# Patient Record
Sex: Female | Born: 1965 | Race: Black or African American | Hispanic: No | Smoking: Current some day smoker
Health system: Southern US, Community
[De-identification: ages and names within clinical notes are randomized; demographics above are authoritative.]

## PROBLEM LIST (undated history)

## (undated) DIAGNOSIS — I10 Essential (primary) hypertension: Secondary | ICD-10-CM

## (undated) HISTORY — DX: Essential (primary) hypertension: I10

---

## 2003-09-11 ENCOUNTER — Emergency Department (HOSPITAL_COMMUNITY): Admission: EM | Admit: 2003-09-11 | Discharge: 2003-09-11 | Payer: Self-pay | Admitting: Internal Medicine

## 2004-06-29 ENCOUNTER — Emergency Department (HOSPITAL_COMMUNITY): Admission: EM | Admit: 2004-06-29 | Discharge: 2004-06-29 | Payer: Self-pay | Admitting: Emergency Medicine

## 2004-07-08 ENCOUNTER — Ambulatory Visit (HOSPITAL_COMMUNITY): Admission: RE | Admit: 2004-07-08 | Discharge: 2004-07-08 | Payer: Self-pay | Admitting: Family Medicine

## 2004-07-24 ENCOUNTER — Ambulatory Visit: Payer: Self-pay | Admitting: Family Medicine

## 2005-07-20 ENCOUNTER — Ambulatory Visit: Payer: Self-pay | Admitting: Family Medicine

## 2005-07-30 ENCOUNTER — Ambulatory Visit (HOSPITAL_COMMUNITY): Admission: RE | Admit: 2005-07-30 | Discharge: 2005-07-30 | Payer: Self-pay | Admitting: Family Medicine

## 2005-09-02 ENCOUNTER — Ambulatory Visit (HOSPITAL_COMMUNITY): Admission: RE | Admit: 2005-09-02 | Discharge: 2005-09-02 | Payer: Self-pay | Admitting: Family Medicine

## 2006-04-14 ENCOUNTER — Encounter: Payer: Self-pay | Admitting: Family Medicine

## 2006-04-14 LAB — CONVERTED CEMR LAB: Pap Smear: NORMAL

## 2006-05-06 ENCOUNTER — Encounter: Payer: Self-pay | Admitting: Family Medicine

## 2006-05-06 ENCOUNTER — Other Ambulatory Visit: Admission: RE | Admit: 2006-05-06 | Discharge: 2006-05-06 | Payer: Self-pay | Admitting: Family Medicine

## 2006-05-06 ENCOUNTER — Ambulatory Visit: Payer: Self-pay | Admitting: Family Medicine

## 2006-08-09 ENCOUNTER — Ambulatory Visit (HOSPITAL_COMMUNITY): Admission: RE | Admit: 2006-08-09 | Discharge: 2006-08-09 | Payer: Self-pay | Admitting: Family Medicine

## 2006-10-20 ENCOUNTER — Ambulatory Visit: Payer: Self-pay | Admitting: Family Medicine

## 2007-11-17 ENCOUNTER — Ambulatory Visit: Payer: Self-pay | Admitting: Family Medicine

## 2007-11-19 ENCOUNTER — Encounter: Payer: Self-pay | Admitting: Family Medicine

## 2007-11-25 ENCOUNTER — Ambulatory Visit (HOSPITAL_COMMUNITY): Admission: RE | Admit: 2007-11-25 | Discharge: 2007-11-25 | Payer: Self-pay | Admitting: Family Medicine

## 2007-11-26 ENCOUNTER — Encounter: Payer: Self-pay | Admitting: Family Medicine

## 2007-11-26 LAB — CONVERTED CEMR LAB
BUN: 13 mg/dL (ref 6–23)
Basophils Absolute: 0 10*3/uL (ref 0.0–0.1)
Basophils Relative: 1 % (ref 0–1)
CO2: 22 meq/L (ref 19–32)
Calcium: 8.8 mg/dL (ref 8.4–10.5)
Chloride: 104 meq/L (ref 96–112)
Cholesterol: 178 mg/dL (ref 0–200)
Creatinine, Ser: 1.25 mg/dL — ABNORMAL HIGH (ref 0.40–1.20)
Eosinophils Absolute: 0.3 10*3/uL (ref 0.0–0.7)
Eosinophils Relative: 5 % (ref 0–5)
Glucose, Bld: 90 mg/dL (ref 70–99)
HCT: 39.6 % (ref 36.0–46.0)
HDL: 73 mg/dL (ref >39)
Hemoglobin: 13.1 g/dL (ref 12.0–15.0)
LDL Cholesterol: 96 mg/dL (ref 0–99)
Lymphocytes Relative: 39 % (ref 12–46)
Lymphs Abs: 2.3 10*3/uL (ref 0.7–4.0)
MCHC: 33.1 g/dL (ref 30.0–36.0)
MCV: 92.7 fL (ref 78.0–100.0)
Monocytes Absolute: 0.4 10*3/uL (ref 0.1–1.0)
Monocytes Relative: 6 % (ref 3–12)
Neutro Abs: 2.8 10*3/uL (ref 1.7–7.7)
Neutrophils Relative %: 49 % (ref 43–77)
Platelets: 331 10*3/uL (ref 150–400)
Potassium: 4.1 meq/L (ref 3.5–5.3)
RBC: 4.27 M/uL (ref 3.87–5.11)
RDW: 13 % (ref 11.5–15.5)
Sodium: 137 meq/L (ref 135–145)
Total CHOL/HDL Ratio: 2.4
Triglycerides: 47 mg/dL (ref <150)
VLDL: 9 mg/dL (ref 0–40)
WBC: 5.8 10*3/uL (ref 4.0–10.5)

## 2007-12-12 ENCOUNTER — Ambulatory Visit: Payer: Self-pay | Admitting: Family Medicine

## 2007-12-12 ENCOUNTER — Encounter: Payer: Self-pay | Admitting: Family Medicine

## 2007-12-12 ENCOUNTER — Other Ambulatory Visit: Admission: RE | Admit: 2007-12-12 | Discharge: 2007-12-12 | Payer: Self-pay | Admitting: Family Medicine

## 2007-12-12 LAB — CONVERTED CEMR LAB: Pap Smear: NORMAL

## 2007-12-14 ENCOUNTER — Encounter: Payer: Self-pay | Admitting: Family Medicine

## 2007-12-14 LAB — CONVERTED CEMR LAB
Candida species: NEGATIVE
Chlamydia, DNA Probe: NEGATIVE
GC Probe Amp, Genital: NEGATIVE
Gardnerella vaginalis: POSITIVE — AB
Trichomonal Vaginitis: NEGATIVE

## 2007-12-16 ENCOUNTER — Encounter: Payer: Self-pay | Admitting: Family Medicine

## 2007-12-16 DIAGNOSIS — J329 Chronic sinusitis, unspecified: Secondary | ICD-10-CM | POA: Insufficient documentation

## 2007-12-16 DIAGNOSIS — I10 Essential (primary) hypertension: Secondary | ICD-10-CM | POA: Insufficient documentation

## 2008-01-24 ENCOUNTER — Ambulatory Visit (HOSPITAL_COMMUNITY): Admission: RE | Admit: 2008-01-24 | Discharge: 2008-01-24 | Payer: Self-pay | Admitting: General Surgery

## 2008-05-29 ENCOUNTER — Ambulatory Visit: Payer: Self-pay | Admitting: Family Medicine

## 2008-07-31 ENCOUNTER — Ambulatory Visit: Payer: Self-pay | Admitting: Family Medicine

## 2008-12-17 ENCOUNTER — Encounter: Payer: Self-pay | Admitting: Family Medicine

## 2008-12-18 ENCOUNTER — Encounter: Payer: Self-pay | Admitting: Family Medicine

## 2008-12-18 LAB — CONVERTED CEMR LAB
BUN: 11 mg/dL (ref 6–23)
Basophils Absolute: 0 10*3/uL (ref 0.0–0.1)
Basophils Relative: 0 % (ref 0–1)
CO2: 23 meq/L (ref 19–32)
Calcium: 8.5 mg/dL (ref 8.4–10.5)
Chloride: 109 meq/L (ref 96–112)
Cholesterol: 177 mg/dL (ref 0–200)
Creatinine, Ser: 0.93 mg/dL (ref 0.40–1.20)
Eosinophils Absolute: 0.3 10*3/uL (ref 0.0–0.7)
Eosinophils Relative: 4 % (ref 0–5)
Glucose, Bld: 94 mg/dL (ref 70–99)
HCT: 34.6 % — ABNORMAL LOW (ref 36.0–46.0)
HDL: 62 mg/dL (ref >39)
Hemoglobin: 11.1 g/dL — ABNORMAL LOW (ref 12.0–15.0)
LDL Cholesterol: 99 mg/dL (ref 0–99)
Lymphocytes Relative: 34 % (ref 12–46)
Lymphs Abs: 2.3 10*3/uL (ref 0.7–4.0)
MCHC: 32.1 g/dL (ref 30.0–36.0)
MCV: 96.4 fL (ref 78.0–100.0)
Monocytes Absolute: 0.5 10*3/uL (ref 0.1–1.0)
Monocytes Relative: 7 % (ref 3–12)
Neutro Abs: 3.7 10*3/uL (ref 1.7–7.7)
Neutrophils Relative %: 55 % (ref 43–77)
Platelets: 351 10*3/uL (ref 150–400)
Potassium: 3.9 meq/L (ref 3.5–5.3)
RBC: 3.59 M/uL — ABNORMAL LOW (ref 3.87–5.11)
RDW: 14.1 % (ref 11.5–15.5)
Retic Ct Pct: 2.4 % (ref 0.4–3.1)
Sodium: 142 meq/L (ref 135–145)
Total CHOL/HDL Ratio: 2.9
Triglycerides: 79 mg/dL (ref <150)
VLDL: 16 mg/dL (ref 0–40)
WBC: 6.7 10*3/uL (ref 4.0–10.5)

## 2009-01-01 ENCOUNTER — Ambulatory Visit (HOSPITAL_COMMUNITY): Admission: RE | Admit: 2009-01-01 | Discharge: 2009-01-01 | Payer: Self-pay | Admitting: Family Medicine

## 2009-01-01 ENCOUNTER — Ambulatory Visit: Payer: Self-pay | Admitting: Family Medicine

## 2009-01-01 ENCOUNTER — Other Ambulatory Visit: Admission: RE | Admit: 2009-01-01 | Discharge: 2009-01-01 | Payer: Self-pay | Admitting: Family Medicine

## 2009-01-01 ENCOUNTER — Encounter: Payer: Self-pay | Admitting: Family Medicine

## 2009-01-01 DIAGNOSIS — D259 Leiomyoma of uterus, unspecified: Secondary | ICD-10-CM | POA: Insufficient documentation

## 2009-01-01 DIAGNOSIS — D492 Neoplasm of unspecified behavior of bone, soft tissue, and skin: Secondary | ICD-10-CM | POA: Insufficient documentation

## 2009-01-01 DIAGNOSIS — N760 Acute vaginitis: Secondary | ICD-10-CM | POA: Insufficient documentation

## 2009-01-01 LAB — CONVERTED CEMR LAB: OCCULT 1: NEGATIVE

## 2009-01-02 ENCOUNTER — Encounter: Payer: Self-pay | Admitting: Family Medicine

## 2009-01-02 LAB — CONVERTED CEMR LAB
Chlamydia, DNA Probe: NEGATIVE
GC Probe Amp, Genital: NEGATIVE

## 2009-01-04 ENCOUNTER — Encounter: Payer: Self-pay | Admitting: Family Medicine

## 2009-01-04 LAB — CONVERTED CEMR LAB
Candida species: NEGATIVE
Gardnerella vaginalis: POSITIVE — AB
Trichomonal Vaginitis: NEGATIVE

## 2009-01-07 ENCOUNTER — Ambulatory Visit (HOSPITAL_COMMUNITY): Admission: RE | Admit: 2009-01-07 | Discharge: 2009-01-07 | Payer: Self-pay | Admitting: Family Medicine

## 2009-01-11 ENCOUNTER — Ambulatory Visit (HOSPITAL_COMMUNITY): Admission: RE | Admit: 2009-01-11 | Discharge: 2009-01-11 | Payer: Self-pay | Admitting: Family Medicine

## 2009-06-13 ENCOUNTER — Ambulatory Visit: Payer: Self-pay | Admitting: Family Medicine

## 2009-10-07 ENCOUNTER — Telehealth: Payer: Self-pay | Admitting: Family Medicine

## 2010-01-17 ENCOUNTER — Ambulatory Visit (HOSPITAL_COMMUNITY): Admission: RE | Admit: 2010-01-17 | Discharge: 2010-01-17 | Payer: Self-pay | Admitting: Family Medicine

## 2010-01-17 LAB — CONVERTED CEMR LAB
BUN: 9 mg/dL (ref 6–23)
Basophils Absolute: 0 10*3/uL (ref 0.0–0.1)
Basophils Relative: 1 % (ref 0–1)
CO2: 25 meq/L (ref 19–32)
Calcium: 8.7 mg/dL (ref 8.4–10.5)
Chloride: 106 meq/L (ref 96–112)
Cholesterol: 176 mg/dL (ref 0–200)
Creatinine, Ser: 0.96 mg/dL (ref 0.40–1.20)
Eosinophils Absolute: 0.2 10*3/uL (ref 0.0–0.7)
Eosinophils Relative: 3 % (ref 0–5)
Glucose, Bld: 91 mg/dL (ref 70–99)
HCT: 35.7 % — ABNORMAL LOW (ref 36.0–46.0)
HDL: 65 mg/dL (ref >39)
Hemoglobin: 11.6 g/dL — ABNORMAL LOW (ref 12.0–15.0)
LDL Cholesterol: 102 mg/dL — ABNORMAL HIGH (ref 0–99)
Lymphocytes Relative: 41 % (ref 12–46)
Lymphs Abs: 2.4 10*3/uL (ref 0.7–4.0)
MCHC: 32.5 g/dL (ref 30.0–36.0)
MCV: 93.9 fL (ref 78.0–100.0)
Monocytes Absolute: 0.4 10*3/uL (ref 0.1–1.0)
Monocytes Relative: 6 % (ref 3–12)
Neutro Abs: 2.9 10*3/uL (ref 1.7–7.7)
Neutrophils Relative %: 49 % (ref 43–77)
Platelets: 383 10*3/uL (ref 150–400)
Potassium: 3.6 meq/L (ref 3.5–5.3)
RBC: 3.8 M/uL — ABNORMAL LOW (ref 3.87–5.11)
RDW: 13.4 % (ref 11.5–15.5)
Sodium: 140 meq/L (ref 135–145)
Total CHOL/HDL Ratio: 2.7
Triglycerides: 47 mg/dL (ref <150)
VLDL: 9 mg/dL (ref 0–40)
WBC: 5.9 10*3/uL (ref 4.0–10.5)

## 2010-02-06 ENCOUNTER — Other Ambulatory Visit: Admission: RE | Admit: 2010-02-06 | Discharge: 2010-02-06 | Payer: Self-pay | Admitting: Family Medicine

## 2010-02-06 ENCOUNTER — Ambulatory Visit: Payer: Self-pay | Admitting: Family Medicine

## 2010-02-06 DIAGNOSIS — E669 Obesity, unspecified: Secondary | ICD-10-CM | POA: Insufficient documentation

## 2010-02-06 DIAGNOSIS — R5381 Other malaise: Secondary | ICD-10-CM | POA: Insufficient documentation

## 2010-02-06 DIAGNOSIS — R5383 Other fatigue: Secondary | ICD-10-CM

## 2010-02-06 LAB — CONVERTED CEMR LAB: Pap Smear: NEGATIVE

## 2010-03-18 IMAGING — US US PELVIS COMPLETE MODIFY
1 series · 14 of 25 positions shown · non-contrast
Comparison: None

CLINICAL DATA: Fibroid.  LMP 01/06/2009

TRANSABDOMINAL AND TRANSVAGINAL ULTRASOUND OF PELVIS
TECHNIQUE: Both transabdominal and transvaginal ultrasound
examinations of the pelvis were performed including evaluation of
the uterus, ovaries, adnexal regions, and pelvic cul-de-sac.

[Series 1: us pelvis complete modify · 0.20mm/px · 14 of 63 slices shown]
[im 1/63]
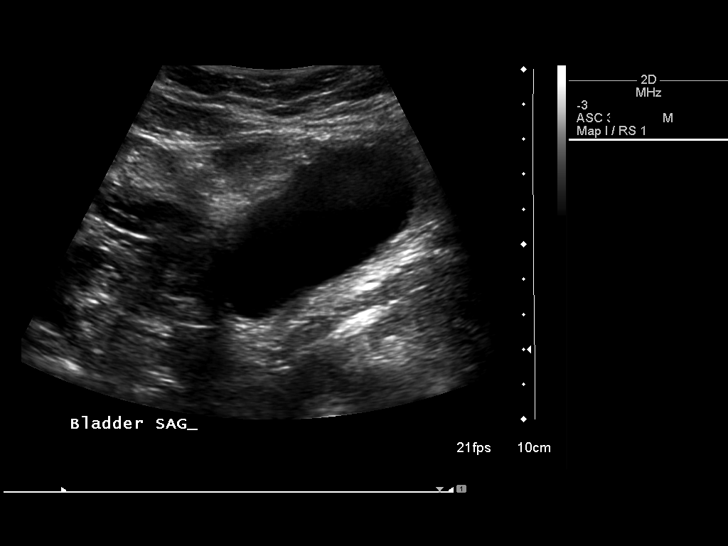
[im 6/63]
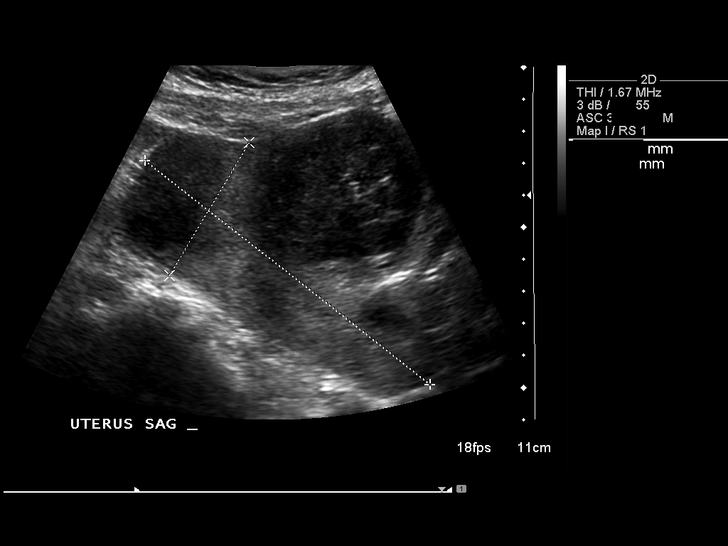
[im 11/63]
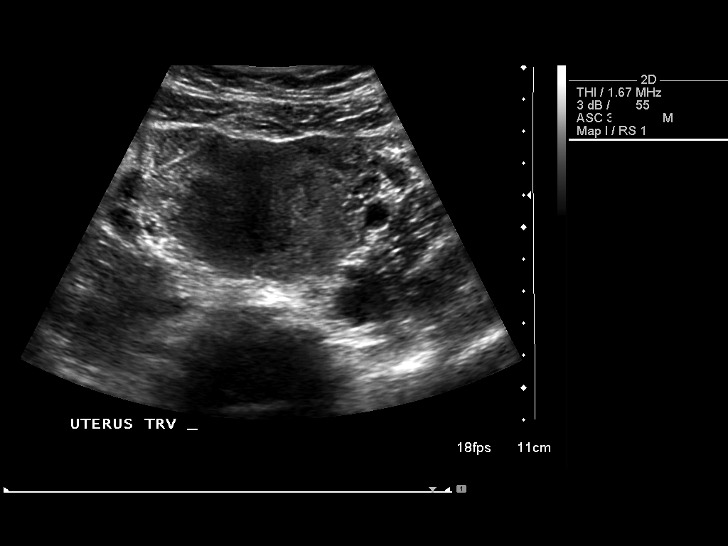
[im 16/63]
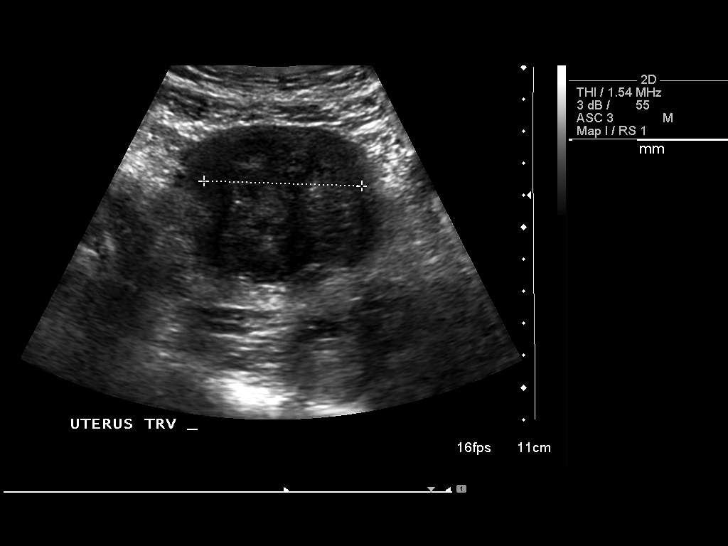
[im 21/63]
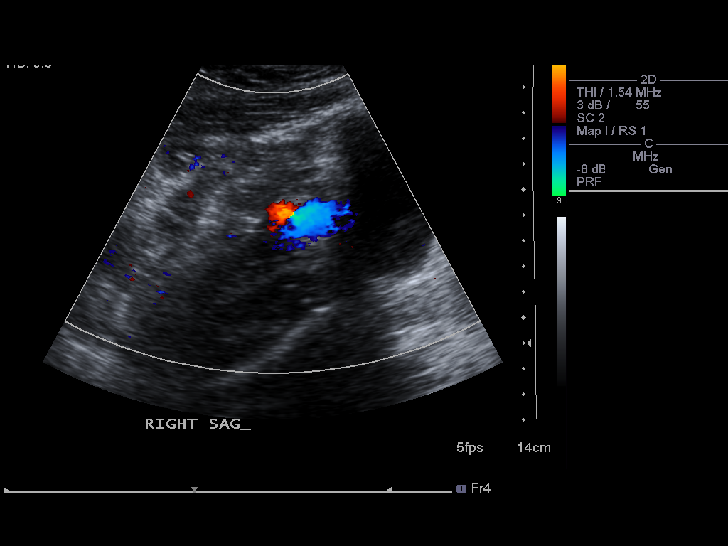
[im 24/63]
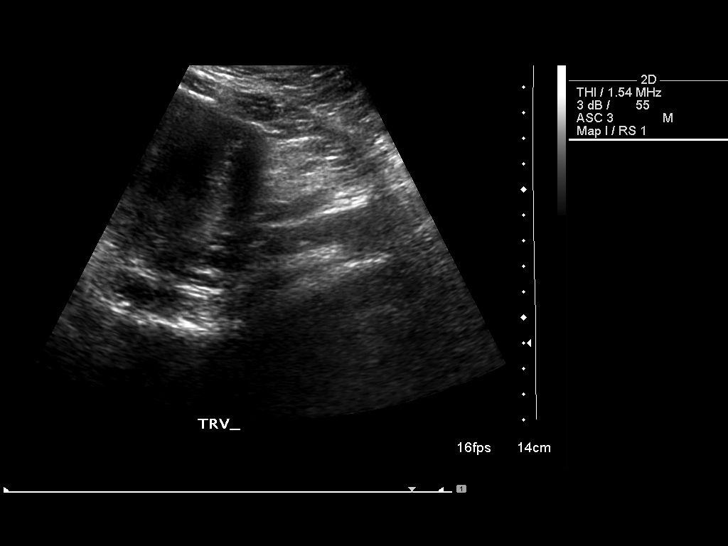
[im 29/63]
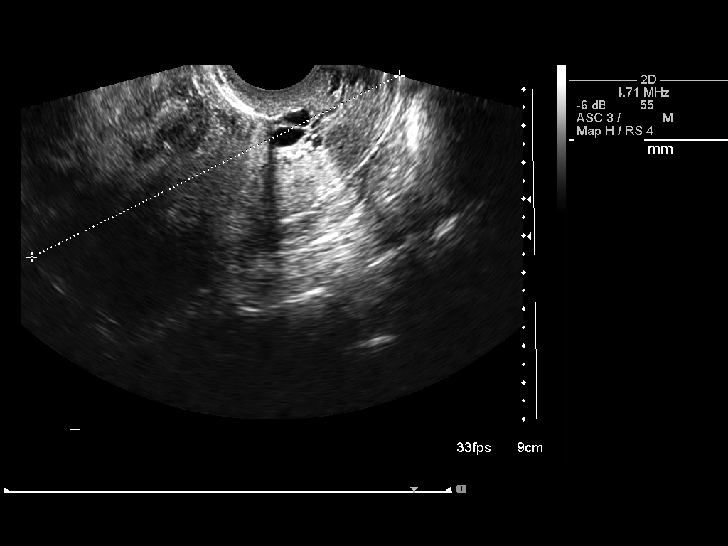
[im 34/63]
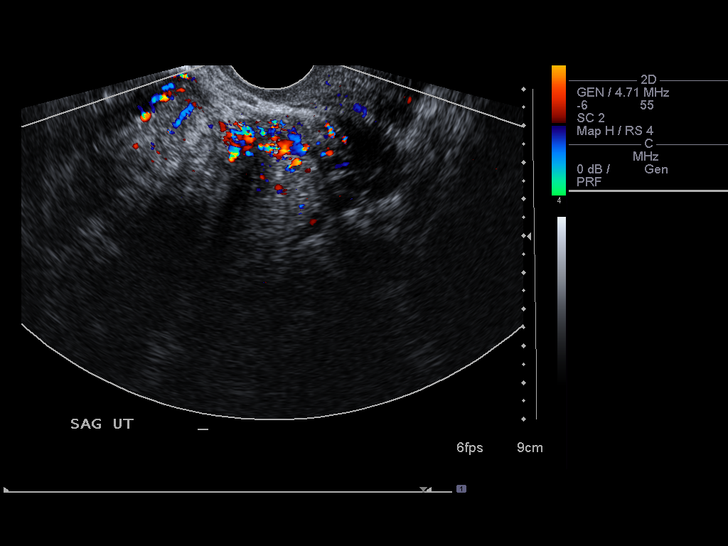
[im 39/63]
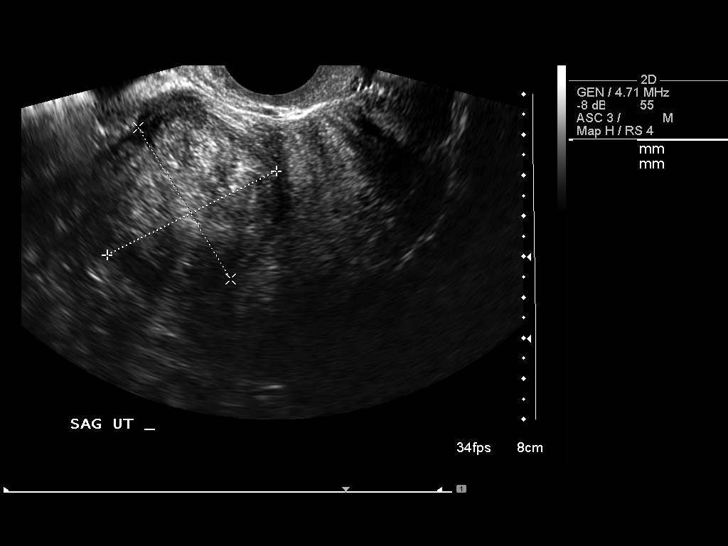
[im 42/63]
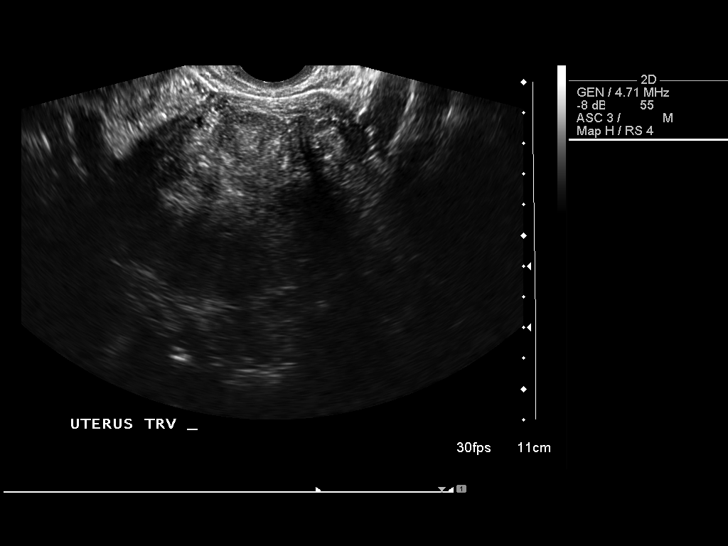
[im 47/63]
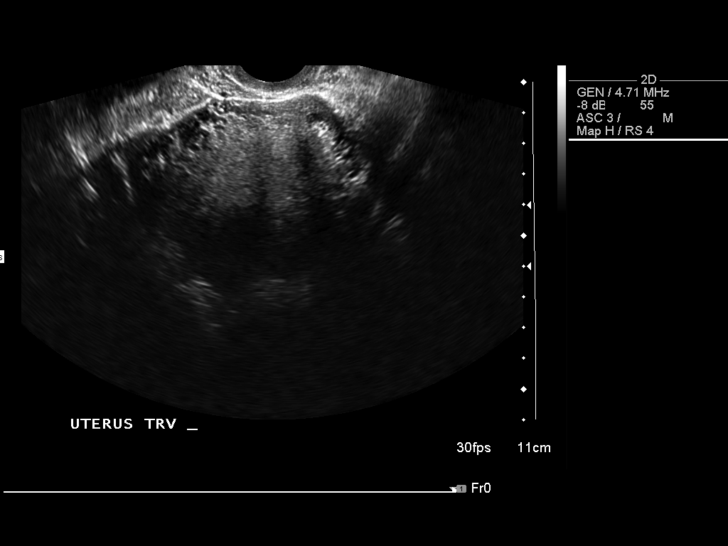
[im 52/63]
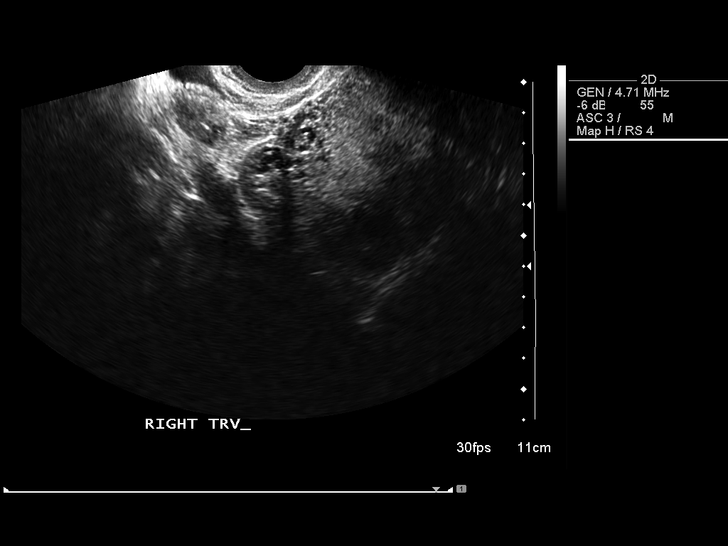
[im 57/63]
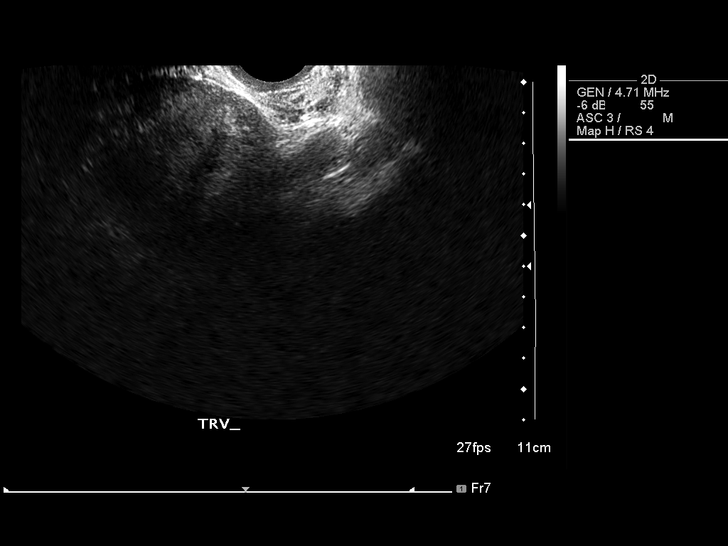
[im 63/63]
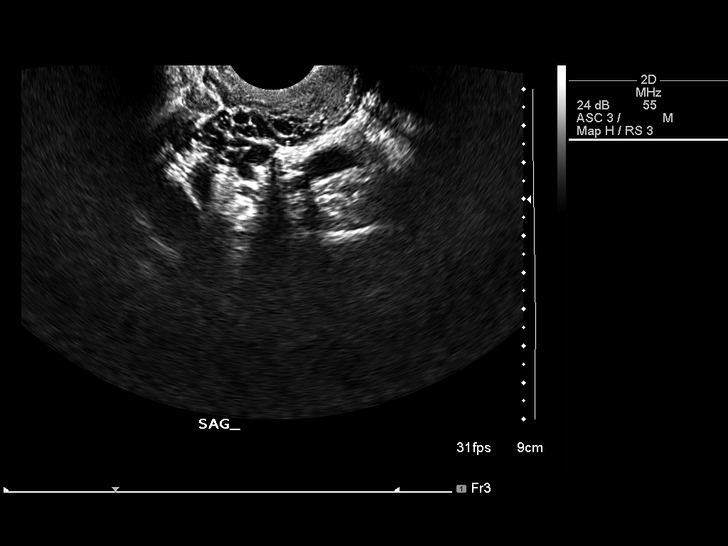

[14 of 25 positions shown; findings below may reference images not displayed]

FINDINGS: Uterus:  The uterus is 11.2 x 8.1 x 8.5 cm.  Anterior fibroid is
4.6 x 4.4 by 4.8 cm.

Endometrium:  The endometrium has a normal appearance and measures
1.3 mm.

Right Ovary:  The right ovary is not seen.

Left Ovary:  The left ovary is not seen.

Other Findings:  No free pelvic fluid is identified.
IMPRESSION: Anterior uterine fibroid.  Non-visualized ovaries.

## 2010-03-25 ENCOUNTER — Ambulatory Visit: Payer: Self-pay | Admitting: Family Medicine

## 2010-03-25 DIAGNOSIS — N3000 Acute cystitis without hematuria: Secondary | ICD-10-CM | POA: Insufficient documentation

## 2010-03-25 LAB — CONVERTED CEMR LAB
Bilirubin Urine: NEGATIVE
Glucose, Urine, Semiquant: NEGATIVE
Nitrite: NEGATIVE
Protein, U semiquant: >=300
Specific Gravity, Urine: 1.025
Urobilinogen, UA: 0.2
pH: 7

## 2010-03-27 ENCOUNTER — Encounter: Payer: Self-pay | Admitting: Family Medicine

## 2010-07-31 ENCOUNTER — Ambulatory Visit: Payer: Self-pay | Admitting: Family Medicine

## 2010-08-01 LAB — CONVERTED CEMR LAB
BUN: 13 mg/dL (ref 6–23)
CO2: 25 meq/L (ref 19–32)
Calcium: 9.3 mg/dL (ref 8.4–10.5)
Chloride: 106 meq/L (ref 96–112)
Creatinine, Ser: 0.91 mg/dL (ref 0.40–1.20)
Glucose, Bld: 88 mg/dL (ref 70–99)
Hgb A1c MFr Bld: 5.8 % — ABNORMAL HIGH (ref <5.7)
Potassium: 3.8 meq/L (ref 3.5–5.3)
Sodium: 139 meq/L (ref 135–145)

## 2010-10-02 ENCOUNTER — Ambulatory Visit
Admission: RE | Admit: 2010-10-02 | Discharge: 2010-10-02 | Payer: Self-pay | Source: Home / Self Care | Attending: Family Medicine | Admitting: Family Medicine

## 2010-10-05 ENCOUNTER — Encounter: Payer: Self-pay | Admitting: Family Medicine

## 2010-10-14 NOTE — Assessment & Plan Note (Signed)
Summary: office visit   Vital Signs:  Patient profile:   45 year old female Menstrual status:  regular Height:      64.5 inches Weight:      176 pounds BMI:     29.85 O2 Sat:      99 % Pulse rate:   66 / minute Pulse rhythm:   regular Resp:     16 per minute BP sitting:   134 / 80  (left arm) Cuff size:   regular  Vitals Entered By: Everitt Amber LPN (Feb 06, 2010 2:23 PM)  Nutrition Counseling: Patient's BMI is greater than 25 and therefore counseled on weight management options. CC: her allergies are making her itch on her back legs and arms off and on. Doesn't know if its pollen or something she's allergic too   CC:  her allergies are making her itch on her back legs and arms off and on. Doesn't know if its pollen or something she's allergic too.  History of Present Illness: Reports  that she ahs been doing well.She does feel overworked, and has not commited time to regular exercise, she still snacks on junkfood and has gained weight over the past several months. Denies recent fever or chills. Denies sinus pressure, nasal congestion , ear pain or sore throat. Denies chest congestion, or cough productive of sputum. Denies chest pain, palpitations, PND, orthopnea or leg swelling. Denies abdominal pain, nausea, vomitting, diarrhea or constipation. Denies change in bowel movements or bloody stool. Denies dysuria , frequency, incontinence or hesitancy. Denies  joint pain, swelling, or reduced mobility. Denies headaches, vertigo, seizures. Denies depression, anxiety or insomnia. Denies  rash or lesions, but reports generalised puritus when she goes outdoors, fweels this allergy based. Has used benadryl intermittently , with stmptom relief.   Allergies: No Known Drug Allergies  Review of Systems      See HPI General:  Complains of fatigue. Eyes:  Denies blurring and discharge. Derm:  Complains of itching; denies changes in color of skin, changes in nail beds, dryness,  lesion(s), and rash. Psych:  Denies anxiety and depression. Endo:  Denies cold intolerance, excessive thirst, excessive urination, and heat intolerance. Heme:  Denies abnormal bruising and bleeding. Allergy:  Denies hives or rash and itching eyes.  Physical Exam  General:  Well-developed,well-nourished,in no acute distress; alert,appropriate and cooperative throughout examination Head:  Normocephalic and atraumatic without obvious abnormalities. No apparent alopecia or balding. Eyes:  No corneal or conjunctival inflammation noted. EOMI. Perrla. Funduscopic exam benign, without hemorrhages, exudates or papilledema. Vision grossly normal. Ears:  External ear exam shows no significant lesions or deformities.  Otoscopic examination reveals clear canals, tympanic membranes are intact bilaterally without bulging, retraction, inflammation or discharge. Hearing is grossly normal bilaterally. Nose:  External nasal examination shows no deformity or inflammation. Nasal mucosa are pink and moist without lesions or exudates. Mouth:  Oral mucosa and oropharynx without lesions or exudates.  Teeth in good repair. Neck:  No deformities, masses, or tenderness noted. Chest Wall:  No deformities, masses, or tenderness noted. Breasts:  No mass, nodules, thickening, tenderness, bulging, retraction, inflamation, nipple discharge or skin changes noted.   Lungs:  Normal respiratory effort, chest expands symmetrically. Lungs are clear to auscultation, no crackles or wheezes. Heart:  Normal rate and regular rhythm. S1 and S2 normal without gallop, murmur, click, rub or other extra sounds. Abdomen:  Bowel sounds positive,abdomen soft and non-tender without masses, organomegaly or hernias noted. Rectal:  No external abnormalities noted. Normal sphincter tone.  No rectal masses or tenderness.Vault empty Genitalia:  Normal introitus for age, no external lesions, no vaginal discharge, mucosa pink and moist, no vaginal or  cervical lesions, no vaginal atrophy, no friaility or hemorrhage, normal uterus size and position, no adnexal masses or tenderness Msk:  No deformity or scoliosis noted of thoracic or lumbar spine.   Pulses:  R and L carotid,radial,femoral,dorsalis pedis and posterior tibial pulses are full and equal bilaterally Extremities:  No clubbing, cyanosis, edema, or deformity noted with normal full range of motion of all joints.   Neurologic:  No cranial nerve deficits noted. Station and gait are normal. Plantar reflexes are down-going bilaterally. DTRs are symmetrical throughout. Sensory, motor and coordinative functions appear intact. Skin:  Intact without suspicious lesions or rashes Cervical Nodes:  No lymphadenopathy noted Axillary Nodes:  No palpable lymphadenopathy Inguinal Nodes:  No significant adenopathy Psych:  Cognition and judgment appear intact. Alert and cooperative with normal attention span and concentration. No apparent delusions, illusions, hallucinations   Impression & Recommendations:  Problem # 1:  HYPERTENSION (ICD-401.9) Assessment Deteriorated  Her updated medication list for this problem includes:    Maxzide-25 37.5-25 Mg Tabs (Triamterene-hctz) .Marland Kitchen... Take 1 tablet by mouth once a day  Orders: T-Basic Metabolic Panel [id number])  BP today: 134/80 Prior BP: 110/70 (06/13/2009)  Labs Reviewed: K+: 3.6 (01/17/2010) Creat: : 0.96 (01/17/2010)   Chol: 176 (01/17/2010)   HDL: 65 (01/17/2010)   LDL: 102 (01/17/2010)   TG: 47 (01/17/2010)  Problem # 2:  OVERWEIGHT (ICD-278.02) Assessment: Deteriorated  Ht: 64.5 (02/06/2010)   Wt: 176 (02/06/2010)   BMI: 29.85 (02/06/2010)  Problem # 3:  SPECIAL SCREENING FOR MALIGNANT NEOPLASMS COLON (ICD-V76.51) Assessment: Comment Only pt toreturn stool cards, rectal vaul;t wasempty  Complete Medication List: 1)  Maxzide-25 37.5-25 Mg Tabs (Triamterene-hctz) .... Take 1 tablet by mouth once a day  Other Orders: T-Lipid Profile  ([id number]) T-CBC w/Diff [phone]) T-TSH [phone]) T-Vitamin D (25-Hydroxy) [phone]) Pap Smear (02725)  Patient Instructions: 1)  F/U in 5 months and 3 weeks 2)  BMP prior to visit, ICD-9: 3)  Lipid Panel prior to visit, ICD-9:  fasting in 5.5 months 4)  CBC w/ Diff prior to visit, ICD-9: and anemia panel. 5)  vitamin D 6)  TSH  7)  Pls start oTC vitamin B12 one tab daily, also ensure you take one multivitamin dailyl 8)  It is important that you exercise regularly at least 20 minutes 5 times a week. If you develop chest pain, have severe difficulty breathing, or feel very tired , stop exercising immediately and seek medical attention. 9)  You need to lose weight. Consider a lower calorie diet and regular exercise.  Prescriptions: MAXZIDE-25 37.5-25 MG TABS (TRIAMTERENE-HCTZ) Take 1 tablet by mouth once a day  #30 x 4   Entered by:   Everitt Amber LPN   Authorized by:   Syliva Overman MD   Signed by:   Everitt Amber LPN on 36/64/4034   Method used:   [facility] to        [facility] [phone]* (retail)       [address]       [place], [place]  [address]       Ph: [phone] or [phone]       Fax: [phone]   RxID:   [id number]

## 2010-10-14 NOTE — Assessment & Plan Note (Signed)
Summary: uti?- room 1   Vital Signs:  Patient profile:   45 year old female Menstrual status:  regular Height:      64.5 inches Weight:      167.75 pounds BMI:     28.45 O2 Sat:      100 % on Room air Pulse rate:   89 / minute Resp:     16 per minute BP sitting:   110 / 70  (left arm)  Vitals Entered By: Adella Hare LPN (March 25, 2010 3:41 PM) CC: burning with urination, increased frequency Is Patient Diabetic? No Pain Assessment Patient in pain? no      Comments did not bring meds to ov   CC:  burning with urination and increased frequency.  History of Present Illness: Pt states since she awoke this am she has developed urinary freq and burning.  Worsening as the day goes on.  No fever or chills.  She has been getting some intermittent discomfort in her lower abd too.  No back pain. No hx of recurrent UTI's.  Hx of htn. Taking medications as prescribed. No headache, chest pain or palpitations.   Allergies (verified): No Known Drug Allergies  Past History:  Past medical history reviewed for relevance to current acute and chronic problems.  Past Medical History: Reviewed history from 12/16/2007 and no changes required. Current Problems:  OBESITY (ICD-278.00) SINUSITIS (ICD-473.9) HYPERTENSION (ICD-401.9)  Review of Systems General:  Denies chills and fever. CV:  Denies chest pain or discomfort and palpitations. Resp:  Denies shortness of breath. GI:  Complains of abdominal pain; denies change in bowel habits, nausea, and vomiting. GU:  Complains of dysuria and urinary frequency. MS:  Denies low back pain and mid back pain.  Physical Exam  General:  Well-developed,well-nourished,in no acute distress; alert,appropriate and cooperative throughout examination Head:  Normocephalic and atraumatic without obvious abnormalities. No apparent alopecia or balding. Ears:  External ear exam shows no significant lesions or deformities.  Otoscopic examination reveals  clear canals, tympanic membranes are intact bilaterally without bulging, retraction, inflammation or discharge. Hearing is grossly normal bilaterally. Nose:  External nasal examination shows no deformity or inflammation. Nasal mucosa are pink and moist without lesions or exudates. Mouth:  Oral mucosa and oropharynx without lesions or exudates.   Neck:  No deformities, masses, or tenderness noted. Lungs:  Normal respiratory effort, chest expands symmetrically. Lungs are clear to auscultation, no crackles or wheezes. Heart:  Normal rate and regular rhythm. S1 and S2 normal without gallop, murmur, click, rub or other extra sounds. Abdomen:  soft, no masses, no guarding, and no rigidity.  TTP suprapubic. Msk:  Neg [place] Cervical Nodes:  No lymphadenopathy noted Psych:  Cognition and judgment appear intact. Alert and cooperative with normal attention span and concentration. No apparent delusions, illusions, hallucinations   Impression & Recommendations:  Problem # 1:  CYSTITIS, ACUTE (ICD-595.0) Assessment New  Her updated medication list for this problem includes:    Sulfamethoxazole-tmp Ds 800-160 Mg Tabs (Sulfamethoxazole-trimethoprim) .Marland Kitchen... Take 1 two times a day x 3 days    Phenazopyridine Hcl 100 Mg Tabs (Phenazopyridine hcl) .Marland Kitchen... Take 1 every 8 hrs as needed for burining with urination  Problem # 2:  HYPERTENSION (ICD-401.9) Assessment: Comment Only  Her updated medication list for this problem includes:    Maxzide-25 37.5-25 Mg Tabs (Triamterene-hctz) .Marland Kitchen... Take 1 tablet by mouth once a day  Complete Medication List: 1)  Maxzide-25 37.5-25 Mg Tabs (Triamterene-hctz) .... Take 1 tablet by mouth  once a day 2)  Sulfamethoxazole-tmp Ds 800-160 Mg Tabs (Sulfamethoxazole-trimethoprim) .... Take 1 two times a day x 3 days 3)  Phenazopyridine Hcl 100 Mg Tabs (Phenazopyridine hcl) .... Take 1 every 8 hrs as needed for burining with urination  Other Orders: Urinalysis  ([id number])  Patient Instructions: 1)  Keep your next appt.  We will see you sooner if your syptoms dont improve or worsen. 2)  I have prescribed an antibiotic for your urinary infection and a medication to help with the burning. 3)  Increase your fluids. Prescriptions: PHENAZOPYRIDINE HCL 100 MG TABS (PHENAZOPYRIDINE HCL) take 1 every 8 hrs as needed for burining with urination  #6 x 0   Entered and Authorized by:   Esperanza Sheets PA   Signed by:   Esperanza Sheets PA on 03/25/2010   Method used:   Electronically to        [facility]. [phone]* (retail)       [address]       [place], [place]  [address]       Ph: [phone] or [phone]       Fax: [phone]   RxID:   [id number] SULFAMETHOXAZOLE-TMP DS 800-160 MG TABS (SULFAMETHOXAZOLE-TRIMETHOPRIM) take 1 two times a day x 3 days  #6 x 0   Entered and Authorized by:   Esperanza Sheets PA   Signed by:   Esperanza Sheets PA on 03/25/2010   Method used:   Electronically to        [facility]. [phone]* (retail)       [address]       [place], [place]  [address]       Ph: [phone] or [phone]       Fax: [phone]   RxID:   [id number]   Laboratory Results   Urine Tests  Date/Time Received: March 25, 2010 3:48 PM  Date/Time Reported: March 25, 2010 3:48 PM   Routine Urinalysis   Color: red Appearance: Cloudy Glucose: negative   (Normal Range: Negative) Bilirubin: negative   (Normal Range: Negative) Ketone: moderate (40)   (Normal Range: Negative) Spec. Gravity: 1.025   (Normal Range: 1.003-1.035) Blood: large   (Normal Range: Negative) pH: 7.0   (Normal Range: 5.0-8.0) Protein: >=300   (Normal Range: Negative) Urobilinogen: 0.2   (Normal Range: 0-1) Nitrite: negative   (Normal Range: Negative) Leukocyte Esterace: moderate   (Normal Range: Negative)       Appended Document: uti?- room 1

## 2010-10-14 NOTE — Assessment & Plan Note (Signed)
Summary: F UP   Vital Signs:  Patient profile:   45 year old female Menstrual status:  regular Height:      64.5 inches Weight:      171.75 pounds BMI:     29.13 O2 Sat:      99 % on Room air Pulse rate:   68 / minute Pulse rhythm:   regular Resp:     16 per minute BP sitting:   150 / 70  (right arm)  Vitals Entered By: Mauricia Area CMA (July 31, 2010 4:01 PM)  Nutrition Counseling: Patient's BMI is greater than 25 and therefore counseled on weight management options.  O2 Flow:  Room air CC: Follow up   CC:  Follow up.  History of Present Illness: Reports  that she has been doing well. She iss aware that she isnot exercising as she should and is also eating more junk food, with deterioration in her health Denies recent fever or chills. Denies sinus pressure, nasal congestion , ear pain or sore throat. Denies chest congestion, or cough productive of sputum. Denies chest pain, palpitations, PND, orthopnea or leg swelling. Denies abdominal pain, nausea, vomitting, diarrhea or constipation. Denies change in bowel movements or bloody stool. Denies dysuria , frequency, incontinence or hesitancy. Denies  joint pain, swelling, or reduced mobility. Denies headaches, vertigo, seizures. Denies depression, anxiety or insomnia. Denies  rash, lesions, or itch.     Current Medications (verified): 1)  Maxzide-25 37.5-25 Mg Tabs (Triamterene-Hctz) .... Take 1 Tablet By Mouth Once A Day  Allergies (verified): No Known Drug Allergies  Review of Systems      See HPI General:  Complains of fatigue. Eyes:  Denies discharge, eye pain, and red eye. Endo:  Denies cold intolerance, excessive thirst, excessive urination, and heat intolerance. Heme:  Denies abnormal bruising and bleeding. Allergy:  Denies hives or rash and itching eyes.  Physical Exam  General:  Well-developed,well-nourished,in no acute distress; alert,appropriate and cooperative throughout examination HEENT: No  facial asymmetry,  EOMI, No sinus tenderness, TM's Clear, oropharynx  pink and moist.   Chest: Clear to auscultation bilaterally.  CVS: S1, S2, No murmurs, No S3.   Abd: Soft, Nontender.  MS: Adequate ROM spine, hips, shoulders and knees.  Ext: No edema.   CNS: CN 2-12 intact, power tone and sensation normal throughout.   Skin: Intact, no visible lesions or rashes.  Psych: Good eye contact, normal affect.  Memory intact, not anxious or depressed appearing.    Impression & Recommendations:  Problem # 1:  OVERWEIGHT (ICD-278.02) Assessment Deteriorated  Ht: 64.5 (07/31/2010)   Wt: 171.75 (07/31/2010)   BMI: 29.13 (07/31/2010) therapeutic lifestyle change discussed and encouraged  Problem # 2:  HYPERTENSION (ICD-401.9) Assessment: Deteriorated  Her updated medication list for this problem includes:    Maxzide-25 37.5-25 Mg Tabs (Triamterene-hctz) .Marland Kitchen... Take 1 tablet by mouth once a day  Orders: T-Basic Metabolic Panel [phone])  BP today: 150/70 Prior BP: 110/70 (03/25/2010) Patient advised to follow low sodium diet rich in fruit and vegetables, and to commit to at least 30 minutes 5 days per week of regular exercise , to improve blood presure control.   Labs Reviewed: K+: 3.6 (01/17/2010) Creat: : 0.96 (01/17/2010)   Chol: 176 (01/17/2010)   HDL: 65 (01/17/2010)   LDL: 102 (01/17/2010)   TG: 47 (01/17/2010)  Complete Medication List: 1)  Maxzide-25 37.5-25 Mg Tabs (Triamterene-hctz) .... Take 1 tablet by mouth once a day  Other Orders: T- Hemoglobin A1C ([id number])  Patient Instructions: 1)  Please schedule a follow-up appointment in 2 months months. 2)  It is important that you exercise regularly at least 20 minutes 5 times a week. If you develop chest pain, have severe difficulty breathing, or feel very tired , stop exercising immediately and seek medical attention. 3)  You need to lose weight. Consider a lower calorie diet and regular exercise.  4)  pLS increase  the amt of fruit and vegetables you eat and follow the DASH diet 5)  BMP prior to visit, ICD-9:   today 6)  HbgA1C prior to visit, ICD-9: 7)  bP ishigh today, 150/70, it it impt that you cut out junk foodand start eating more fruit and veg Prescriptions: MAXZIDE-25 37.5-25 MG TABS (TRIAMTERENE-HCTZ) Take 1 tablet by mouth once a day  #30 x 4   Entered by:   Adella Hare LPN   Authorized by:   Syliva Overman MD   Signed by:   Adella Hare LPN on 16/06/9603   Method used:   Electronically to        [facility]. [phone]* (retail)       [address]       [place], [place]  [address]       Ph: [phone] or [phone]       Fax: [phone]   RxID:   [id number]    Orders Added: 1)  Est. Patient Level IV [74259] 2)  T-Basic Metabolic Panel [[id number]] 3)  T- Hemoglobin A1C [83036-23375]

## 2010-10-14 NOTE — Progress Notes (Signed)
Summary: refill  Phone Note Call from Patient   Summary of Call: needs to get refill on her med. she was in last month [phone]  [phone] Initial call taken by: Rudene Anda,  October 07, 2009 10:59 AM  Follow-up for Phone Call        returned call, tried both numbers, no answer Follow-up by: Worthy Keeler LPN,  October 07, 2009 11:12 AM  Additional Follow-up for Phone Call Additional follow up Details #1::        Rx Called In Additional Follow-up by: Worthy Keeler LPN,  October 07, 2009 11:13 AM

## 2010-10-22 NOTE — Assessment & Plan Note (Signed)
Summary: f up   Vital Signs:  Patient profile:   45 year old female Menstrual status:  regular Height:      64.5 inches Weight:      174.25 pounds BMI:     29.55 O2 Sat:      98 % Pulse rate:   83 / minute Pulse rhythm:   regular Resp:     16 per minute BP sitting:   108 / 72  (left arm) Cuff size:   regular  Vitals Entered By: Everitt Amber LPN (October 02, 2010 4:04 PM)  Nutrition Counseling: Patient's BMI is greater than 25 and therefore counseled on weight management options. CC: Follow up chronic problems   CC:  Follow up chronic problems.  History of Present Illness: Reports  that she has been oing well. Denies recent fever or chills. Denies sinus pressure, nasal congestion , ear pain or sore throat. Denies chest congestion, or cough productive of sputum. Denies chest pain, palpitations, PND, orthopnea or leg swelling. Denies abdominal pain, nausea, vomitting, diarrhea or constipation. Denies change in bowel movements or bloody stool. Denies dysuria , frequency, incontinence or hesitancy. Denies  joint pain, swelling, or reduced mobility. Denies headaches, vertigo, seizures. Denies depression, anxiety or insomnia. Denies  rash, lesions, or itch.     Current Medications (verified): 1)  Maxzide-25 37.5-25 Mg Tabs (Triamterene-Hctz) .... Take 1 Tablet By Mouth Once A Day  Allergies (verified): No Known Drug Allergies  Review of Systems      See HPI Eyes:  Denies discharge, eye pain, and red eye. Endo:  Denies cold intolerance, excessive hunger, excessive thirst, excessive urination, and heat intolerance. Heme:  Denies abnormal bruising and bleeding. Allergy:  Denies hives or rash and itching eyes.  Physical Exam  General:  Well-developed,well-nourished,in no acute distress; alert,appropriate and cooperative throughout examination HEENT: No facial asymmetry,  EOMI, No sinus tenderness, TM's Clear, oropharynx  pink and moist.   Chest: Clear to auscultation  bilaterally.  CVS: S1, S2, No murmurs, No S3.   Abd: Soft, Nontender.  MS: Adequate ROM spine, hips, shoulders and knees.  Ext: No edema.   CNS: CN 2-12 intact, power tone and sensation normal throughout.   Skin: Intact, no visible lesions or rashes.  Psych: Good eye contact, normal affect.  Memory intact, not anxious or depressed appearing.    Impression & Recommendations:  Problem # 1:  OVERWEIGHT (ICD-278.02) Assessment Unchanged  Ht: 64.5 (10/02/2010)   Wt: 174.25 (10/02/2010)   BMI: 29.55 (10/02/2010) therapeutic lifestyle change discussed and encouraged  Problem # 2:  HYPERTENSION (ICD-401.9) Assessment: Improved  The following medications were removed from the medication list:    Maxzide-25 37.5-25 Mg Tabs (Triamterene-hctz) .Marland Kitchen... Take 1 tablet by mouth once a day Her updated medication list for this problem includes:    Triamterene-hctz 37.5-25 Mg Tabs (Triamterene-hctz) ..... One tab by mouth once daily  Orders: T-Basic Metabolic Panel [id number])  BP today: 108/72 Prior BP: 150/70 (07/31/2010)  Labs Reviewed: K+: 3.8 (07/31/2010) Creat: : 0.91 (07/31/2010)   Chol: 176 (01/17/2010)   HDL: 65 (01/17/2010)   LDL: 102 (01/17/2010)   TG: 47 (01/17/2010)  Complete Medication List: 1)  Triamterene-hctz 37.5-25 Mg Tabs (Triamterene-hctz) .... One tab by mouth once daily  Other Orders: T-CBC w/Diff [id number]) T- Hemoglobin A1C ([id number]) T-TSH [id number]) T-Lipid Profile ([id number])  Patient Instructions: 1)  CPE in  2)  may 27th or after 3)  It is important that you exercise regularly at least 40 minutes 5 times  a week. If you develop chest pain, have severe difficulty breathing, or feel very tired , stop exercising immediately and seek medical attention. 4)  You need to lose weight. Consider a lower calorie diet and regular exercise.  5)  CBC w/ Diff prior to visit, ICD-9: 6)  BMP prior to visit, ICD-9: 7)  Lipid Panel prior to visit, ICD-9:   fasting in May 8)  TSH prior to visit, ICD-9: 9)  HbgA1C prior to visit, ICD-9: 10)  Nop med changes at thistime Prescriptions: TRIAMTERENE-HCTZ 37.5-25 MG TABS (TRIAMTERENE-HCTZ) one tab by mouth once daily  #90 x 1   Entered by:   Adella Hare LPN   Authorized by:   Syliva Overman MD   Signed by:   Adella Hare LPN on 64/40/3474   Method used:   [facility]ronically to        [facility]. [phone]* (retail)       [address]       [place], [place]  [address]       Ph: [phone]       Fax: [phone]   RxID:   [id number]    Orders Added: 1)  Est. Patient Level IV [42706] 2)  T-CBC w/Diff [[id number]] 3)  T-Basic Metabolic Panel [[id number]] 4)  T- Hemoglobin A1C [83036-23375] 5)  T-TSH [[id number]] 6)  T-Lipid Profile [[id number]]

## 2011-01-27 ENCOUNTER — Other Ambulatory Visit: Payer: Self-pay | Admitting: Family Medicine

## 2011-01-27 DIAGNOSIS — Z139 Encounter for screening, unspecified: Secondary | ICD-10-CM

## 2011-01-27 NOTE — H&P (Signed)
NAME:  Grace Valdez, Grace Valdez                ACCOUNT NO.:  [id number]   MEDICAL RECORD NO.:  [id number]          PATIENT TYPE:  AMB   LOCATION:  DAY                           FACILITY:  [facility]   PHYSICIAN:  Dalia Heading, M.D.  DATE OF BIRTH:  12/06/1965   DATE OF ADMISSION:  DATE OF DISCHARGE:  LH                              HISTORY & PHYSICAL   CHIEF COMPLAINT:  Hematochezia.   HISTORY OF PRESENT ILLNESS:  The patient is a 45 year old black female  who was referred for endoscopic evaluation.  She needs a colonoscopy for  hematochezia.  She was known to have blood in her stools.  No abdominal  pain, weight loss, nausea, vomiting, diarrhea, constipation, or melena.  She has never had a colonoscopy.  There is no family history of colon  carcinoma.   PAST MEDICAL HISTORY:  Includes hypertension.   PAST SURGICAL HISTORY:  Unremarkable.   CURRENT MEDICATIONS:  Benicar.   ALLERGIES:  No known drug allergies.   REVIEW OF SYSTEMS:  Noncontributory.   PHYSICAL EXAMINATION:  GENERAL:  The patient is a well-developed and  well-nourished black female, in no acute distress.  LUNGS:  Clear to auscultation with equal breath sounds bilaterally.  HEART:  Reveals regular rate and rhythm without S3, S4, or murmurs.  ABDOMEN:  Soft, nontender, and nondistended.  No hepatosplenomegaly or  masses are noted.  RECTAL:  Deferred to the procedure.   IMPRESSION:  Hematochezia.   PLAN:  The patient is scheduled for a colonoscopy on Jan 24, 2008.  The  risks and benefits of the procedure including bleeding and perforation  were fully explained to the patient, gave informed consent.      Dalia Heading, M.D.  Electronically Signed     MAJ/MEDQ  D:  01/05/2008  T:  01/05/2008  Job:  [id number]   cc:   Milus Mallick. Lodema Hong, M.D.  Fax: [phone]

## 2011-01-30 ENCOUNTER — Ambulatory Visit (HOSPITAL_COMMUNITY)
Admission: RE | Admit: 2011-01-30 | Discharge: 2011-01-30 | Disposition: A | Discharge status: Home / Self Care | Payer: BC Managed Care – PPO | Source: Ambulatory Visit | Attending: Family Medicine | Admitting: Family Medicine

## 2011-01-30 DIAGNOSIS — Z139 Encounter for screening, unspecified: Secondary | ICD-10-CM

## 2011-01-30 DIAGNOSIS — Z1231 Encounter for screening mammogram for malignant neoplasm of breast: Secondary | ICD-10-CM | POA: Insufficient documentation

## 2011-03-28 IMAGING — MG MM DIGITAL SCREENING
4 series · 4 of 4 positions shown · non-contrast
Comparison: none

DG SCREEN MAMMOGRAM BILATERAL
Bilateral CC and MLO view(s) were taken.

DIGITAL SCREENING MAMMOGRAM WITH CAD:
The breast tissue is heterogeneously dense.  No masses or malignant type calcifications are 
identified.  Compared with prior studies.
Images were processed with CAD.

[L CC]
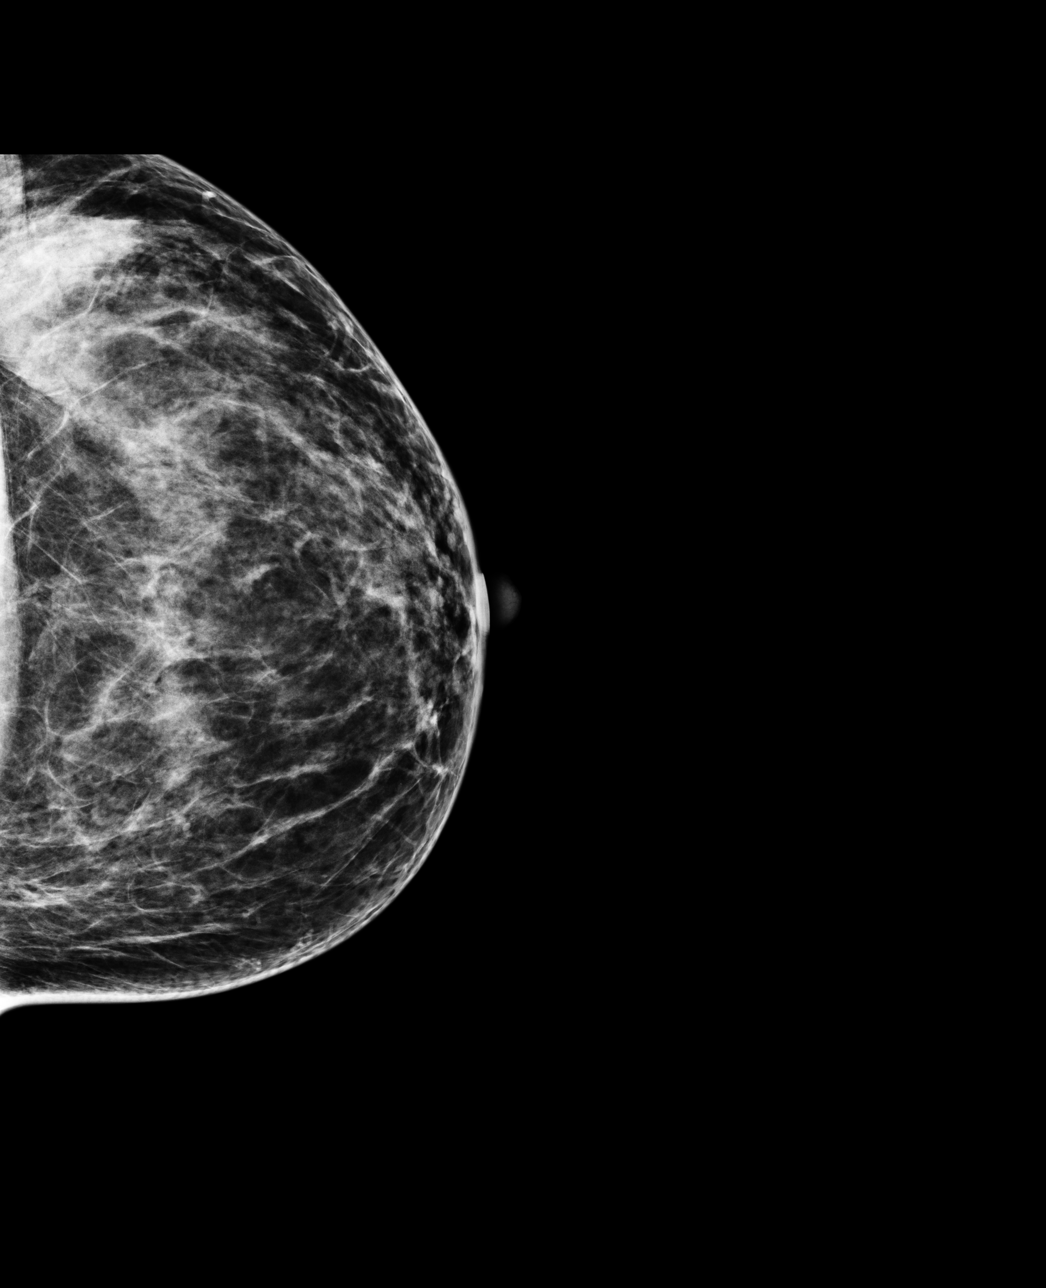

[L MLO]
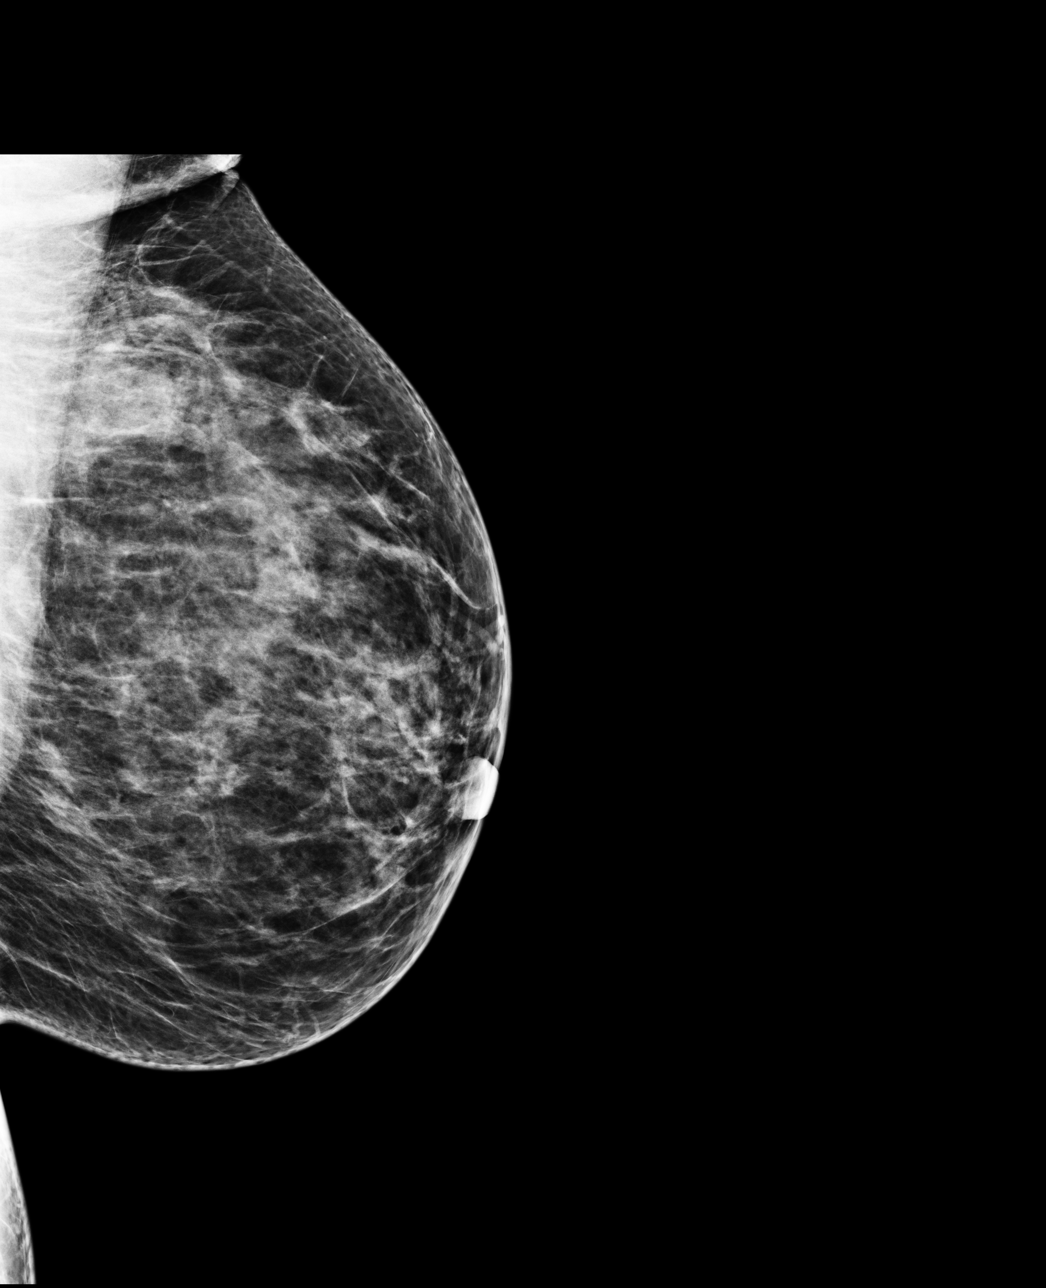

[R CC]
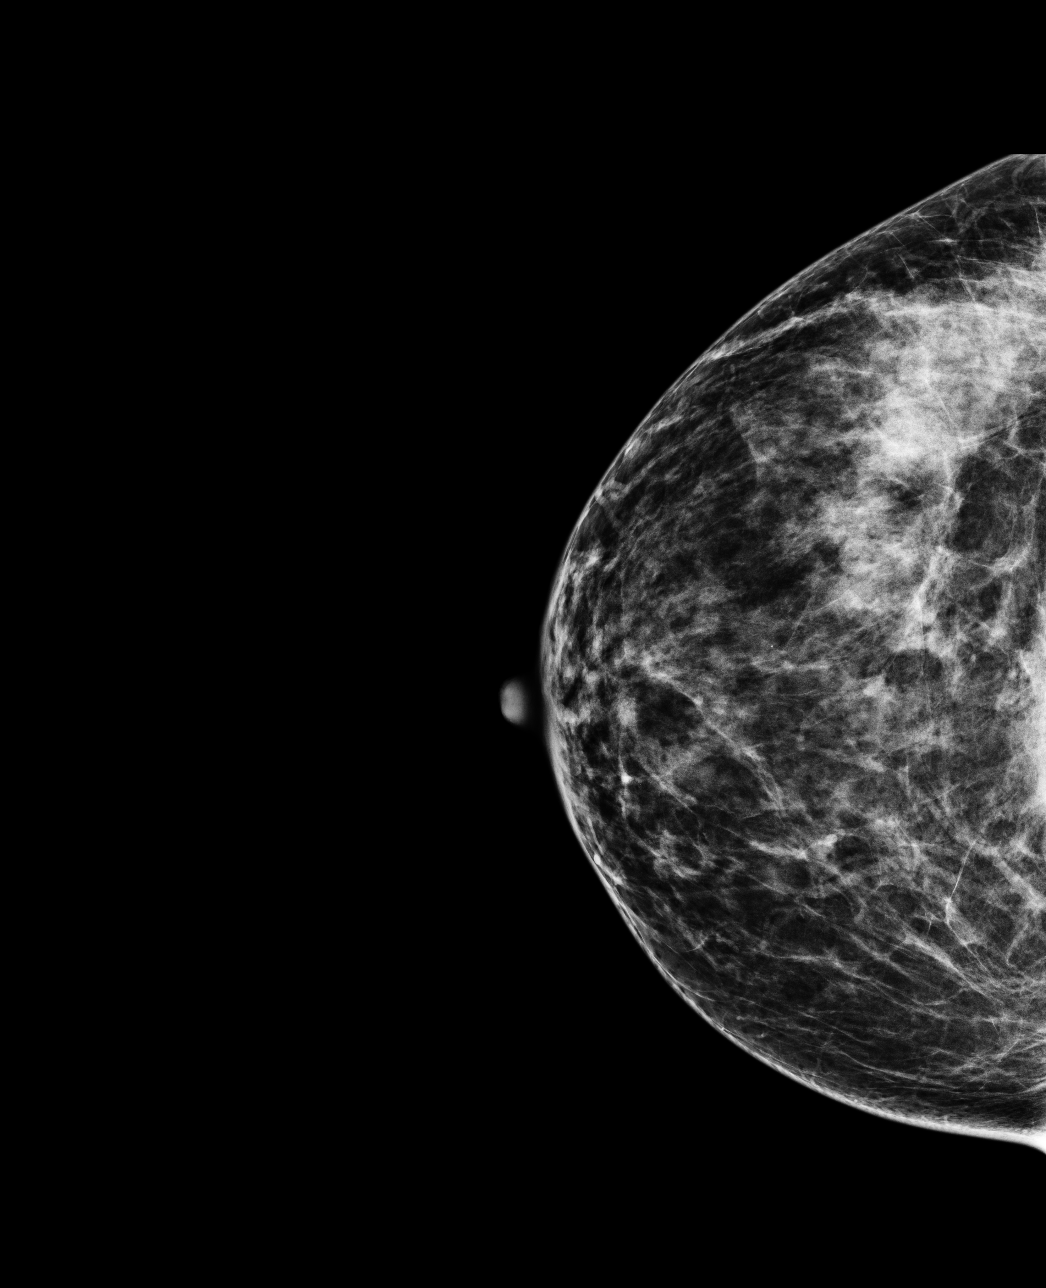

[R MLO]
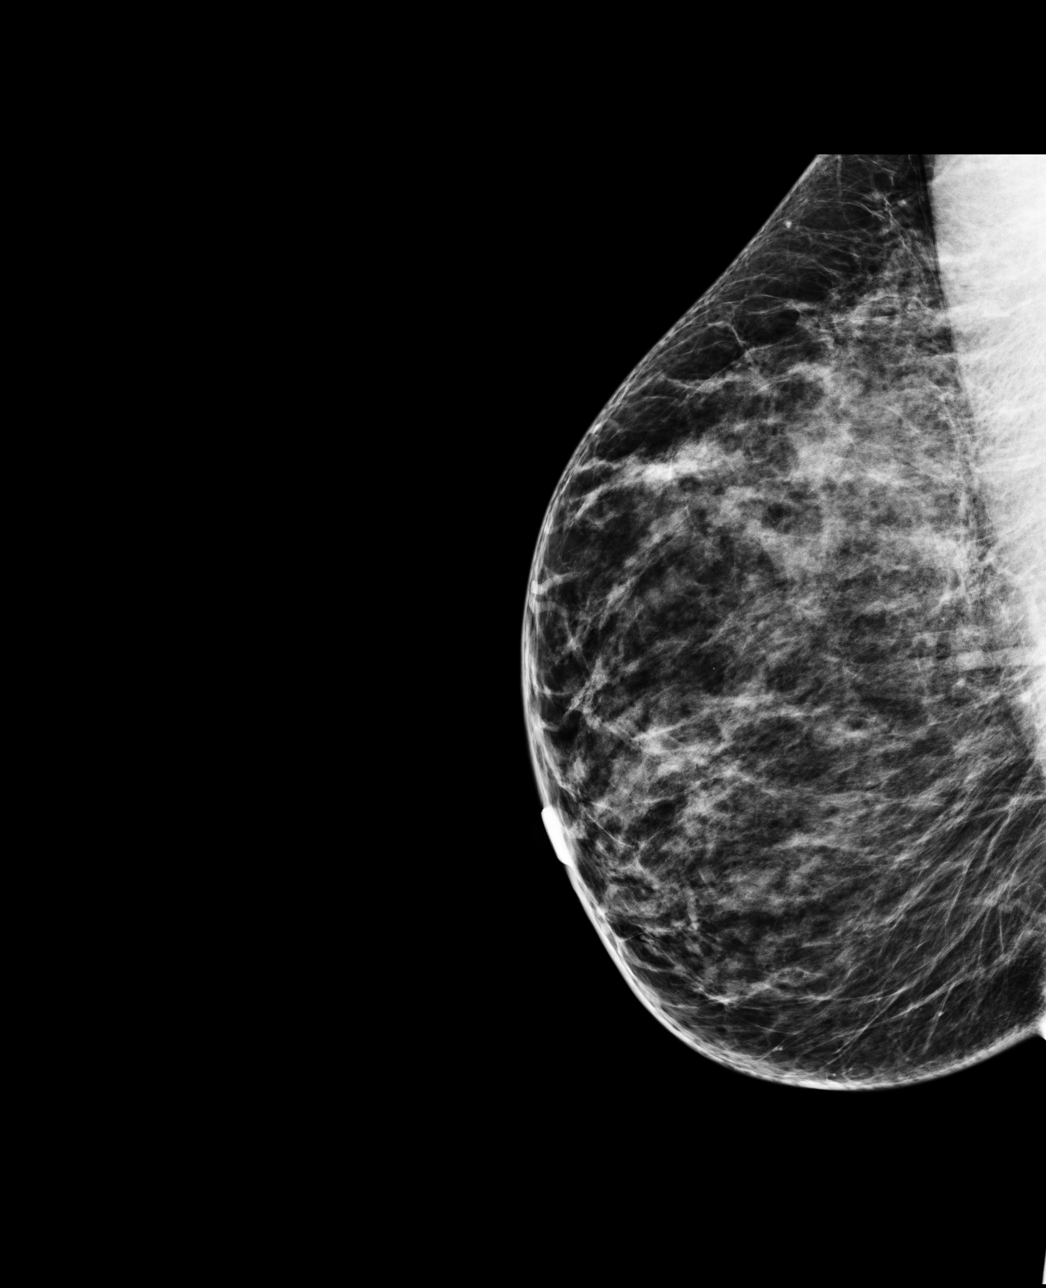

[4 of 4 positions shown; findings below may reference images not displayed]

IMPRESSION: No specific mammographic evidence of malignancy.  Next screening mammogram is recommended in one 
year.

A result letter of this screening mammogram will be mailed directly to the patient.

ASSESSMENT: Negative - BI-RADS 1

Screening mammogram in 1 year.
,

## 2011-09-16 ENCOUNTER — Ambulatory Visit: Payer: BC Managed Care – PPO | Admitting: Family Medicine

## 2011-10-07 ENCOUNTER — Encounter: Payer: Self-pay | Admitting: Family Medicine

## 2011-10-08 ENCOUNTER — Ambulatory Visit (INDEPENDENT_AMBULATORY_CARE_PROVIDER_SITE_OTHER): Payer: BC Managed Care – PPO | Admitting: Family Medicine

## 2011-10-08 ENCOUNTER — Encounter: Payer: Self-pay | Admitting: Family Medicine

## 2011-10-08 VITALS — BP 150/90 | HR 71 | Resp 18 | Ht 64.5 in | Wt 179.0 lb

## 2011-10-08 DIAGNOSIS — I10 Essential (primary) hypertension: Secondary | ICD-10-CM

## 2011-10-08 DIAGNOSIS — J302 Other seasonal allergic rhinitis: Secondary | ICD-10-CM

## 2011-10-08 DIAGNOSIS — E663 Overweight: Secondary | ICD-10-CM

## 2011-10-08 DIAGNOSIS — R7309 Other abnormal glucose: Secondary | ICD-10-CM

## 2011-10-08 DIAGNOSIS — J309 Allergic rhinitis, unspecified: Secondary | ICD-10-CM

## 2011-10-08 DIAGNOSIS — R7302 Impaired glucose tolerance (oral): Secondary | ICD-10-CM | POA: Insufficient documentation

## 2011-10-08 MED ORDER — CETIRIZINE HCL 10 MG PO CHEW: 10.0000 mg | CHEWABLE_TABLET | Freq: Every day | ORAL | Status: DC

## 2011-10-08 MED ORDER — TRIAMTERENE-HCTZ 37.5-25 MG PO TABS: 1.0000 | ORAL_TABLET | Freq: Every day | ORAL | Status: DC

## 2011-10-08 NOTE — Assessment & Plan Note (Signed)
Deteriorated. Patient re-educated about  the importance of commitment to a  minimum of 150 minutes of exercise per week. The importance of healthy food choices with portion control discussed. Encouraged to start a food diary, count calories and to consider  joining a support group. Sample diet sheets offered. Goals set by the patient for the next several months.    

## 2011-10-08 NOTE — Assessment & Plan Note (Signed)
Uncontrolled, out of meds 

## 2011-10-08 NOTE — Patient Instructions (Signed)
CPE in 5 month  Fasting labs this week please, they are overdue.  It is important that you exercise regularly at least 30 minutes 5 times a week. If you develop chest pain, have severe difficulty breathing, or feel very tired, stop exercising immediately and seek medical attention    A healthy diet is rich in fruit, vegetables and whole grains. Poultry fish, nuts and beans are a healthy choice for protein rather then red meat. A low sodium diet and drinking 64 ounces of water daily is generally recommended. Oils and sweet should be limited. Carbohydrates especially for those who are diabetic or overweight, should be limited to 34-45 gram per meal. It is important to eat on a regular schedule, at least 3 times daily. Snacks should be primarily fruits, vegetables or nuts.  Log into "My fitness pal" this helps with calorie control and weight loss.  Weight loss goal of 3 to 4 pounds per month is reasonable  TdaP today

## 2011-10-10 MED ORDER — CETIRIZINE HCL 10 MG PO TBDP: 1.0000 | ORAL_TABLET | Freq: Every day | ORAL | Status: DC

## 2011-10-10 NOTE — Assessment & Plan Note (Signed)
Importance of low carb diet , regular exercise and weight loss to reduce dev of diabetes is discussed

## 2011-10-10 NOTE — Progress Notes (Signed)
  Subjective:    Patient ID: Grace Valdez, female    DOB: 26-Nov-1965, 46 y.o.   MRN: [id number]  HPI The PT is here for follow up and re-evaluation of chronic medical conditions, medication management and review of any available recent lab and radiology data.  Preventive health is updated, specifically  Cancer screening and Immunization.   Questions or concerns regarding consultations or procedures which the PT has had in the interim are  addressed. The PT denies any adverse reactions to current medications since the last visit.  There are no new concerns.  There are no specific complaints       Review of Systems Denies recent fever or chills. Denies sinus pressure, nasal congestion, ear pain or sore throat. Denies chest congestion, productive cough or wheezing. Denies chest pains, palpitations and leg swelling Denies abdominal pain, nausea, vomiting,diarrhea or constipation.   Denies dysuria, frequency, hesitancy or incontinence. Denies joint pain, swelling and limitation in mobility. Denies headaches, seizures, numbness, or tingling. Denies depression, anxiety or insomnia. Denies skin break down or rash.        Objective:   Physical Exam Patient alert and oriented and in no cardiopulmonary distress.  HEENT: No facial asymmetry, EOMI, no sinus tenderness,  oropharynx pink and moist.  Neck supple no adenopathy.  Chest: Clear to auscultation bilaterally.  CVS: S1, S2 no murmurs, no S3.  ABD: Soft non tender. Bowel sounds normal.  Ext: No edema  MS: Adequate ROM spine, shoulders, hips and knees.  Skin: Intact, no ulcerations or rash noted.  Psych: Good eye contact, normal affect. Memory intact not anxious or depressed appearing.  CNS: CN 2-12 intact, power, tone and sensation normal throughout.        Assessment & Plan:

## 2011-10-17 LAB — CBC
HCT: 41.6 % (ref 36.0–46.0)
Hemoglobin: 14 g/dL (ref 12.0–15.0)
MCH: 32 pg (ref 26.0–34.0)
MCHC: 33.7 g/dL (ref 30.0–36.0)
MCV: 95 fL (ref 78.0–100.0)
Platelets: 341 10*3/uL (ref 150–400)
RBC: 4.38 MIL/uL (ref 3.87–5.11)
RDW: 13.2 % (ref 11.5–15.5)
WBC: 6.9 10*3/uL (ref 4.0–10.5)

## 2011-10-17 LAB — TSH: TSH: 1.656 u[IU]/mL (ref 0.350–4.500)

## 2011-10-18 LAB — COMPREHENSIVE METABOLIC PANEL
ALT: 9 U/L (ref 0–35)
AST: 16 U/L (ref 0–37)
Albumin: 4.8 g/dL (ref 3.5–5.2)
Alkaline Phosphatase: 39 U/L (ref 39–117)
BUN: 18 mg/dL (ref 6–23)
CO2: 23 mEq/L (ref 19–32)
Calcium: 9.4 mg/dL (ref 8.4–10.5)
Chloride: 105 mEq/L (ref 96–112)
Creat: 0.92 mg/dL (ref 0.50–1.10)
Glucose, Bld: 89 mg/dL (ref 70–99)
Potassium: 4.4 mEq/L (ref 3.5–5.3)
Sodium: 139 mEq/L (ref 135–145)
Total Bilirubin: 0.6 mg/dL (ref 0.3–1.2)
Total Protein: 7.4 g/dL (ref 6.0–8.3)

## 2011-10-18 LAB — LIPID PANEL
Cholesterol: 201 mg/dL — ABNORMAL HIGH (ref 0–200)
HDL: 67 mg/dL (ref >39)
LDL Cholesterol: 124 mg/dL — ABNORMAL HIGH (ref 0–99)
Total CHOL/HDL Ratio: 3 Ratio
Triglycerides: 52 mg/dL (ref <150)
VLDL: 10 mg/dL (ref 0–40)

## 2011-10-19 LAB — VITAMIN D 25 HYDROXY (VIT D DEFICIENCY, FRACTURES): Vit D, 25-Hydroxy: 10 ng/mL — ABNORMAL LOW (ref 30–89)

## 2011-10-20 ENCOUNTER — Other Ambulatory Visit: Payer: Self-pay | Admitting: Family Medicine

## 2011-10-20 MED ORDER — VITAMIN D3 1.25 MG (50000 UT) PO CAPS: 50000.0000 [IU] | ORAL_CAPSULE | ORAL | Status: DC

## 2011-10-20 NOTE — Progress Notes (Signed)
Called pt left message .

## 2011-10-21 NOTE — Progress Notes (Signed)
Patient aware.

## 2011-10-22 ENCOUNTER — Encounter: Payer: BC Managed Care – PPO | Admitting: Family Medicine

## 2011-10-27 ENCOUNTER — Ambulatory Visit (INDEPENDENT_AMBULATORY_CARE_PROVIDER_SITE_OTHER): Payer: BC Managed Care – PPO | Admitting: Family Medicine

## 2011-10-27 ENCOUNTER — Encounter: Payer: Self-pay | Admitting: Family Medicine

## 2011-10-27 VITALS — BP 122/76 | HR 74 | Temp 98.7°F | Resp 18 | Ht 64.5 in | Wt 174.0 lb

## 2011-10-27 DIAGNOSIS — J209 Acute bronchitis, unspecified: Secondary | ICD-10-CM

## 2011-10-27 DIAGNOSIS — J019 Acute sinusitis, unspecified: Secondary | ICD-10-CM

## 2011-10-27 DIAGNOSIS — I10 Essential (primary) hypertension: Secondary | ICD-10-CM

## 2011-10-27 MED ORDER — FLUCONAZOLE 150 MG PO TABS: ORAL_TABLET | ORAL | Status: AC

## 2011-10-27 MED ORDER — PENICILLIN V POTASSIUM 500 MG PO TABS: 500.0000 mg | ORAL_TABLET | Freq: Three times a day (TID) | ORAL | Status: AC

## 2011-10-27 MED ORDER — PROMETHAZINE-DM 6.25-15 MG/5ML PO SYRP: ORAL_SOLUTION | ORAL | Status: DC

## 2011-10-27 NOTE — Assessment & Plan Note (Signed)
Penicillin prescribed 

## 2011-10-27 NOTE — Patient Instructions (Signed)
CPE as before.  Call if you need me before  You are being treated for acute sinusitis and bronchitis. Medication has been sent to your pharmacy, important to take the eNTIRE course of penicillin.  Drink a lot of water, and get a lot of rest.  Work excuse from 2/12 to return 02 18/2013

## 2011-10-27 NOTE — Assessment & Plan Note (Signed)
Pt to use the decongestants which she already has and will also take a 10 day course of penicillin

## 2011-10-27 NOTE — Assessment & Plan Note (Signed)
Controlled, no change in medication  

## 2011-10-27 NOTE — Progress Notes (Signed)
  Subjective:    Patient ID: Grace Valdez, female    DOB: Aug 09, 1966, 46 y.o.   MRN: [id number]  HPI 4 day h/o progressive head and chest congestion, frontal pressure, yellow green nasal drainage, cough productive of green sputum, pt experiencing body aches, fever and chills. Feels weak and appetite is poor, had to leave work today   Review of Systems See HPI Denies chest pains, palpitations and leg swelling Denies abdominal pain, nausea, vomiting,diarrhea or constipation.   Denies dysuria, frequency, hesitancy or incontinence. Generalized joint pains Frontal headache,denies  seizures, numbness, or tingling. Denies depression, anxiety or insomnia. Denies skin break down or rash.        Objective:   Physical Exam Patient alert and oriented and in no cardiopulmonary distress.Ill appearing  HEENT: No facial asymmetry, EOMI, frontal  sinus tenderness,  oropharynx pink and moist.  Neck supple no adenopathy.  Chest: decreased air entry, no wheezes, few crackles  CVS: S1, S2 no murmurs, no S3.  ABD: Soft non tender. Bowel sounds normal.  Ext: No edema  MS: Adequate ROM spine, shoulders, hips and knees.  .        Assessment & Plan:

## 2012-01-04 ENCOUNTER — Other Ambulatory Visit: Payer: Self-pay | Admitting: Family Medicine

## 2012-01-04 DIAGNOSIS — Z139 Encounter for screening, unspecified: Secondary | ICD-10-CM

## 2012-01-08 ENCOUNTER — Ambulatory Visit (HOSPITAL_COMMUNITY): Payer: BC Managed Care – PPO

## 2012-01-15 ENCOUNTER — Ambulatory Visit (HOSPITAL_COMMUNITY): Payer: BC Managed Care – PPO

## 2012-02-02 ENCOUNTER — Ambulatory Visit (HOSPITAL_COMMUNITY)
Admission: RE | Admit: 2012-02-02 | Discharge: 2012-02-02 | Disposition: A | Discharge status: Home / Self Care | Payer: BC Managed Care – PPO | Source: Ambulatory Visit | Attending: Family Medicine | Admitting: Family Medicine

## 2012-02-02 DIAGNOSIS — Z139 Encounter for screening, unspecified: Secondary | ICD-10-CM

## 2012-02-02 DIAGNOSIS — Z1231 Encounter for screening mammogram for malignant neoplasm of breast: Secondary | ICD-10-CM | POA: Insufficient documentation

## 2012-03-10 ENCOUNTER — Encounter: Payer: Self-pay | Admitting: Family Medicine

## 2012-03-10 ENCOUNTER — Other Ambulatory Visit (HOSPITAL_COMMUNITY)
Admission: RE | Admit: 2012-03-10 | Discharge: 2012-03-10 | Disposition: A | Discharge status: Home / Self Care | Payer: BC Managed Care – PPO | Source: Ambulatory Visit | Attending: Family Medicine | Admitting: Family Medicine

## 2012-03-10 ENCOUNTER — Ambulatory Visit (INDEPENDENT_AMBULATORY_CARE_PROVIDER_SITE_OTHER): Payer: BC Managed Care – PPO | Admitting: Family Medicine

## 2012-03-10 VITALS — BP 126/74 | HR 72 | Resp 18 | Ht 64.5 in | Wt 174.0 lb

## 2012-03-10 DIAGNOSIS — Z01419 Encounter for gynecological examination (general) (routine) without abnormal findings: Secondary | ICD-10-CM | POA: Insufficient documentation

## 2012-03-10 DIAGNOSIS — J309 Allergic rhinitis, unspecified: Secondary | ICD-10-CM

## 2012-03-10 DIAGNOSIS — J302 Other seasonal allergic rhinitis: Secondary | ICD-10-CM

## 2012-03-10 DIAGNOSIS — R7309 Other abnormal glucose: Secondary | ICD-10-CM

## 2012-03-10 DIAGNOSIS — Z23 Encounter for immunization: Secondary | ICD-10-CM

## 2012-03-10 DIAGNOSIS — Z1211 Encounter for screening for malignant neoplasm of colon: Secondary | ICD-10-CM

## 2012-03-10 DIAGNOSIS — E663 Overweight: Secondary | ICD-10-CM

## 2012-03-10 DIAGNOSIS — E559 Vitamin D deficiency, unspecified: Secondary | ICD-10-CM | POA: Insufficient documentation

## 2012-03-10 DIAGNOSIS — N3000 Acute cystitis without hematuria: Secondary | ICD-10-CM

## 2012-03-10 DIAGNOSIS — Z0000 Encounter for general adult medical examination without abnormal findings: Secondary | ICD-10-CM

## 2012-03-10 DIAGNOSIS — I10 Essential (primary) hypertension: Secondary | ICD-10-CM

## 2012-03-10 DIAGNOSIS — R7302 Impaired glucose tolerance (oral): Secondary | ICD-10-CM

## 2012-03-10 LAB — POCT URINALYSIS DIPSTICK
Bilirubin, UA: NEGATIVE
Ketones, UA: NEGATIVE
pH, UA: 7

## 2012-03-10 MED ORDER — LORATADINE 10 MG PO TABS: 10.0000 mg | ORAL_TABLET | Freq: Every day | ORAL | Status: DC

## 2012-03-10 MED ORDER — ERGOCALCIFEROL 1.25 MG (50000 UT) PO CAPS: 50000.0000 [IU] | ORAL_CAPSULE | ORAL | Status: AC

## 2012-03-10 NOTE — Assessment & Plan Note (Signed)
Abnormal urine c/s sent

## 2012-03-10 NOTE — Assessment & Plan Note (Signed)
Improved, rept HBA1C in [place]

## 2012-03-10 NOTE — Patient Instructions (Addendum)
F/u Jan 31 or after.  Fasting CBc, chem 7, lipid, TSH, hBA1C and Vit D January 24 or after.  It is important that you exercise regularly at least 30 minutes 5 times a week. If you develop chest pain, have severe difficulty breathing, or feel very tired, stop exercising immediately and seek medical attention    A healthy diet is rich in fruit, vegetables and whole grains. Poultry fish, nuts and beans are a healthy choice for protein rather then red meat. A low sodium diet and drinking 64 ounces of water daily is generally recommended. Oils and sweet should be limited. Carbohydrates especially for those who are diabetic or overweight, should be limited to 30-45 gram per meal. It is important to eat on a regular schedule, at least 3 times daily. Snacks should be primarily fruits, vegetables or nuts.  Weight loss goal of 2 to 3  pounds per month  Pls take vit D for 6 months, this is sent in for 12 tabs with one refill  Utrine being tested today.  TdAP today and claritin prescription will be given to you, loratidine, this costs $4 without  Insurance at [facility]

## 2012-03-10 NOTE — Assessment & Plan Note (Signed)
Unchanged. Patient re-educated about  the importance of commitment to a  minimum of 150 minutes of exercise per week. The importance of healthy food choices with portion control discussed. Encouraged to start a food diary, count calories and to consider  joining a support group. Sample diet sheets offered. Goals set by the patient for the next several months.    

## 2012-03-10 NOTE — Assessment & Plan Note (Signed)
Controlled, no change in medication  

## 2012-03-10 NOTE — Assessment & Plan Note (Signed)
Vitamin d to be continued for 6 month

## 2012-03-10 NOTE — Progress Notes (Signed)
  Subjective:    Patient ID: Grace Valdez, female    DOB: 05-07-1966, 46 y.o.   MRN: [id number]  HPI The PT is here for annual exam and re-evaluation of chronic medical conditions, medication management and review of any available recent lab and radiology data.  Preventive health is updated, specifically  Cancer screening and Immunization.   Questions or concerns regarding consultations or procedures which the PT has had in the interim are  addressed. The PT denies any adverse reactions to current medications since the last visit.      Review of Systems See HPI Denies recent fever or chills. Denies sinus pressure, nasal congestion, ear pain or sore throat.Increased allergy symptoms requesting medication for this Denies chest congestion, productive cough or wheezing. Denies chest pains, palpitations and leg swelling Denies abdominal pain, nausea, vomiting,diarrhea or constipation.   Denies dysuria, frequency, hesitancy or incontinence. Denies joint pain, swelling and limitation in mobility. Denies headaches, seizures, numbness, or tingling. Denies depression, anxiety or insomnia. Denies skin break down or rash.        Objective:   Physical Exam Pleasant well nourished female, alert and oriented x 3, in no cardio-pulmonary distress. Afebrile. HEENT No facial trauma or asymetry. Sinuses non tender.  EOMI, PERTL.  External ears normal, tympanic membranes clear. Oropharynx moist, no exudate, good dentition. Neck: supple, no adenopathy,JVD or thyromegaly.No bruits.  Chest: Clear to ascultation bilaterally.No crackles or wheezes. Non tender to palpation  Breast: No asymetry,no masses. No nipple discharge or inversion. No axillary or supraclavicular adenopathy  Cardiovascular system; Heart sounds normal,  S1 and  S2 ,no S3.  No murmur, or thrill. Apical beat not displaced Peripheral pulses normal.  Abdomen: Soft, non tender, no organomegaly or masses. No bruits. Bowel  sounds normal. No guarding, tenderness or rebound.  Rectal:  No mass. Guaiac negative stool.  GU: External genitalia normal. No lesions. Vaginal canal normal.No discharge. Uterus normal size, no adnexal masses, no cervical motion or adnexal tenderness.  Musculoskeletal exam: Full ROM of spine, hips , shoulders and knees. No deformity ,swelling or crepitus noted. No muscle wasting or atrophy.   Neurologic: Cranial nerves 2 to 12 intact. Power, tone ,sensation and reflexes normal throughout. No disturbance in gait. No tremor.  Skin: Intact, no ulceration, erythema , scaling or rash noted. Pigmentation normal throughout  Psych; Normal mood and affect. Judgement and concentration normal        Assessment & Plan:

## 2012-03-10 NOTE — Assessment & Plan Note (Signed)
Discussed the importance of regular physical activity and low carb diet to promote weight loss.

## 2012-03-12 LAB — WET PREP BY MOLECULAR PROBE
Candida species: NEGATIVE
Trichomonas vaginosis: NEGATIVE

## 2012-03-13 LAB — URINE CULTURE: Colony Count: 50000

## 2012-03-15 ENCOUNTER — Other Ambulatory Visit: Payer: Self-pay | Admitting: Family Medicine

## 2012-03-15 ENCOUNTER — Other Ambulatory Visit: Payer: Self-pay

## 2012-03-15 MED ORDER — METRONIDAZOLE 500 MG PO TABS: 500.0000 mg | ORAL_TABLET | Freq: Two times a day (BID) | ORAL | Status: DC

## 2012-06-03 ENCOUNTER — Other Ambulatory Visit: Payer: Self-pay | Admitting: Family Medicine

## 2012-10-05 ENCOUNTER — Telehealth: Payer: Self-pay | Admitting: Family Medicine

## 2012-10-05 NOTE — Telephone Encounter (Signed)
Sinus congestion clear mucus, headache, advised sudafed and saline nasal spray and tylenol for the headache. If not better in a couple days or feeling worse, needs appt

## 2012-10-20 ENCOUNTER — Ambulatory Visit: Payer: BC Managed Care – PPO | Admitting: Family Medicine

## 2013-01-18 ENCOUNTER — Ambulatory Visit (INDEPENDENT_AMBULATORY_CARE_PROVIDER_SITE_OTHER): Payer: BC Managed Care – PPO | Admitting: Family Medicine

## 2013-01-18 ENCOUNTER — Encounter: Payer: Self-pay | Admitting: Family Medicine

## 2013-01-18 VITALS — BP 122/80 | HR 77 | Resp 16 | Ht 64.5 in | Wt 176.0 lb

## 2013-01-18 DIAGNOSIS — R7302 Impaired glucose tolerance (oral): Secondary | ICD-10-CM

## 2013-01-18 DIAGNOSIS — I10 Essential (primary) hypertension: Secondary | ICD-10-CM

## 2013-01-18 DIAGNOSIS — Z139 Encounter for screening, unspecified: Secondary | ICD-10-CM

## 2013-01-18 DIAGNOSIS — R5381 Other malaise: Secondary | ICD-10-CM

## 2013-01-18 DIAGNOSIS — J309 Allergic rhinitis, unspecified: Secondary | ICD-10-CM

## 2013-01-18 DIAGNOSIS — E559 Vitamin D deficiency, unspecified: Secondary | ICD-10-CM

## 2013-01-18 DIAGNOSIS — E663 Overweight: Secondary | ICD-10-CM

## 2013-01-18 DIAGNOSIS — J302 Other seasonal allergic rhinitis: Secondary | ICD-10-CM

## 2013-01-18 DIAGNOSIS — R7309 Other abnormal glucose: Secondary | ICD-10-CM

## 2013-01-18 MED ORDER — TRIAMTERENE-HCTZ 37.5-25 MG PO TABS: ORAL_TABLET | ORAL | Status: DC

## 2013-01-18 MED ORDER — LORATADINE 10 MG PO TABS: 10.0000 mg | ORAL_TABLET | Freq: Every day | ORAL | Status: DC | PRN

## 2013-01-18 NOTE — Patient Instructions (Addendum)
F/u in 6 month, please call if you need me before.  THESE LABS are past due, please do in the next 7 days. CBC, lipid, HBA1C, TSH , vit D, chem 7  Blood pressure is excellent   It is important that you exercise regularly at least 30 minutes 5 times a week. If you develop chest pain, have severe difficulty breathing, or feel very tired, stop exercising immediately and seek medical attention    A healthy diet is rich in fruit, vegetables and whole grains. Poultry fish, nuts and beans are a healthy choice for protein rather then red meat. A low sodium diet and drinking 64 ounces of water daily is generally recommended. Oils and sweet should be limited. Carbohydrates especially for those who are diabetic or overweight, should be limited to 34-45 gram per meal. It is important to eat on a regular schedule, at least 3 times daily. Snacks should be primarily fruits, vegetables or nuts.   Weight loss goal of 6 to 10 pounds  New additional med is loratidine for allergies.You will get script  I will discontinue the vit D if lab is normal

## 2013-01-18 NOTE — Progress Notes (Signed)
  Subjective:    Patient ID: Grace Valdez, female    DOB: 1966-01-26, 48 y.o.   MRN: [id number]  HPI The PT is here for follow up and re-evaluation of chronic medical conditions, medication management and review of any available recent lab and radiology data.  Preventive health is updated, specifically  Cancer screening and Immunization.   Questions or concerns regarding consultations or procedures which the PT has had in the interim are  addressed. The PT denies any adverse reactions to current medications since the last visit.  There are no new concerns.  C/o increased and uncontrolled allergies for past 1 mnth, nasal congestion, clear drainage and sneezing     Review of Systems See HPI Denies recent fever or chills. Denies sinus pressure, nasal congestion, ear pain or sore throat. Denies chest congestion, productive cough or wheezing. Denies chest pains, palpitations and leg swelling Denies abdominal pain, nausea, vomiting,diarrhea or constipation.   Denies dysuria, frequency, hesitancy or incontinence. Denies joint pain, swelling and limitation in mobility. Denies headaches, seizures, numbness, or tingling. Denies depression, anxiety or insomnia. Denies skin break down or rash.        Objective:   Physical Exam Patient alert and oriented and in no cardiopulmonary distress.  HEENT: No facial asymmetry, EOMI, no sinus tenderness,  oropharynx pink and moist.  Neck supple no adenopathy.  Chest: Clear to auscultation bilaterally.  CVS: S1, S2 no murmurs, no S3.  ABD: Soft non tender. Bowel sounds normal.  Ext: No edema  MS: Adequate ROM spine, shoulders, hips and knees.  Skin: Intact, no ulcerations or rash noted.  Psych: Good eye contact, normal affect. Memory intact not anxious or depressed appearing.  CNS: CN 2-12 intact, power, tone and sensation normal throughout.        Assessment & Plan:

## 2013-01-26 ENCOUNTER — Other Ambulatory Visit: Payer: Self-pay | Admitting: Family Medicine

## 2013-01-28 LAB — LIPID PANEL
Cholesterol: 192 mg/dL (ref 0–200)
Triglycerides: 72 mg/dL (ref <150)

## 2013-01-28 LAB — BASIC METABOLIC PANEL
CO2: 27 mEq/L (ref 19–32)
Chloride: 106 mEq/L (ref 96–112)
Sodium: 139 mEq/L (ref 135–145)

## 2013-01-28 LAB — TSH: TSH: 1.691 u[IU]/mL (ref 0.350–4.500)

## 2013-01-28 LAB — CBC WITH DIFFERENTIAL/PLATELET
Basophils Absolute: 0 10*3/uL (ref 0.0–0.1)
Basophils Relative: 0 % (ref 0–1)
Hemoglobin: 12.4 g/dL (ref 12.0–15.0)
MCHC: 35.1 g/dL (ref 30.0–36.0)
Neutro Abs: 3.7 10*3/uL (ref 1.7–7.7)
Neutrophils Relative %: 57 % (ref 43–77)
RDW: 13.4 % (ref 11.5–15.5)

## 2013-01-29 NOTE — Assessment & Plan Note (Signed)
Update lab needed to determine if pt needs to continue supplement

## 2013-01-29 NOTE — Assessment & Plan Note (Signed)
Patient educated about the importance of limiting  Carbohydrate intake , the need to commit to daily physical activity for a minimum of 30 minutes , and to commit weight loss. The fact that changes in all these areas will reduce or eliminate all together the development of diabetes is stressed.   Updated lab to be reviewed

## 2013-01-29 NOTE — Assessment & Plan Note (Signed)
Unchanged. Patient re-educated about  the importance of commitment to a  minimum of 150 minutes of exercise per week. The importance of healthy food choices with portion control discussed. Encouraged to start a food diary, count calories and to consider  joining a support group. Sample diet sheets offered. Goals set by the patient for the next several months.    

## 2013-01-29 NOTE — Assessment & Plan Note (Signed)
Controlled, no change in medication DASH diet and commitment to daily physical activity for a minimum of 30 minutes discussed and encouraged, as a part of hypertension management. The importance of attaining a healthy weight is also discussed.  

## 2013-01-29 NOTE — Assessment & Plan Note (Signed)
increased and uncontrolled symptoms pt to start meds

## 2013-01-30 ENCOUNTER — Other Ambulatory Visit: Payer: Self-pay | Admitting: Family Medicine

## 2013-01-30 DIAGNOSIS — Z139 Encounter for screening, unspecified: Secondary | ICD-10-CM

## 2013-02-13 ENCOUNTER — Ambulatory Visit (HOSPITAL_COMMUNITY)
Admission: RE | Admit: 2013-02-13 | Discharge: 2013-02-13 | Disposition: A | Discharge status: Home / Self Care | Payer: BC Managed Care – PPO | Source: Ambulatory Visit | Attending: Family Medicine | Admitting: Family Medicine

## 2013-02-13 DIAGNOSIS — Z1231 Encounter for screening mammogram for malignant neoplasm of breast: Secondary | ICD-10-CM | POA: Insufficient documentation

## 2013-02-13 DIAGNOSIS — Z139 Encounter for screening, unspecified: Secondary | ICD-10-CM

## 2013-07-24 ENCOUNTER — Other Ambulatory Visit: Payer: Self-pay | Admitting: Family Medicine

## 2013-07-27 ENCOUNTER — Encounter (INDEPENDENT_AMBULATORY_CARE_PROVIDER_SITE_OTHER): Payer: Self-pay

## 2013-07-27 ENCOUNTER — Ambulatory Visit (INDEPENDENT_AMBULATORY_CARE_PROVIDER_SITE_OTHER): Payer: BC Managed Care – PPO | Admitting: Family Medicine

## 2013-07-27 ENCOUNTER — Encounter: Payer: Self-pay | Admitting: Family Medicine

## 2013-07-27 VITALS — BP 122/80 | HR 70 | Resp 18 | Ht 64.5 in | Wt 176.1 lb

## 2013-07-27 DIAGNOSIS — E663 Overweight: Secondary | ICD-10-CM

## 2013-07-27 DIAGNOSIS — R7309 Other abnormal glucose: Secondary | ICD-10-CM

## 2013-07-27 DIAGNOSIS — E559 Vitamin D deficiency, unspecified: Secondary | ICD-10-CM

## 2013-07-27 DIAGNOSIS — J302 Other seasonal allergic rhinitis: Secondary | ICD-10-CM

## 2013-07-27 DIAGNOSIS — R5381 Other malaise: Secondary | ICD-10-CM

## 2013-07-27 DIAGNOSIS — J309 Allergic rhinitis, unspecified: Secondary | ICD-10-CM

## 2013-07-27 DIAGNOSIS — I10 Essential (primary) hypertension: Secondary | ICD-10-CM

## 2013-07-27 DIAGNOSIS — R7302 Impaired glucose tolerance (oral): Secondary | ICD-10-CM

## 2013-07-27 NOTE — Patient Instructions (Signed)
CPE early June   No med change at this time  Reconsider  Flu vaccine   Fasting lipid, cbc, lipid, HBA1C, tsh , vit D in 5 month, and 3 weeks  Pls cut back on fatty and fried foods , and  snacks

## 2013-07-27 NOTE — Progress Notes (Signed)
  Subjective:    Patient ID: Grace Valdez, female    DOB: 1966/08/15, 47 y.o.   MRN: [id number]  HPI The PT is here for follow up and re-evaluation of chronic medical conditions, medication management and review of any available recent lab and radiology data.  Preventive health is updated, specifically  Cancer screening and Immunization.  Refuses flu vaccine  The PT denies any adverse reactions to current medications since the last visit.  There are no new concerns.  There are no specific complaints       Review of Systems See HPI Denies recent fever or chills. Denies sinus pressure, nasal congestion, ear pain or sore throat. Denies chest congestion, productive cough or wheezing. Denies chest pains, palpitations and leg swelling Denies abdominal pain, nausea, vomiting,diarrhea or constipation.   Denies dysuria, frequency, hesitancy or incontinence. Denies joint pain, swelling and limitation in mobility. Denies headaches, seizures, numbness, or tingling. Denies depression, anxiety or insomnia. Denies skin break down or rash.        Objective:   Physical Exam  Patient alert and oriented and in no cardiopulmonary distress.  HEENT: No facial asymmetry, EOMI, no sinus tenderness,  oropharynx pink and moist.  Neck supple no adenopathy.  Chest: Clear to auscultation bilaterally.  CVS: S1, S2 no murmurs, no S3.  ABD: Soft non tender. Bowel sounds normal.  Ext: No edema  MS: Adequate ROM spine, shoulders, hips and knees.  Skin: Intact, no ulcerations or rash noted.  Psych: Good eye contact, normal affect. Memory intact not anxious or depressed appearing.  CNS: CN 2-12 intact, power, tone and sensation normal throughout.       Assessment & Plan:

## 2013-07-29 NOTE — Assessment & Plan Note (Signed)
Controlled, no change in medication DASH diet and commitment to daily physical activity for a minimum of 30 minutes discussed and encouraged, as a part of hypertension management. The importance of attaining a healthy weight is also discussed.  

## 2013-07-29 NOTE — Assessment & Plan Note (Signed)
Deteriorated. Patient re-educated about  the importance of commitment to a  minimum of 150 minutes of exercise per week. The importance of healthy food choices with portion control discussed. Encouraged to start a food diary, count calories and to consider  joining a support group. Sample diet sheets offered. Goals set by the patient for the next several months.    

## 2013-07-29 NOTE — Assessment & Plan Note (Signed)
Patient educated about the importance of limiting  Carbohydrate intake , the need to commit to daily physical activity for a minimum of 30 minutes , and to commit weight loss. The fact that changes in all these areas will reduce or eliminate all together the development of diabetes is stressed.    

## 2013-07-29 NOTE — Assessment & Plan Note (Signed)
Controlled, no change in medication  

## 2013-07-29 NOTE — Assessment & Plan Note (Signed)
Updated lab next visit

## 2013-12-02 LAB — LIPID PANEL
Cholesterol: 172 mg/dL (ref 0–200)
HDL: 64 mg/dL (ref >39)
LDL Cholesterol: 99 mg/dL (ref 0–99)
Total CHOL/HDL Ratio: 2.7 Ratio
Triglycerides: 45 mg/dL (ref <150)
VLDL: 9 mg/dL (ref 0–40)

## 2013-12-02 LAB — HEMOGLOBIN A1C
Hgb A1c MFr Bld: 5.9 % — ABNORMAL HIGH
Mean Plasma Glucose: 123 mg/dL — ABNORMAL HIGH

## 2013-12-02 LAB — CBC
HCT: 36.8 % (ref 36.0–46.0)
Hemoglobin: 12.6 g/dL (ref 12.0–15.0)
MCH: 30.9 pg (ref 26.0–34.0)
MCHC: 34.2 g/dL (ref 30.0–36.0)
MCV: 90.2 fL (ref 78.0–100.0)
Platelets: 367 10*3/uL (ref 150–400)
RBC: 4.08 MIL/uL (ref 3.87–5.11)
RDW: 14 % (ref 11.5–15.5)
WBC: 6.9 10*3/uL (ref 4.0–10.5)

## 2013-12-02 LAB — VITAMIN D 25 HYDROXY (VIT D DEFICIENCY, FRACTURES): Vit D, 25-Hydroxy: 32 ng/mL (ref 30–89)

## 2013-12-02 LAB — TSH: TSH: 1.712 u[IU]/mL (ref 0.350–4.500)

## 2014-01-09 ENCOUNTER — Other Ambulatory Visit: Payer: Self-pay | Admitting: Family Medicine

## 2014-01-09 DIAGNOSIS — Z1231 Encounter for screening mammogram for malignant neoplasm of breast: Secondary | ICD-10-CM

## 2014-02-14 ENCOUNTER — Encounter: Payer: Self-pay | Admitting: Family Medicine

## 2014-02-14 ENCOUNTER — Encounter (INDEPENDENT_AMBULATORY_CARE_PROVIDER_SITE_OTHER): Payer: Self-pay

## 2014-02-14 ENCOUNTER — Ambulatory Visit (INDEPENDENT_AMBULATORY_CARE_PROVIDER_SITE_OTHER): Payer: BC Managed Care – PPO | Admitting: Family Medicine

## 2014-02-14 VITALS — BP 160/80 | HR 82 | Resp 18 | Wt 179.0 lb

## 2014-02-14 DIAGNOSIS — Z124 Encounter for screening for malignant neoplasm of cervix: Secondary | ICD-10-CM

## 2014-02-14 DIAGNOSIS — I10 Essential (primary) hypertension: Secondary | ICD-10-CM

## 2014-02-14 DIAGNOSIS — R7309 Other abnormal glucose: Secondary | ICD-10-CM

## 2014-02-14 DIAGNOSIS — Z0000 Encounter for general adult medical examination without abnormal findings: Secondary | ICD-10-CM

## 2014-02-14 DIAGNOSIS — Z1211 Encounter for screening for malignant neoplasm of colon: Secondary | ICD-10-CM

## 2014-02-14 DIAGNOSIS — R7302 Impaired glucose tolerance (oral): Secondary | ICD-10-CM

## 2014-02-14 NOTE — Patient Instructions (Addendum)
F/u end October/early November, call if you need me before  Please remember to take your BP meds every day  Please cut back on pasta and white foods, increase vegetables, reduce sweets, and sweet drinks , your blood sugar has increased and your weight is up  You need to make these changes so that you do not become diabetic  Fasting chem 7 and hBa1C 3 to 5 days before next visit  PLEASE remember to take BP meds as schedulesd evert day for BP control

## 2014-02-15 ENCOUNTER — Other Ambulatory Visit (HOSPITAL_COMMUNITY)
Admission: RE | Admit: 2014-02-15 | Discharge: 2014-02-15 | Disposition: A | Discharge status: Home / Self Care | Payer: BC Managed Care – PPO | Source: Ambulatory Visit | Attending: Family Medicine | Admitting: Family Medicine

## 2014-02-15 DIAGNOSIS — Z01419 Encounter for gynecological examination (general) (routine) without abnormal findings: Secondary | ICD-10-CM | POA: Insufficient documentation

## 2014-02-15 DIAGNOSIS — Z1151 Encounter for screening for human papillomavirus (HPV): Secondary | ICD-10-CM | POA: Insufficient documentation

## 2014-02-15 LAB — POC HEMOCCULT BLD/STL (OFFICE/1-CARD/DIAGNOSTIC): Fecal Occult Blood, POC: NEGATIVE

## 2014-02-16 ENCOUNTER — Ambulatory Visit (HOSPITAL_COMMUNITY)
Admission: RE | Admit: 2014-02-16 | Discharge: 2014-02-16 | Disposition: A | Discharge status: Home / Self Care | Payer: BC Managed Care – PPO | Source: Ambulatory Visit | Attending: Family Medicine | Admitting: Family Medicine

## 2014-02-16 DIAGNOSIS — Z1231 Encounter for screening mammogram for malignant neoplasm of breast: Secondary | ICD-10-CM

## 2014-02-18 ENCOUNTER — Encounter: Payer: Self-pay | Admitting: Family Medicine

## 2014-02-18 NOTE — Assessment & Plan Note (Addendum)
Annual exam as documented. Counseling done  re healthy lifestyle involving commitment to 150 minutes exercise per week, heart healthy diet, and attaining healthy weight.The importance of adequate sleep also discussed. Regular seat belt use , is also discussed. Changes in health habits are decided on by the patient with goals and time frames  set for achieving them. Immunization and cancer screening needs are specifically addressed at this visit.

## 2014-02-18 NOTE — Progress Notes (Signed)
   Subjective:    Patient ID: Grace Valdez, female    DOB: 20-Mar-1966, 48 y.o.   MRN: [id number]  HPI Pt in for annual physical exam. No concerns today except that she is aware that she forgot to take her BP med today, which does not often happen, no associated symptoms with the elevated BP   Review of Systems See HPI     Objective:   Physical Exam  Pleasant well nourished female, alert and oriented x 3, in no cardio-pulmonary distress. Afebrile. HEENT No facial trauma or asymetry. Sinuses non tender.  EOMI, PERTL, fundoscopic exam is normal, no hemorhage or exudate.  External ears normal, tympanic membranes clear. Oropharynx moist, no exudate, good dentition. Neck: supple, no adenopathy,JVD or thyromegaly.No bruits.  Chest: Clear to ascultation bilaterally.No crackles or wheezes. Non tender to palpation  Breast: No asymetry,no masses. No nipple discharge or inversion. No axillary or supraclavicular adenopathy  Cardiovascular system; Heart sounds normal,  S1 and  S2 ,no S3.  No murmur, or thrill. Apical beat not displaced Peripheral pulses normal.  Abdomen: Soft, non tender, no organomegaly or masses. No bruits. Bowel sounds normal. No guarding, tenderness or rebound.  Rectal:  No mass. Guaiac negative stool.  GU: External genitalia normal. No lesions. Vaginal canal normal.No discharge. Uterus slightly enlarged, no adnexal masses, no cervical motion or adnexal tenderness.  Musculoskeletal exam: Full ROM of spine, hips , shoulders and knees. No deformity ,swelling or crepitus noted. No muscle wasting or atrophy.   Neurologic: Cranial nerves 2 to 12 intact. Power, tone ,sensation and reflexes normal throughout. No disturbance in gait. No tremor.  Skin: Intact, no ulceration, erythema , scaling or rash noted. Pigmentation normal throughout  Psych; Normal mood and affect. Judgement and concentration normal       Assessment & Plan:  Routine general  medical examination at a health care facility Annual exam as documented. Counseling done  re healthy lifestyle involving commitment to 150 minutes exercise per week, heart healthy diet, and attaining healthy weight.The importance of adequate sleep also discussed. Regular seat belt use , is also discussed. Changes in health habits are decided on by the patient with goals and time frames  set for achieving them. Immunization and cancer screening needs are specifically addressed at this visit.

## 2014-02-19 LAB — CYTOLOGY - PAP

## 2014-02-26 ENCOUNTER — Other Ambulatory Visit: Payer: Self-pay | Admitting: Family Medicine

## 2014-02-27 NOTE — Telephone Encounter (Signed)
Pt called wanting her BP refilled triamzerene refilled. Please advise

## 2014-07-14 LAB — BASIC METABOLIC PANEL
BUN: 16 mg/dL (ref 6–23)
CALCIUM: 9 mg/dL (ref 8.4–10.5)
CHLORIDE: 100 meq/L (ref 96–112)
CO2: 26 mEq/L (ref 19–32)
CREATININE: 0.96 mg/dL (ref 0.50–1.10)
Glucose, Bld: 96 mg/dL (ref 70–99)
Potassium: 4.3 mEq/L (ref 3.5–5.3)
SODIUM: 139 meq/L (ref 135–145)

## 2014-07-14 LAB — HEMOGLOBIN A1C
Hgb A1c MFr Bld: 5.5 % (ref <5.7)
MEAN PLASMA GLUCOSE: 111 mg/dL (ref <117)

## 2014-07-19 ENCOUNTER — Encounter (INDEPENDENT_AMBULATORY_CARE_PROVIDER_SITE_OTHER): Payer: Self-pay

## 2014-07-19 ENCOUNTER — Encounter: Payer: Self-pay | Admitting: Family Medicine

## 2014-07-19 ENCOUNTER — Ambulatory Visit (INDEPENDENT_AMBULATORY_CARE_PROVIDER_SITE_OTHER): Payer: BC Managed Care – PPO | Admitting: Family Medicine

## 2014-07-19 VITALS — BP 122/80 | HR 72 | Resp 18 | Ht 65.0 in | Wt 178.0 lb

## 2014-07-19 DIAGNOSIS — R7302 Impaired glucose tolerance (oral): Secondary | ICD-10-CM

## 2014-07-19 DIAGNOSIS — R058 Other specified cough: Secondary | ICD-10-CM

## 2014-07-19 DIAGNOSIS — R05 Cough: Secondary | ICD-10-CM

## 2014-07-19 DIAGNOSIS — I10 Essential (primary) hypertension: Secondary | ICD-10-CM

## 2014-07-19 DIAGNOSIS — J302 Other seasonal allergic rhinitis: Secondary | ICD-10-CM

## 2014-07-19 DIAGNOSIS — E663 Overweight: Secondary | ICD-10-CM

## 2014-07-19 MED ORDER — LORATADINE 10 MG PO TABS: 10.0000 mg | ORAL_TABLET | Freq: Every day | ORAL | Status: DC | PRN

## 2014-07-19 MED ORDER — PREDNISONE 5 MG PO TABS: 5.0000 mg | ORAL_TABLET | Freq: Two times a day (BID) | ORAL | Status: DC

## 2014-07-19 MED ORDER — PROMETHAZINE-DM 6.25-15 MG/5ML PO SYRP: ORAL_SOLUTION | ORAL | Status: AC

## 2014-07-19 MED ORDER — TRIAMTERENE-HCTZ 37.5-25 MG PO TABS: ORAL_TABLET | ORAL | Status: DC

## 2014-07-19 MED ORDER — PROMETHAZINE-DM 6.25-15 MG/5ML PO SYRP: ORAL_SOLUTION | ORAL | Status: DC

## 2014-07-19 NOTE — Assessment & Plan Note (Signed)
Uncontrolled with flare, short course of steroids, daily loratidine and nasal flushes, short course of dailu sudafed as needed

## 2014-07-19 NOTE — Assessment & Plan Note (Signed)
Improved. Pt applauded on succesful weight loss through lifestyle change, and encouraged to continue same. Weight loss goal set for the next several months.  

## 2014-07-19 NOTE — Assessment & Plan Note (Signed)
Controlled, no change in medication DASH diet and commitment to daily physical activity for a minimum of 30 minutes discussed and encouraged, as a part of hypertension management. The importance of attaining a healthy weight is also discussed.  

## 2014-07-19 NOTE — Assessment & Plan Note (Addendum)
uncontrolled x 3 weeks, pt to start daily allergy medication and cough suppressant also prescribed

## 2014-07-19 NOTE — Patient Instructions (Addendum)
F/u in early April, call if you need me before  Blood pressure is excellent, conmtinue medicaiton as before  Blood sugar has normalized, which is excellent  It is important that you exercise regularly at least 30 minutes 5 times a week. If you develop chest pain, have severe difficulty breathing, or feel very tired, stop exercising immediately and seek medical attention   You have uncontrolled allergy symptoms,please commit to daily loratidine, saline nasal flushes tiill  Less congested , and OK to use OTC sudafed one daily , as needed, for excess drainage , take for next 3 days Cough suppressant is prescribed  Prednisone is prescribed for 5 days  Please come in the next 2 weeks for flu vaccine  Meds are sent to [facility] per your request  CBC, fasting lipid, chem 7 and TSh in  Early April

## 2014-07-19 NOTE — Assessment & Plan Note (Signed)
Improved and currently corrected Pt applauded on this Patient educated about the importance of limiting  Carbohydrate intake , the need to commit to daily physical activity for a minimum of 30 minutes , and to commit weight loss. The fact that changes in all these areas will reduce or eliminate all together the development of diabetes is stressed.

## 2014-07-19 NOTE — Progress Notes (Signed)
   Subjective:    Patient ID: Grace Valdez, female    DOB: 1965/12/14, 48 y.o.   MRN: [id number]  HPI The PT is here for follow up and re-evaluation of chronic medical conditions, medication management and review of any available recent lab and radiology data.  Preventive health is updated, specifically  Cancer screening and Immunization. Holding off on flu vaccine today , but may call  Back educated re the benefit of  Taking the vaccine  The PT denies any adverse reactions to current medications since the last visit. She has increased exercise and reduced sugar intake , with normalization of her blood sugar average 3 week h/o increased nasal and sinus congestion, excessive clear drainage , excessive cough, mainly clear sputum and cough is present when lying dow, has post nasal drainage, no recent or current fever or chills. Has not been using allergy medication prescribed       Review of Systems See HPI Denies recent fever or chills. . Denies chest pains, palpitations and leg swelling Denies abdominal pain, nausea, vomiting,diarrhea or constipation.   Denies dysuria, frequency, hesitancy or incontinence. Denies joint pain, swelling and limitation in mobility. Denies headaches, seizures, numbness, or tingling. Denies depression or  Anxiety, she does have some  Insomnia due to excess cough Denies skin break down or rash.        Objective:   Physical Exam BP 122/80 mmHg  Pulse 72  Resp 18  Ht 5\' 5"  (1.651 m)  Wt 178 lb (80.74 kg)  BMI 29.62 kg/m2  SpO2 97% Patient alert and oriented and in no cardiopulmonary distress.  HEENT: No facial asymmetry, EOMI,   oropharynx pink and moist.  Neck supple no JVD, no mass.mild frontal sinus tenderness, erythema and edema of nasal mucosa, no cervical adenitis, TM clear  bilaterally  Chest: Clear to auscultation bilaterally.  CVS: S1, S2 no murmurs, no S3.Regular rate.  ABD: Soft non tender.   Ext: No edema  MS: Adequate ROM  spine, shoulders, hips and knees.  Skin: Intact, no ulcerations or rash noted.  Psych: Good eye contact, normal affect. Memory intact not anxious or depressed appearing.  CNS: CN 2-12 intact, power,  normal throughout.no focal deficits noted.        Assessment & Plan:  Essential hypertension Controlled, no change in medication DASH diet and commitment to daily physical activity for a minimum of 30 minutes discussed and encouraged, as a part of hypertension management. The importance of attaining a healthy weight is also discussed.   Seasonal allergies Uncontrolled with flare, short course of steroids, daily loratidine and nasal flushes, short course of dailu sudafed as needed  IGT (impaired glucose tolerance) Improved and currently corrected Pt applauded on this Patient educated about the importance of limiting  Carbohydrate intake , the need to commit to daily physical activity for a minimum of 30 minutes , and to commit weight loss. The fact that changes in all these areas will reduce or eliminate all together the development of diabetes is stressed.     Allergic cough uncontrolled x 3 weeks, pt to start daily allergy medication and cough suppressant also prescribed  Overweight (BMI 25.0-29.9) Improved. Pt applauded on succesful weight loss through lifestyle change, and encouraged to continue same. Weight loss goal set for the next several months.

## 2014-12-22 LAB — CBC
HEMATOCRIT: 37.6 % (ref 36.0–46.0)
Hemoglobin: 12.5 g/dL (ref 12.0–15.0)
MCH: 31.2 pg (ref 26.0–34.0)
MCHC: 33.2 g/dL (ref 30.0–36.0)
MCV: 93.8 fL (ref 78.0–100.0)
Platelets: 370 10*3/uL (ref 150–400)
RBC: 4.01 MIL/uL (ref 3.87–5.11)
RDW: 13.8 % (ref 11.5–15.5)
WBC: 6.4 10*3/uL (ref 4.0–10.5)

## 2014-12-22 LAB — BASIC METABOLIC PANEL
BUN: 18 mg/dL (ref 6–23)
CHLORIDE: 104 meq/L (ref 96–112)
CO2: 26 mEq/L (ref 19–32)
CREATININE: 0.92 mg/dL (ref 0.50–1.10)
Calcium: 9.1 mg/dL (ref 8.4–10.5)
Glucose, Bld: 92 mg/dL (ref 70–99)
POTASSIUM: 4 meq/L (ref 3.5–5.3)
Sodium: 140 mEq/L (ref 135–145)

## 2014-12-22 LAB — LIPID PANEL
CHOLESTEROL: 197 mg/dL (ref 0–200)
HDL: 68 mg/dL (ref >=46)
LDL CALC: 119 mg/dL — AB (ref 0–99)
Total CHOL/HDL Ratio: 2.9 Ratio
Triglycerides: 48 mg/dL (ref <150)
VLDL: 10 mg/dL (ref 0–40)

## 2014-12-22 LAB — TSH: TSH: 1.593 u[IU]/mL (ref 0.350–4.500)

## 2014-12-27 ENCOUNTER — Encounter: Payer: Self-pay | Admitting: Family Medicine

## 2014-12-27 ENCOUNTER — Ambulatory Visit (INDEPENDENT_AMBULATORY_CARE_PROVIDER_SITE_OTHER): Payer: BLUE CROSS/BLUE SHIELD | Admitting: Family Medicine

## 2014-12-27 VITALS — BP 124/76 | HR 66 | Resp 18 | Ht 63.0 in | Wt 178.0 lb

## 2014-12-27 DIAGNOSIS — Z1159 Encounter for screening for other viral diseases: Secondary | ICD-10-CM

## 2014-12-27 DIAGNOSIS — I10 Essential (primary) hypertension: Secondary | ICD-10-CM | POA: Diagnosis not present

## 2014-12-27 DIAGNOSIS — E785 Hyperlipidemia, unspecified: Secondary | ICD-10-CM | POA: Diagnosis not present

## 2014-12-27 DIAGNOSIS — E669 Obesity, unspecified: Secondary | ICD-10-CM

## 2014-12-27 DIAGNOSIS — J302 Other seasonal allergic rhinitis: Secondary | ICD-10-CM

## 2014-12-27 NOTE — Assessment & Plan Note (Signed)
unchnaged Patient re-educated about  the importance of commitment to a  minimum of 150 minutes of exercise per week.  The importance of healthy food choices with portion control discussed. Encouraged to start a food diary, count calories and to consider  joining a support group. Sample diet sheets offered. Goals set by the patient for the next several months.   Weight /BMI 12/27/2014 07/19/2014 02/14/2014  WEIGHT 178 lb 178 lb 179 lb  HEIGHT 5\' 3"  5\' 5"  -  BMI 31.54 kg/m2 29.62 kg/m2 30.26 kg/m2    Current exercise per week 150 minutes.

## 2014-12-27 NOTE — Progress Notes (Signed)
Subjective:    Patient ID: Grace Valdez, female    DOB: 02-May-1966, 49 y.o.   MRN: [id number]  HPI The PT is here for follow up and re-evaluation of chronic medical conditions, medication management and review of any available recent lab and radiology data.  Preventive health is updated, specifically  Cancer screening and Immunization.   . The PT denies any adverse reactions to current medications since the last visit.  There are no new concerns. Committed to 5 days per week exercise , aims for 7, needs to change snack choice to fruit and vegetales There are no specific complaints       Review of Systems See HPI Denies recent fever or chills. Denies sinus pressure, nasal congestion, ear pain or sore throat. Denies chest congestion, productive cough or wheezing. Denies chest pains, palpitations and leg swelling Denies abdominal pain, nausea, vomiting,diarrhea or constipation.   Denies dysuria, frequency, hesitancy or incontinence. Denies joint pain, swelling and limitation in mobility. Denies headaches, seizures, numbness, or tingling. Denies depression, anxiety or insomnia. Denies skin break down or rash.        Objective:   Physical Exam  BP 124/76 mmHg  Pulse 66  Resp 18  Ht 5\' 3"  (1.6 m)  Wt 178 lb (80.74 kg)  BMI 31.54 kg/m2  SpO2 100% Patient alert and oriented and in no cardiopulmonary distress.  HEENT: No facial asymmetry, EOMI,   oropharynx pink and moist.  Neck supple no JVD, no mass.  Chest: Clear to auscultation bilaterally.  CVS: S1, S2 no murmurs, no S3.Regular rate.  ABD: Soft non tender.   Ext: No edema  MS: Adequate ROM spine, shoulders, hips and knees.  Skin: Intact, no ulcerations or rash noted.  Psych: Good eye contact, normal affect. Memory intact not anxious or depressed appearing.  CNS: CN 2-12 intact, power,  normal throughout.no focal deficits noted.       Assessment & Plan:  Essential hypertension Controlled, no change  in medication DASH diet and commitment to daily physical activity for a minimum of 30 minutes discussed and encouraged, as a part of hypertension management. The importance of attaining a healthy weight is also discussed.  BP/Weight 12/27/2014 07/19/2014 02/14/2014 07/27/2013 01/18/2013 03/10/2012 0/04/6760  Systolic BP 950 932 671 245 809 983 382  Diastolic BP 76 80 80 80 80 74 76  Wt. (Lbs) 178 178 179 176.12 176 174 174.04  BMI 31.54 29.62 30.26 29.78 29.75 29.42 29.42         Obesity (BMI 30.0-34.9) unchnaged Patient re-educated about  the importance of commitment to a  minimum of 150 minutes of exercise per week.  The importance of healthy food choices with portion control discussed. Encouraged to start a food diary, count calories and to consider  joining a support group. Sample diet sheets offered. Goals set by the patient for the next several months.   Weight /BMI 12/27/2014 07/19/2014 02/14/2014  WEIGHT 178 lb 178 lb 179 lb  HEIGHT 5\' 3"  5\' 5"  -  BMI 31.54 kg/m2 29.62 kg/m2 30.26 kg/m2    Current exercise per week 150 minutes.    Dyslipidemia, goal LDL below 100 Hyperlipidemia:Low fat diet discussed and encouraged.   Lipid Panel  Lab Results  Component Value Date   CHOL 197 12/21/2014   HDL 68 12/21/2014   LDLCALC 119* 12/21/2014   TRIG 48 12/21/2014   CHOLHDL 2.9 12/21/2014      Updated lab needed at/ before next visit.    Seasonal  allergies No flares in past 6 months, and on no regular medication currently

## 2014-12-27 NOTE — Assessment & Plan Note (Signed)
Hyperlipidemia:Low fat diet discussed and encouraged.   Lipid Panel  Lab Results  Component Value Date   CHOL 197 12/21/2014   HDL 68 12/21/2014   LDLCALC 119* 12/21/2014   TRIG 48 12/21/2014   CHOLHDL 2.9 12/21/2014      Updated lab needed at/ before next visit.

## 2014-12-27 NOTE — Assessment & Plan Note (Signed)
Controlled, no change in medication DASH diet and commitment to daily physical activity for a minimum of 30 minutes discussed and encouraged, as a part of hypertension management. The importance of attaining a healthy weight is also discussed.  BP/Weight 12/27/2014 07/19/2014 02/14/2014 07/27/2013 01/18/2013 03/10/2012 5/32/0233  Systolic BP 435 686 168 372 902 111 552  Diastolic BP 76 80 80 80 80 74 76  Wt. (Lbs) 178 178 179 176.12 176 174 174.04  BMI 31.54 29.62 30.26 29.78 29.75 29.42 29.42

## 2014-12-27 NOTE — Patient Instructions (Addendum)
F/u with rectal in .6 month, call if you ned me before  Commit to changing food choice esp the snacks and changing portions, mainly vegetables and fruit , fresh or frozen   Bad  Cholesterol higher than it should be , so lets stop the snacks in packs!  Fasting, chem 7 , LDL and hIV test in 6 months  Weight loss goal of 6 pounds in 6 month

## 2014-12-27 NOTE — Assessment & Plan Note (Signed)
No flares in past 6 months, and on no regular medication currently

## 2015-01-16 ENCOUNTER — Other Ambulatory Visit: Payer: Self-pay | Admitting: Family Medicine

## 2015-01-16 DIAGNOSIS — Z1231 Encounter for screening mammogram for malignant neoplasm of breast: Secondary | ICD-10-CM

## 2015-03-11 ENCOUNTER — Ambulatory Visit (HOSPITAL_COMMUNITY): Payer: BLUE CROSS/BLUE SHIELD

## 2015-05-29 ENCOUNTER — Encounter: Payer: Self-pay | Admitting: *Deleted

## 2015-05-29 ENCOUNTER — Ambulatory Visit: Payer: BLUE CROSS/BLUE SHIELD | Admitting: Family Medicine

## 2015-06-20 ENCOUNTER — Other Ambulatory Visit: Payer: Self-pay

## 2015-06-20 MED ORDER — TRIAMTERENE-HCTZ 37.5-25 MG PO TABS: ORAL_TABLET | ORAL | Status: DC

## 2015-07-22 ENCOUNTER — Ambulatory Visit (INDEPENDENT_AMBULATORY_CARE_PROVIDER_SITE_OTHER): Payer: Self-pay | Admitting: Family Medicine

## 2015-07-22 ENCOUNTER — Encounter: Payer: Self-pay | Admitting: Family Medicine

## 2015-07-22 VITALS — BP 122/80 | HR 80 | Resp 16 | Ht 63.0 in | Wt 180.0 lb

## 2015-07-22 DIAGNOSIS — R7302 Impaired glucose tolerance (oral): Secondary | ICD-10-CM

## 2015-07-22 DIAGNOSIS — Z114 Encounter for screening for human immunodeficiency virus [HIV]: Secondary | ICD-10-CM

## 2015-07-22 DIAGNOSIS — E785 Hyperlipidemia, unspecified: Secondary | ICD-10-CM

## 2015-07-22 DIAGNOSIS — E66811 Obesity, class 1: Secondary | ICD-10-CM

## 2015-07-22 DIAGNOSIS — Z1159 Encounter for screening for other viral diseases: Secondary | ICD-10-CM

## 2015-07-22 DIAGNOSIS — I10 Essential (primary) hypertension: Secondary | ICD-10-CM

## 2015-07-22 DIAGNOSIS — J302 Other seasonal allergic rhinitis: Secondary | ICD-10-CM

## 2015-07-22 DIAGNOSIS — E669 Obesity, unspecified: Secondary | ICD-10-CM

## 2015-07-22 MED ORDER — TRIAMTERENE-HCTZ 37.5-25 MG PO TABS: ORAL_TABLET | ORAL | Status: DC

## 2015-07-22 NOTE — Progress Notes (Signed)
   Subjective:    Patient ID: Grace Valdez, female    DOB: Jun 21, 1966, 49 y.o.   MRN: [id number]  HPI   Grace Valdez     MRN: [id number]      DOB: Aug 28, 1966   HPI Grace Valdez is here for follow up and re-evaluation of chronic medical conditions, medication management and review of any available recent lab and radiology data.  Preventive health is updated, specifically  Cancer screening and Immunization.  Needs updating but  Currently uninsured . The PT denies any adverse reactions to current medications since the last visit.  There are no new concerns.  There are no specific complaints   ROS Denies recent fever or chills. Denies uncontrolled sinus pressure, nasal congestion, ear pain or sore throat. Denies chest congestion, productive cough or wheezing. Denies chest pains, palpitations and leg swelling Denies abdominal pain, nausea, vomiting,diarrhea or constipation.   Denies dysuria, frequency, hesitancy or incontinence. Denies joint pain, swelling and limitation in mobility. Denies headaches, seizures, numbness, or tingling. Denies depression, anxiety or insomnia. Denies skin break down or rash.   PE  BP 122/80 mmHg  Pulse 80  Resp 16  Ht 5\' 3"  (1.6 m)  Wt 180 lb (81.647 kg)  BMI 31.89 kg/m2  SpO2 98%  Patient alert and oriented and in no cardiopulmonary distress.  HEENT: No facial asymmetry, EOMI,   oropharynx pink and moist.  Neck supple no JVD, no mass.  Chest: Clear to auscultation bilaterally.  CVS: S1, S2 no murmurs, no S3.Regular rate.  ABD: Soft non tender.   Ext: No edema  MS: Adequate ROM spine, shoulders, hips and knees.  Skin: Intact, no ulcerations or rash noted.  Psych: Good eye contact, normal affect. Memory intact not anxious or depressed appearing.  CNS: CN 2-12 intact, power,  normal throughout.no focal deficits noted.   Assessment & Plan  Essential hypertension Controlled, no change in medication DASH diet and commitment to daily  physical activity for a minimum of 30 minutes discussed and encouraged, as a part of hypertension management. The importance of attaining a healthy weight is also discussed.  BP/Weight 07/22/2015 12/27/2014 07/19/2014 02/14/2014 07/27/2013 01/18/2013 5/95/6387  Systolic BP 564 332 951 884 166 063 016  Diastolic BP 80 76 80 80 80 80 74  Wt. (Lbs) 180 178 178 179 176.12 176 174  BMI 31.89 31.54 29.62 30.26 29.78 29.75 29.42        Obesity (BMI 30.0-34.9) Deteriorated. Patient re-educated about  the importance of commitment to a  minimum of 150 minutes of exercise per week.  The importance of healthy food choices with portion control discussed. Encouraged to start a food diary, count calories and to consider  joining a support group. Sample diet sheets offered. Goals set by the patient for the next several months.   Weight /BMI 07/22/2015 12/27/2014 07/19/2014  WEIGHT 180 lb 178 lb 178 lb  HEIGHT 5\' 3"  5\' 3"  5\' 5"   BMI 31.89 kg/m2 31.54 kg/m2 29.62 kg/m2      Seasonal allergies Increased symptoms with recent season change , hasa few prednisone tabs left and will use oTC medication       Review of Systems     Objective:   Physical Exam        Assessment & Plan:

## 2015-07-22 NOTE — Assessment & Plan Note (Signed)
Increased symptoms with recent season change , hasa few prednisone tabs left and will use oTC medication

## 2015-07-22 NOTE — Assessment & Plan Note (Signed)
Controlled, no change in medication DASH diet and commitment to daily physical activity for a minimum of 30 minutes discussed and encouraged, as a part of hypertension management. The importance of attaining a healthy weight is also discussed.  BP/Weight 07/22/2015 12/27/2014 07/19/2014 02/14/2014 07/27/2013 01/18/2013 5/91/6384  Systolic BP 665 993 570 177 939 030 092  Diastolic BP 80 76 80 80 80 80 74  Wt. (Lbs) 180 178 178 179 176.12 176 174  BMI 31.89 31.54 29.62 30.26 29.78 29.75 29.42

## 2015-07-22 NOTE — Assessment & Plan Note (Signed)
Deteriorated. Patient re-educated about  the importance of commitment to a  minimum of 150 minutes of exercise per week.  The importance of healthy food choices with portion control discussed. Encouraged to start a food diary, count calories and to consider  joining a support group. Sample diet sheets offered. Goals set by the patient for the next several months.   Weight /BMI 07/22/2015 12/27/2014 07/19/2014  WEIGHT 180 lb 178 lb 178 lb  HEIGHT 5\' 3"  5\' 3"  5\' 5"   BMI 31.89 kg/m2 31.54 kg/m2 29.62 kg/m2

## 2015-07-22 NOTE — Patient Instructions (Signed)
F/u in 6 month, call if you need me before  Please work on good  health habits so that your health will improve. 1. Commitment to daily physical activity for 30 to 60  minutes, if you are able to do this.  2. Commitment to wise food choices. Aim for half of your  food intake to be vegetable and fruit, one quarter starchy foods, and one quarter protein. Try to eat on a regular schedule  3 meals per day, snacking between meals should be limited to vegetables or fruits or small portions of nuts. 64 ounces of water per day is generally recommended, unless you have specific health conditions, like heart failure or kidney failure where you will need to limit fluid intake.  3. Commitment to sufficient and a  good quality of physical and mental rest daily, generally between 6 to 8 hours per day.  WITH PERSISTANCE AND PERSEVERANCE, THE IMPOSSIBLE , BECOMES THE NORM!   Thanks for choosing [facility], we consider it a privelige to serve you.  Fasting lipid, chem 7, HIV, hep C screen. TSH , CBC in January or after  Please schedule mammogram  Pls get flu vaccine

## 2016-01-10 ENCOUNTER — Other Ambulatory Visit: Payer: Self-pay | Admitting: Family Medicine

## 2016-01-10 LAB — CBC
HCT: 38.4 % (ref 35.0–45.0)
Hemoglobin: 12.8 g/dL (ref 11.7–15.5)
MCH: 30.8 pg (ref 27.0–33.0)
MCHC: 33.3 g/dL (ref 32.0–36.0)
MCV: 92.3 fL (ref 80.0–100.0)
MPV: 8.7 fL (ref 7.5–12.5)
PLATELETS: 356 10*3/uL (ref 140–400)
RBC: 4.16 MIL/uL (ref 3.80–5.10)
RDW: 14.3 % (ref 11.0–15.0)
WBC: 6.8 10*3/uL (ref 3.8–10.8)

## 2016-01-11 LAB — BASIC METABOLIC PANEL
BUN: 16 mg/dL (ref 7–25)
CHLORIDE: 102 mmol/L (ref 98–110)
CO2: 28 mmol/L (ref 20–31)
CREATININE: 0.98 mg/dL (ref 0.50–1.05)
Calcium: 9 mg/dL (ref 8.6–10.4)
GLUCOSE: 111 mg/dL — AB (ref 65–99)
Potassium: 4.1 mmol/L (ref 3.5–5.3)
Sodium: 140 mmol/L (ref 135–146)

## 2016-01-11 LAB — LIPID PANEL
CHOLESTEROL: 231 mg/dL — AB (ref 125–200)
HDL: 95 mg/dL (ref >=46)
LDL Cholesterol: 122 mg/dL (ref <130)
TRIGLYCERIDES: 70 mg/dL (ref <150)
Total CHOL/HDL Ratio: 2.4 Ratio (ref <=5.0)
VLDL: 14 mg/dL (ref <30)

## 2016-01-11 LAB — HIV ANTIBODY (ROUTINE TESTING W REFLEX): HIV: NONREACTIVE

## 2016-01-11 LAB — TSH: TSH: 2.93 m[IU]/L

## 2016-01-11 LAB — HEPATITIS C ANTIBODY: HCV AB: NEGATIVE

## 2016-01-13 LAB — HEMOGLOBIN A1C
Hgb A1c MFr Bld: 5.8 % — ABNORMAL HIGH (ref <5.7)
MEAN PLASMA GLUCOSE: 120 mg/dL

## 2016-01-20 ENCOUNTER — Encounter: Payer: Self-pay | Admitting: Family Medicine

## 2016-01-20 ENCOUNTER — Ambulatory Visit (INDEPENDENT_AMBULATORY_CARE_PROVIDER_SITE_OTHER): Payer: Self-pay | Admitting: Family Medicine

## 2016-01-20 VITALS — BP 120/84 | HR 82 | Resp 16 | Ht 64.5 in | Wt 184.0 lb

## 2016-01-20 DIAGNOSIS — R7302 Impaired glucose tolerance (oral): Secondary | ICD-10-CM

## 2016-01-20 DIAGNOSIS — J302 Other seasonal allergic rhinitis: Secondary | ICD-10-CM

## 2016-01-20 DIAGNOSIS — E669 Obesity, unspecified: Secondary | ICD-10-CM

## 2016-01-20 DIAGNOSIS — I10 Essential (primary) hypertension: Secondary | ICD-10-CM

## 2016-01-20 DIAGNOSIS — E785 Hyperlipidemia, unspecified: Secondary | ICD-10-CM

## 2016-01-20 NOTE — Patient Instructions (Addendum)
F/u with rectal in 6 month, call if you need me sooner  PLEASE change eating so cholesterol and sugar become normal  Please arrange for mammogram past DUE    Thank you  for choosing  [facility]. We consider it a privelige to serve you.  Delivering excellent health care in a caring and  compassionate way is our goal.  Partnering with you,  so that together we can achieve this goal is our strategy.  Please work on good  health habits so that your health will improve. 1. Commitment to daily physical activity for 30 to 60  minutes, if you are able to do this.  2. Commitment to wise food choices. Aim for half of your  food intake to be vegetable and fruit, one quarter starchy foods, and one quarter protein. Try to eat on a regular schedule  3 meals per day, snacking between meals should be limited to vegetables or fruits or small portions of nuts. 64 ounces of water per day is generally recommended, unless you have specific health conditions, like heart failure or kidney failure where you will need to limit fluid intake.  3. Commitment to sufficient and a  good quality of physical and mental rest daily, generally between 6 to 8 hours per day.  WITH PERSISTANCE AND PERSEVERANCE, THE IMPOSSIBLE , BECOMES THE NORM!

## 2016-01-20 NOTE — Progress Notes (Signed)
Subjective:    Patient ID: Grace Valdez, female    DOB: [birth year], 50 y.o.   MRN: [id number]  HPI   Grace Valdez     MRN: [id number]      DOB: 1966-02-26   HPI Grace Valdez is here for follow up and re-evaluation of chronic medical conditions, medication management and review of any available recent lab and radiology data.  Preventive health is updated, specifically  Cancer screening and Immunization.   Questions or concerns regarding consultations or procedures which the PT has had in the interim are  addressed. The PT denies any adverse reactions to current medications since the last visit.  There are no new concerns.  There are no specific complaints   ROS Denies recent fever or chills. Denies sinus pressure, nasal congestion, ear pain or sore throat. Denies chest congestion, productive cough or wheezing. Denies chest pains, palpitations and leg swelling Denies abdominal pain, nausea, vomiting,diarrhea or constipation.   Denies dysuria, frequency, hesitancy or incontinence. Denies joint pain, swelling and limitation in mobility. Denies headaches, seizures, numbness, or tingling. Denies depression, anxiety or insomnia. Denies skin break down or rash.   PE  BP 120/84 mmHg  Pulse 82  Resp 16  Ht 5' 4.5" (1.638 m)  Wt 184 lb (83.462 kg)  BMI 31.11 kg/m2  SpO2 98%  Patient alert and oriented and in no cardiopulmonary distress.  HEENT: No facial asymmetry, EOMI,   oropharynx pink and moist.  Neck supple no JVD, no mass.  Chest: Clear to auscultation bilaterally.  CVS: S1, S2 no murmurs, no S3.Regular rate.  ABD: Soft non tender.   Ext: No edema  MS: Adequate ROM spine, shoulders, hips and knees.  Skin: Intact, no ulcerations or rash noted.  Psych: Good eye contact, normal affect. Memory intact not anxious or depressed appearing.  CNS: CN 2-12 intact, power,  normal throughout.no focal deficits noted.   Assessment & Plan   Essential  hypertension Controlled, no change in medication DASH diet and commitment to daily physical activity for a minimum of 30 minutes discussed and encouraged, as a part of hypertension management. The importance of attaining a healthy weight is also discussed.  BP/Weight 01/20/2016 07/22/2015 12/27/2014 07/19/2014 02/14/2014 123456 Q000111Q  Systolic BP 123456 [id number]  Diastolic BP 84 80 76 80 80 80 80  Wt. (Lbs) 184 180 178 178 179 176.12 176  BMI 31.11 31.89 31.54 29.62 30.26 29.78 29.75        IGT (impaired glucose tolerance) Deteriorated Patient educated about the importance of limiting  Carbohydrate intake , the need to commit to daily physical activity for a minimum of 30 minutes , and to commit weight loss. The fact that changes in all these areas will reduce or eliminate all together the development of diabetes is stressed.   Diabetic Labs Latest Ref Rng 01/10/2016 12/21/2014 07/13/2014 12/01/2013 01/18/2013  HbA1c <5.7 % 5.8(H) - 5.5 5.9(H) 5.3  Chol 125 - 200 mg/dL 231(H) 197 - 172 192  HDL >=46 mg/dL 95 68 - 64 52  Calc LDL <130 mg/dL 122 119(H) - 99 126(H)  Triglycerides <150 mg/dL 70 48 - 45 72  Creatinine 0.50 - 1.05 mg/dL 0.98 0.92 0.96 - 1.13(H)   BP/Weight 01/20/2016 07/22/2015 12/27/2014 07/19/2014 02/14/2014 123456 [id number]  Systolic BP 123456 [id number]  Diastolic BP 84 80 76 80 80 80 80  Wt. (Lbs) 184 180 178 178 179 176.12 176  BMI 31.11  31.89 31.54 29.62 30.26 29.78 29.75   No flowsheet data found.     Dyslipidemia, goal LDL below 100 Deteriorated  Hyperlipidemia:Low fat diet discussed and encouraged.   Lipid Panel  Lab Results  Component Value Date   CHOL 231* 01/10/2016   HDL 95 01/10/2016   LDLCALC 122 01/10/2016   TRIG 70 01/10/2016   CHOLHDL 2.4 01/10/2016     '   Obesity (BMI 30.0-34.9) Deteriorated. Patient re-educated about  the importance of commitment to a  minimum of 150 minutes of exercise per week.  The  importance of healthy food choices with portion control discussed. Encouraged to start a food diary, count calories and to consider  joining a support group. Sample diet sheets offered. Goals set by the patient for the next several months.   Weight /BMI 01/20/2016 07/22/2015 12/27/2014  WEIGHT 184 lb 180 lb 178 lb  HEIGHT 5' 4.5" 5\' 3"  5\' 3"   BMI 31.11 kg/m2 31.89 kg/m2 31.54 kg/m2    Current exercise per week 90 minutes.   Seasonal allergies Controlled, no change in medication        Review of Systems     Objective:   Physical Exam        Assessment & Plan:

## 2016-01-21 ENCOUNTER — Other Ambulatory Visit: Payer: Self-pay

## 2016-01-21 MED ORDER — LORATADINE 10 MG PO TABS: 10.0000 mg | ORAL_TABLET | Freq: Every day | ORAL | Status: DC

## 2016-01-21 MED ORDER — TRIAMTERENE-HCTZ 37.5-25 MG PO TABS: ORAL_TABLET | ORAL | Status: DC

## 2016-01-25 NOTE — Assessment & Plan Note (Signed)
Deteriorated  Hyperlipidemia:Low fat diet discussed and encouraged.   Lipid Panel  Lab Results  Component Value Date   CHOL 231* 01/10/2016   HDL 95 01/10/2016   LDLCALC 122 01/10/2016   TRIG 70 01/10/2016   CHOLHDL 2.4 01/10/2016     '

## 2016-01-25 NOTE — Assessment & Plan Note (Signed)
Deteriorated Patient educated about the importance of limiting  Carbohydrate intake , the need to commit to daily physical activity for a minimum of 30 minutes , and to commit weight loss. The fact that changes in all these areas will reduce or eliminate all together the development of diabetes is stressed.   Diabetic Labs Latest Ref Rng 01/10/2016 12/21/2014 07/13/2014 12/01/2013 01/18/2013  HbA1c <5.7 % 5.8(H) - 5.5 5.9(H) 5.3  Chol 125 - 200 mg/dL 231(H) 197 - 172 192  HDL >=46 mg/dL 95 68 - 64 52  Calc LDL <130 mg/dL 122 119(H) - 99 126(H)  Triglycerides <150 mg/dL 70 48 - 45 72  Creatinine 0.50 - 1.05 mg/dL 0.98 0.92 0.96 - 1.13(H)   BP/Weight 01/20/2016 07/22/2015 12/27/2014 07/19/2014 02/14/2014 123456 Q000111Q  Systolic BP 123456 [id number] 0000000 [id number]  Diastolic BP 84 80 76 80 80 80 80  Wt. (Lbs) 184 180 178 178 179 176.12 176  BMI 31.11 31.89 31.54 29.62 30.26 29.78 29.75   No flowsheet data found.

## 2016-01-25 NOTE — Assessment & Plan Note (Signed)
Deteriorated. Patient re-educated about  the importance of commitment to a  minimum of 150 minutes of exercise per week.  The importance of healthy food choices with portion control discussed. Encouraged to start a food diary, count calories and to consider  joining a support group. Sample diet sheets offered. Goals set by the patient for the next several months.   Weight /BMI 01/20/2016 07/22/2015 12/27/2014  WEIGHT 184 lb 180 lb 178 lb  HEIGHT 5' 4.5" 5\' 3"  5\' 3"   BMI 31.11 kg/m2 31.89 kg/m2 31.54 kg/m2    Current exercise per week 90 minutes.

## 2016-01-25 NOTE — Assessment & Plan Note (Signed)
Controlled, no change in medication  

## 2016-01-25 NOTE — Assessment & Plan Note (Signed)
Controlled, no change in medication DASH diet and commitment to daily physical activity for a minimum of 30 minutes discussed and encouraged, as a part of hypertension management. The importance of attaining a healthy weight is also discussed.  BP/Weight 01/20/2016 07/22/2015 12/27/2014 07/19/2014 02/14/2014 123456 Q000111Q  Systolic BP 123456 [id number]  Diastolic BP 84 80 76 80 80 80 80  Wt. (Lbs) 184 180 178 178 179 176.12 176  BMI 31.11 31.89 31.54 29.62 30.26 29.78 29.75

## 2016-06-23 ENCOUNTER — Other Ambulatory Visit: Payer: Self-pay | Admitting: Family Medicine

## 2016-06-23 DIAGNOSIS — Z1231 Encounter for screening mammogram for malignant neoplasm of breast: Secondary | ICD-10-CM

## 2016-06-26 ENCOUNTER — Ambulatory Visit (HOSPITAL_COMMUNITY)
Admission: RE | Admit: 2016-06-26 | Discharge: 2016-06-26 | Disposition: A | Discharge status: Home / Self Care | Payer: Self-pay | Source: Ambulatory Visit | Attending: Family Medicine | Admitting: Family Medicine

## 2016-06-26 DIAGNOSIS — Z1231 Encounter for screening mammogram for malignant neoplasm of breast: Secondary | ICD-10-CM

## 2016-08-04 ENCOUNTER — Ambulatory Visit: Payer: Self-pay | Admitting: Family Medicine

## 2016-09-02 ENCOUNTER — Ambulatory Visit (INDEPENDENT_AMBULATORY_CARE_PROVIDER_SITE_OTHER): Payer: 59 | Admitting: Family Medicine

## 2016-09-02 ENCOUNTER — Encounter: Payer: Self-pay | Admitting: Family Medicine

## 2016-09-02 VITALS — BP 116/68 | HR 64 | Temp 98.8°F | Resp 18 | Ht 64.5 in | Wt 191.1 lb

## 2016-09-02 DIAGNOSIS — I10 Essential (primary) hypertension: Secondary | ICD-10-CM

## 2016-09-02 DIAGNOSIS — Z1211 Encounter for screening for malignant neoplasm of colon: Secondary | ICD-10-CM | POA: Diagnosis not present

## 2016-09-02 DIAGNOSIS — R7302 Impaired glucose tolerance (oral): Secondary | ICD-10-CM | POA: Diagnosis not present

## 2016-09-02 DIAGNOSIS — Z0000 Encounter for general adult medical examination without abnormal findings: Secondary | ICD-10-CM

## 2016-09-02 DIAGNOSIS — Z2821 Immunization not carried out because of patient refusal: Secondary | ICD-10-CM

## 2016-09-02 DIAGNOSIS — E669 Obesity, unspecified: Secondary | ICD-10-CM

## 2016-09-02 LAB — POC HEMOCCULT BLD/STL (OFFICE/1-CARD/DIAGNOSTIC): FECAL OCCULT BLD: NEGATIVE

## 2016-09-02 NOTE — Assessment & Plan Note (Signed)
Re educated on benefit, hopefully she will obtain vaccine

## 2016-09-02 NOTE — Patient Instructions (Addendum)
F/u in 5.5 month, call if you need me sooner  Labs today lipid, chem 7 nd hBA1C  Please log into my chart Reconsider flu vaccine please  Change eating habits as discussed and commit to daily exercise for improved health  Thank you  for choosing [facility]. We consider it a privelige to serve you.  Delivering excellent health care in a caring and  compassionate way is our goal.  Partnering with you,  so that together we can achieve this goal is our strategy.

## 2016-09-02 NOTE — Progress Notes (Signed)
    Grace Valdez     MRN: [id number]      DOB: 20-Nov-1965  HPI: Patient is in for annual physical exam. No other health concerns are expressed or addressed at the visit. . Immunization is reviewed , re educated re need for flu vaccine, still "on hold"   PE:  BP 116/68 (BP Location: Left Arm, Patient Position: Sitting, Cuff Size: Normal)   Pulse 64   Temp 98.8 F (37.1 C) (Oral)   Resp 18   Ht 5' 4.5" (1.638 m)   Wt 191 lb 1.3 oz (86.7 kg)   LMP 01/24/2013   SpO2 96%   BMI 32.29 kg/m   Pleasant  female, alert and oriented x 3, in no cardio-pulmonary distress. Afebrile. HEENT No facial trauma or asymetry. Sinuses non tender.  Extra occullar muscles intact, pupils equally reactive to light. External ears normal, tympanic membranes clear. Oropharynx moist, no exudate. Neck: supple, no adenopathy,JVD or thyromegaly.No bruits.  Chest: Clear to ascultation bilaterally.No crackles or wheezes. Non tender to palpation  Breast: No asymetry,no masses or lumps. No tenderness. No nipple discharge or inversion. No axillary or supraclavicular adenopathy  Cardiovascular system; Heart sounds normal,  S1 and  S2 ,no S3.  No murmur, or thrill. Apical beat not displaced Peripheral pulses normal.  Abdomen: Soft, non tender, no organomegaly or masses. No bruits. Bowel sounds normal. No guarding, tenderness or rebound.  Rectal:  Normal sphincter tone. No rectal mass. Guaiac negative stool.  GU: External genitalia normal female genitalia , normal female distribution of hair. No lesions. Urethral meatus normal in size, no  Prolapse, no lesions visibly  Present. Bladder non tender. Vagina pink and moist , with no visible lesions , discharge present . Adequate pelvic support no  cystocele or rectocele noted Cervix pink and appears healthy, no lesions or ulcerations noted, no discharge noted from os Uterus normal size, no adnexal masses, no cervical motion or adnexal  tenderness.   Musculoskeletal exam: Full ROM of spine, hips , shoulders and knees. No deformity ,swelling or crepitus noted. No muscle wasting or atrophy.   Neurologic: Cranial nerves 2 to 12 intact. Power, tone ,sensation and reflexes normal throughout. No disturbance in gait. No tremor.  Skin: Intact, no ulceration, erythema , scaling or rash noted. Pigmentation normal throughout  Psych; Normal mood and affect. Judgement and concentration normal   Assessment & Plan:  Annual physical exam Annual exam as documented. Counseling done  re healthy lifestyle involving commitment to 150 minutes exercise per week, heart healthy diet, and attaining healthy weight.The importance of adequate sleep also discussed. Regular seat belt use and home safety, is also discussed. Changes in health habits are decided on by the patient with goals and time frames  set for achieving them. Immunization and cancer screening needs are specifically addressed at this visit.

## 2016-09-02 NOTE — Assessment & Plan Note (Signed)
Deteriorated. Patient re-educated about  the importance of commitment to a  minimum of 150 minutes of exercise per week.  The importance of healthy food choices with portion control discussed. Encouraged to start a food diary, count calories and to consider  joining a support group. Sample diet sheets offered. Goals set by the patient for the next several months.   Weight /BMI 09/02/2016 01/20/2016 07/22/2015  WEIGHT 191 lb 1.3 oz 184 lb 180 lb  HEIGHT 5' 4.5" 5' 4.5" 5\' 3"   BMI 32.29 kg/m2 31.11 kg/m2 31.89 kg/m2

## 2016-09-02 NOTE — Assessment & Plan Note (Signed)

## 2016-09-02 NOTE — Addendum Note (Signed)
Addended by: Eual Fines on: 09/02/2016 02:09 PM   Modules accepted: Orders

## 2016-09-03 LAB — HEMOGLOBIN A1C
HEMOGLOBIN A1C: 5.6 % (ref <5.7)
MEAN PLASMA GLUCOSE: 114 mg/dL

## 2016-09-03 LAB — BASIC METABOLIC PANEL WITH GFR
BUN: 13 mg/dL (ref 7–25)
CHLORIDE: 104 mmol/L (ref 98–110)
CO2: 30 mmol/L (ref 20–31)
Calcium: 9.3 mg/dL (ref 8.6–10.4)
Creat: 0.87 mg/dL (ref 0.50–1.05)
GFR, EST AFRICAN AMERICAN: 89 mL/min (ref >=60)
GFR, EST NON AFRICAN AMERICAN: 77 mL/min (ref >=60)
Glucose, Bld: 91 mg/dL (ref 65–99)
POTASSIUM: 4.1 mmol/L (ref 3.5–5.3)
SODIUM: 141 mmol/L (ref 135–146)

## 2016-09-03 LAB — LIPID PANEL
Cholesterol: 192 mg/dL (ref <200)
HDL: 70 mg/dL (ref >50)
LDL CALC: 107 mg/dL — AB (ref <100)
TRIGLYCERIDES: 75 mg/dL (ref <150)
Total CHOL/HDL Ratio: 2.7 Ratio (ref <5.0)
VLDL: 15 mg/dL (ref <30)

## 2017-02-17 ENCOUNTER — Other Ambulatory Visit (HOSPITAL_COMMUNITY)
Admission: RE | Admit: 2017-02-17 | Discharge: 2017-02-17 | Disposition: A | Discharge status: Home / Self Care | Payer: 59 | Source: Ambulatory Visit | Attending: Family Medicine | Admitting: Family Medicine

## 2017-02-17 ENCOUNTER — Encounter: Payer: Self-pay | Admitting: Family Medicine

## 2017-02-17 ENCOUNTER — Ambulatory Visit (INDEPENDENT_AMBULATORY_CARE_PROVIDER_SITE_OTHER): Payer: 59 | Admitting: Family Medicine

## 2017-02-17 VITALS — BP 104/64 | HR 74 | Resp 16 | Ht 65.0 in | Wt 188.0 lb

## 2017-02-17 DIAGNOSIS — N760 Acute vaginitis: Secondary | ICD-10-CM | POA: Diagnosis not present

## 2017-02-17 DIAGNOSIS — E66811 Obesity, class 1: Secondary | ICD-10-CM

## 2017-02-17 DIAGNOSIS — I10 Essential (primary) hypertension: Secondary | ICD-10-CM

## 2017-02-17 DIAGNOSIS — E785 Hyperlipidemia, unspecified: Secondary | ICD-10-CM

## 2017-02-17 DIAGNOSIS — E669 Obesity, unspecified: Secondary | ICD-10-CM

## 2017-02-17 DIAGNOSIS — Z1211 Encounter for screening for malignant neoplasm of colon: Secondary | ICD-10-CM

## 2017-02-17 LAB — CBC
HEMATOCRIT: 37.3 % (ref 35.0–45.0)
HEMOGLOBIN: 12.6 g/dL (ref 11.7–15.5)
MCH: 30.8 pg (ref 27.0–33.0)
MCHC: 33.8 g/dL (ref 32.0–36.0)
MCV: 91.2 fL (ref 80.0–100.0)
MPV: 8.4 fL (ref 7.5–12.5)
Platelets: 351 10*3/uL (ref 140–400)
RBC: 4.09 MIL/uL (ref 3.80–5.10)
RDW: 14.5 % (ref 11.0–15.0)
WBC: 6.8 10*3/uL (ref 3.8–10.8)

## 2017-02-17 LAB — BASIC METABOLIC PANEL
BUN: 15 mg/dL (ref 7–25)
CHLORIDE: 106 mmol/L (ref 98–110)
CO2: 28 mmol/L (ref 20–31)
CREATININE: 1 mg/dL (ref 0.50–1.05)
Calcium: 8.8 mg/dL (ref 8.6–10.4)
Glucose, Bld: 100 mg/dL — ABNORMAL HIGH (ref 65–99)
POTASSIUM: 4.2 mmol/L (ref 3.5–5.3)
Sodium: 141 mmol/L (ref 135–146)

## 2017-02-17 LAB — LIPID PANEL
Cholesterol: 221 mg/dL — ABNORMAL HIGH (ref <200)
HDL: 71 mg/dL (ref >50)
LDL CALC: 130 mg/dL — AB (ref <100)
TRIGLYCERIDES: 99 mg/dL (ref <150)
Total CHOL/HDL Ratio: 3.1 Ratio (ref <5.0)
VLDL: 20 mg/dL (ref <30)

## 2017-02-17 LAB — TSH: TSH: 2.95 mIU/L

## 2017-02-17 NOTE — Assessment & Plan Note (Signed)
Specimens sent for testing per pt request

## 2017-02-17 NOTE — Progress Notes (Signed)
   Grace Valdez     MRN: [id number]      DOB: February 25, 1966   HPI [name] is here for follow up and re-evaluation of chronic medical conditions, medication management and review of any available recent lab and radiology data.  Preventive health is updated, specifically  Cancer screening and Immunization.    The PT denies any adverse reactions to current medications since the last visit.  Pelvic and pap request also for STD   ROS Denies recent fever or chills. Denies sinus pressure, nasal congestion, ear pain or sore throat. Denies chest congestion, productive cough or wheezing. Denies chest pains, palpitations and leg swelling Denies abdominal pain, nausea, vomiting,diarrhea or constipation.   Denies dysuria, frequency, hesitancy or incontinence. Denies joint pain, swelling and limitation in mobility. Denies headaches, seizures, numbness, or tingling. Denies depression, anxiety or insomnia. Denies skin break down or rash.   PE  BP 104/64   Pulse 74   Resp 16   Ht 5\' 5"  (1.651 m)   Wt 188 lb (85.3 kg)   LMP 01/24/2013   SpO2 96%   BMI 31.28 kg/m   Patient alert and oriented and in no cardiopulmonary distress.  HEENT: No facial asymmetry, EOMI,   oropharynx pink and moist.  Neck supple no JVD, no mass.  Chest: Clear to auscultation bilaterally.  CVS: S1, S2 no murmurs, no S3.Regular rate.  ABD: Soft non tender.   Rectal: no mass, heme negative stool Pelvic: uterus normal size, white vaginal discharge, no adnexal masses, no vaginal ulcers, no cervical motion or adnexal tenderness  Ext: No edema  MS: Adequate ROM spine, shoulders, hips and knees.  Skin: Intact, no ulcerations or rash noted.  Psych: Good eye contact, normal affect. Memory intact not anxious or depressed appearing.  CNS: CN 2-12 intact, power,  normal throughout.no focal deficits noted.   Assessment & Plan  Essential hypertension Controlled, no change in medication DASH diet and commitment to  daily physical activity for a minimum of 30 minutes discussed and encouraged, as a part of hypertension management. The importance of attaining a healthy weight is also discussed.  BP/Weight 02/17/2017 09/02/2016 01/20/2016 07/22/2015 12/27/2014 35/11/2990 12/14/6832  Systolic BP 196 222 979 892 119 417 408  Diastolic BP 64 68 84 80 76 80 80  Wt. (Lbs) 188 191.08 184 180 178 178 179  BMI 31.28 32.29 31.11 31.89 31.54 29.62 30.26       Obesity (BMI 30.0-34.9) . Improved Patient re-educated about  the importance of commitment to a  minimum of 150 minutes of exercise per week.  The importance of healthy food choices with portion control discussed. Encouraged to start a food diary, count calories and to consider  joining a support group. Sample diet sheets offered. Goals set by the patient for the next several months.   Weight /BMI 02/17/2017 09/02/2016 01/20/2016  WEIGHT 188 lb 191 lb 1.3 oz 184 lb  HEIGHT 5\' 5"  5' 4.5" 5' 4.5"  BMI 31.28 kg/m2 32.29 kg/m2 31.11 kg/m2      Colon cancer screening Rectal exam heme negative stool and no mass  Vulvovaginitis Specimens sent for testing per pt request

## 2017-02-17 NOTE — Patient Instructions (Signed)
F/u in 6 month, call if you need me sooner  Fasting labs today  No change in medications     It is important that you exercise regularly at least 30 minutes 5 times a week. If you develop chest pain, have severe difficulty breathing, or feel very tired, stop exercising immediately and seek medical attention     Please work on good  health habits so that your health will improve. 1. Commitment to daily physical activity for 30 to 60  minutes, if you are able to do this.  2. Commitment to wise food choices. Aim for half of your  food intake to be vegetable and fruit, one quarter starchy foods, and one quarter protein. Try to eat on a regular schedule  3 meals per day, snacking between meals should be limited to vegetables or fruits or small portions of nuts. 64 ounces of water per day is generally recommended, unless you have specific health conditions, like heart failure or kidney failure where you will need to limit fluid intake.  3. Commitment to sufficient and a  good quality of physical and mental rest daily, generally between 6 to 8 hours per day.  WITH PERSISTANCE AND PERSEVERANCE, THE IMPOSSIBLE , BECOMES THE NORM!  Thank you  for choosing [facility]. We consider it a privelige to serve you.  Delivering excellent health care in a caring and  compassionate way is our goal.  Partnering with you,  so that together we can achieve this goal is our strategy.

## 2017-02-17 NOTE — Assessment & Plan Note (Signed)
.   Improved Patient re-educated about  the importance of commitment to a  minimum of 150 minutes of exercise per week.  The importance of healthy food choices with portion control discussed. Encouraged to start a food diary, count calories and to consider  joining a support group. Sample diet sheets offered. Goals set by the patient for the next several months.   Weight /BMI 02/17/2017 09/02/2016 01/20/2016  WEIGHT 188 lb 191 lb 1.3 oz 184 lb  HEIGHT 5\' 5"  5' 4.5" 5' 4.5"  BMI 31.28 kg/m2 32.29 kg/m2 31.11 kg/m2

## 2017-02-17 NOTE — Assessment & Plan Note (Signed)
Controlled, no change in medication DASH diet and commitment to daily physical activity for a minimum of 30 minutes discussed and encouraged, as a part of hypertension management. The importance of attaining a healthy weight is also discussed.  BP/Weight 02/17/2017 09/02/2016 01/20/2016 07/22/2015 12/27/2014 67/09/2456 0/05/9832  Systolic BP 825 053 976 734 193 790 240  Diastolic BP 64 68 84 80 76 80 80  Wt. (Lbs) 188 191.08 184 180 178 178 179  BMI 31.28 32.29 31.11 31.89 31.54 29.62 30.26

## 2017-02-17 NOTE — Assessment & Plan Note (Signed)
Rectal exam heme negative stool and no mass

## 2017-02-18 LAB — VITAMIN D 25 HYDROXY (VIT D DEFICIENCY, FRACTURES): Vit D, 25-Hydroxy: 17 ng/mL — ABNORMAL LOW (ref 30–100)

## 2017-02-18 LAB — CERVICOVAGINAL ANCILLARY ONLY
Bacterial vaginitis: POSITIVE — AB
Candida vaginitis: NEGATIVE
Chlamydia: NEGATIVE
Neisseria Gonorrhea: NEGATIVE
Trichomonas: POSITIVE — AB

## 2017-02-19 ENCOUNTER — Other Ambulatory Visit: Payer: Self-pay | Admitting: Family Medicine

## 2017-02-19 ENCOUNTER — Telehealth: Payer: Self-pay

## 2017-02-19 ENCOUNTER — Other Ambulatory Visit: Payer: Self-pay

## 2017-02-19 DIAGNOSIS — E785 Hyperlipidemia, unspecified: Secondary | ICD-10-CM

## 2017-02-19 DIAGNOSIS — I10 Essential (primary) hypertension: Secondary | ICD-10-CM

## 2017-02-19 MED ORDER — ERGOCALCIFEROL 1.25 MG (50000 UT) PO CAPS: 50000.0000 [IU] | ORAL_CAPSULE | ORAL | 1 refills | Status: DC

## 2017-02-19 MED ORDER — METRONIDAZOLE 500 MG PO TABS: 500.0000 mg | ORAL_TABLET | Freq: Two times a day (BID) | ORAL | 0 refills | Status: DC

## 2017-02-19 NOTE — Telephone Encounter (Signed)
-----   Message from Fayrene Helper, MD sent at 02/19/2017 10:49 AM EDT ----- Please  advise vit D is low, and erx Vit D3 50,000 IU once weekly #4 refill 5, and let pt know . (entered hisotrocally)   Has bV and trichomonas, partner needs treatment for trich, both ae treated with 1 week of flagyll I have entered historically  Needs to reduce fried and fatty food intake and reduce sweets and carbs , inrceased cholesterol and FBG, review numbers pls  NEEDS 6 month follow up, ( already scheduled) needs rept fasting lipid, chem 7 and vit D level in 5.5 months pls order and mail to her and let her know

## 2017-02-23 LAB — CYTOLOGY - PAP
DIAGNOSIS: NEGATIVE
HPV: NOT DETECTED

## 2017-03-08 ENCOUNTER — Other Ambulatory Visit: Payer: Self-pay | Admitting: Family Medicine

## 2017-06-01 ENCOUNTER — Other Ambulatory Visit: Payer: Self-pay | Admitting: Family Medicine

## 2017-06-01 DIAGNOSIS — Z1231 Encounter for screening mammogram for malignant neoplasm of breast: Secondary | ICD-10-CM

## 2017-06-04 ENCOUNTER — Other Ambulatory Visit: Payer: Self-pay | Admitting: Family Medicine

## 2017-06-04 NOTE — Telephone Encounter (Signed)
Seen 6 6 18 

## 2017-06-30 ENCOUNTER — Ambulatory Visit (HOSPITAL_COMMUNITY)
Admission: RE | Admit: 2017-06-30 | Discharge: 2017-06-30 | Disposition: A | Discharge status: Home / Self Care | Payer: 59 | Source: Ambulatory Visit | Attending: Family Medicine | Admitting: Family Medicine

## 2017-06-30 DIAGNOSIS — Z1231 Encounter for screening mammogram for malignant neoplasm of breast: Secondary | ICD-10-CM | POA: Diagnosis not present

## 2017-08-19 ENCOUNTER — Ambulatory Visit: Payer: 59 | Admitting: Family Medicine

## 2017-08-31 ENCOUNTER — Encounter: Payer: Self-pay | Admitting: Family Medicine

## 2017-08-31 ENCOUNTER — Other Ambulatory Visit (HOSPITAL_COMMUNITY)
Admission: RE | Admit: 2017-08-31 | Discharge: 2017-08-31 | Disposition: A | Discharge status: Home / Self Care | Payer: 59 | Source: Ambulatory Visit | Attending: Family Medicine | Admitting: Family Medicine

## 2017-08-31 ENCOUNTER — Ambulatory Visit: Payer: 59 | Admitting: Family Medicine

## 2017-08-31 VITALS — BP 128/82 | HR 84 | Resp 16 | Ht 65.0 in | Wt 187.0 lb

## 2017-08-31 DIAGNOSIS — I10 Essential (primary) hypertension: Secondary | ICD-10-CM | POA: Diagnosis not present

## 2017-08-31 DIAGNOSIS — N760 Acute vaginitis: Secondary | ICD-10-CM | POA: Insufficient documentation

## 2017-08-31 DIAGNOSIS — R7302 Impaired glucose tolerance (oral): Secondary | ICD-10-CM | POA: Diagnosis not present

## 2017-08-31 DIAGNOSIS — E669 Obesity, unspecified: Secondary | ICD-10-CM | POA: Diagnosis not present

## 2017-08-31 MED ORDER — TRIAMTERENE-HCTZ 37.5-25 MG PO TABS: 1.0000 | ORAL_TABLET | Freq: Every day | ORAL | 1 refills | Status: DC

## 2017-08-31 NOTE — Assessment & Plan Note (Signed)
Controlled, no change in medication DASH diet and commitment to daily physical activity for a minimum of 30 minutes discussed and encouraged, as a part of hypertension management. The importance of attaining a healthy weight is also discussed.  BP/Weight 08/31/2017 02/17/2017 09/02/2016 01/20/2016 07/22/2015 12/27/2014 64/02/8031  Systolic BP 122 482 500 370 488 891 694  Diastolic BP 82 64 68 84 80 76 80  Wt. (Lbs) 187 188 191.08 184 180 178 178  BMI 31.12 31.28 32.29 31.11 31.89 31.54 29.62

## 2017-08-31 NOTE — Patient Instructions (Addendum)
Annual physical exam in 6 months  Specimens sent for testing   Fasting lipid, chem 7 and vitamin D level in 5,5 monhts  Please work on good  health habits so that your health will improve. 1. Commitment to daily physical activity for 30 to 60  minutes, if you are able to do this.  2. Commitment to wise food choices. Aim for half of your  food intake to be vegetable and fruit, one quarter starchy foods, and one quarter protein. Try to eat on a regular schedule  3 meals per day, snacking between meals should be limited to vegetables or fruits or small portions of nuts. 64 ounces of water per day is generally recommended, unless you have specific health conditions, like heart failure or kidney failure where you will need to limit fluid intake.  3. Commitment to sufficient and a  good quality of physical and mental rest daily, generally between 6 to 8 hours per day.  WITH PERSISTANCE AND PERSEVERANCE, THE IMPOSSIBLE , BECOMES THE NORM! It is important that you exercise regularly at least 30 minutes 5 times a week. If you develop chest pain, have severe difficulty breathing, or feel very tired, stop exercising immediately and seek medical attention

## 2017-08-31 NOTE — Assessment & Plan Note (Signed)
Unchanged Patient re-educated about  the importance of commitment to a  minimum of 150 minutes of exercise per week.  The importance of healthy food choices with portion control discussed. Encouraged to start a food diary, count calories and to consider  joining a support group. Sample diet sheets offered. Goals set by the patient for the next several months.   Weight /BMI 08/31/2017 02/17/2017 09/02/2016  WEIGHT 187 lb 188 lb 191 lb 1.3 oz  HEIGHT 5\' 5"  5\' 5"  5' 4.5"  BMI 31.12 kg/m2 31.28 kg/m2 32.29 kg/m2

## 2017-08-31 NOTE — Assessment & Plan Note (Signed)
asymptomatic , but had not ben in the past,partner has not been treated and wants re testing for trichomonas, specimen is sent

## 2017-09-02 LAB — CERVICOVAGINAL ANCILLARY ONLY
Chlamydia: NEGATIVE
Neisseria Gonorrhea: NEGATIVE
WET PREP (BD AFFIRM): NEGATIVE

## 2017-09-03 ENCOUNTER — Telehealth: Payer: Self-pay | Admitting: Family Medicine

## 2017-09-03 NOTE — Telephone Encounter (Signed)
Pt called in advising that someone called her on 12-20 and did not leave a message. She said it could be regarding her labs that was just done. I advised her that I would have the Nurse in the office look at the test and if there was something she needed to be aware of we would call her. If everything was OK we WOULD NOT CALL her.

## 2017-09-06 ENCOUNTER — Encounter: Payer: Self-pay | Admitting: Family Medicine

## 2017-09-06 NOTE — Progress Notes (Signed)
Grace Valdez     MRN: [id number]      DOB: 03-29-1966   HPI [name] is here for follow up and re-evaluation of chronic medical conditions, medication management and review of any available recent lab and radiology data.  Preventive health is updated, specifically  Cancer screening and Immunization.   Questions or concerns regarding consultations or procedures which the PT has had in the interim are  addressed. The PT denies any adverse reactions to current medications since the last visit.  Wants to be re tested for trichomonas, states this is the first time she has had an STD and wants to know it has been cured, has a new partner in the past 2 years, states he was told he had no infection, she is currently asymptomatic, had not been asymptomatic when diagnosed   ROS Denies recent fever or chills. Denies sinus pressure, nasal congestion, ear pain or sore throat. Denies chest congestion, productive cough or wheezing. Denies chest pains, palpitations and leg swelling Denies abdominal pain, nausea, vomiting,diarrhea or constipation.   Denies dysuria, frequency, hesitancy or incontinence. Denies joint pain, swelling and limitation in mobility. Denies headaches, seizures, numbness, or tingling. Denies depression, anxiety or insomnia. Denies skin break down or rash.   PE  BP 128/82   Pulse 84   Resp 16   Ht 5\' 5"  (1.651 m)   Wt 187 lb (84.8 kg)   LMP 01/24/2013   SpO2 98%   BMI 31.12 kg/m   Patient alert and oriented and in no cardiopulmonary distress.  HEENT: No facial asymmetry, EOMI,   oropharynx pink and moist.  Neck supple no JVD, no mass.  Chest: Clear to auscultation bilaterally.  CVS: S1, S2 no murmurs, no S3.Regular rate.  ABD: Soft non tender.   Ext: No edema  MS: Adequate ROM spine, shoulders, hips and knees.  Skin: Intact, no ulcerations or rash noted.  Psych: Good eye contact, normal affect. Memory intact not anxious or depressed appearing.  CNS: CN  2-12 intact, power,  normal throughout.no focal deficits noted.   Assessment & Plan  Essential hypertension Controlled, no change in medication DASH diet and commitment to daily physical activity for a minimum of 30 minutes discussed and encouraged, as a part of hypertension management. The importance of attaining a healthy weight is also discussed.  BP/Weight 08/31/2017 02/17/2017 09/02/2016 01/20/2016 07/22/2015 12/27/2014 63/09/4968  Systolic BP 263 785 885 027 741 287 867  Diastolic BP 82 64 68 84 80 76 80  Wt. (Lbs) 187 188 191.08 184 180 178 178  BMI 31.12 31.28 32.29 31.11 31.89 31.54 29.62       Vulvovaginitis asymptomatic , but had not ben in the past,partner has not been treated and wants re testing for trichomonas, specimen is sent  Obesity (BMI 30.0-34.9) Unchanged Patient re-educated about  the importance of commitment to a  minimum of 150 minutes of exercise per week.  The importance of healthy food choices with portion control discussed. Encouraged to start a food diary, count calories and to consider  joining a support group. Sample diet sheets offered. Goals set by the patient for the next several months.   Weight /BMI 08/31/2017 02/17/2017 09/02/2016  WEIGHT 187 lb 188 lb 191 lb 1.3 oz  HEIGHT 5\' 5"  5\' 5"  5' 4.5"  BMI 31.12 kg/m2 31.28 kg/m2 32.29 kg/m2      IGT (impaired glucose tolerance) Patient educated about the importance of limiting  Carbohydrate intake , the need to commit  to daily physical activity for a minimum of 30 minutes , and to commit weight loss. The fact that changes in all these areas will reduce or eliminate all together the development of diabetes is stressed.  Corrected HBA1C since 2017 which is great  Diabetic Labs Latest Ref Rng & Units 02/17/2017 09/02/2016 01/10/2016 12/21/2014 07/13/2014  HbA1c <5.7 % - 5.6 5.8(H) - 5.5  Chol <200 mg/dL 221(H) 192 231(H) 197 -  HDL >50 mg/dL 71 70 95 68 -  Calc LDL <100 mg/dL 130(H) 107(H) 122 119(H) -    Triglycerides <150 mg/dL 99 75 70 48 -  Creatinine 0.50 - 1.05 mg/dL 1.00 0.87 0.98 0.92 0.96   BP/Weight 08/31/2017 02/17/2017 09/02/2016 01/20/2016 07/22/2015 12/27/2014 32/11/5571  Systolic BP 220 254 270 623 762 831 517  Diastolic BP 82 64 68 84 80 76 80  Wt. (Lbs) 187 188 191.08 184 180 178 178  BMI 31.12 31.28 32.29 31.11 31.89 31.54 29.62   No flowsheet data found.

## 2017-09-06 NOTE — Assessment & Plan Note (Signed)
Patient educated about the importance of limiting  Carbohydrate intake , the need to commit to daily physical activity for a minimum of 30 minutes , and to commit weight loss. The fact that changes in all these areas will reduce or eliminate all together the development of diabetes is stressed.  Corrected HBA1C since 2017 which is great  Diabetic Labs Latest Ref Rng & Units 02/17/2017 09/02/2016 01/10/2016 12/21/2014 07/13/2014  HbA1c <5.7 % - 5.6 5.8(H) - 5.5  Chol <200 mg/dL 221(H) 192 231(H) 197 -  HDL >50 mg/dL 71 70 95 68 -  Calc LDL <100 mg/dL 130(H) 107(H) 122 119(H) -  Triglycerides <150 mg/dL 99 75 70 48 -  Creatinine 0.50 - 1.05 mg/dL 1.00 0.87 0.98 0.92 0.96   BP/Weight 08/31/2017 02/17/2017 09/02/2016 01/20/2016 07/22/2015 12/27/2014 06/16/1593  Systolic BP 585 929 244 628 638 177 116  Diastolic BP 82 64 68 84 80 76 80  Wt. (Lbs) 187 188 191.08 184 180 178 178  BMI 31.12 31.28 32.29 31.11 31.89 31.54 29.62   No flowsheet data found.

## 2018-02-21 ENCOUNTER — Encounter: Payer: 59 | Admitting: Family Medicine

## 2018-02-24 DIAGNOSIS — J0100 Acute maxillary sinusitis, unspecified: Secondary | ICD-10-CM | POA: Diagnosis not present

## 2018-02-24 DIAGNOSIS — J029 Acute pharyngitis, unspecified: Secondary | ICD-10-CM | POA: Diagnosis not present

## 2018-02-24 DIAGNOSIS — J020 Streptococcal pharyngitis: Secondary | ICD-10-CM | POA: Diagnosis not present

## 2018-03-29 ENCOUNTER — Other Ambulatory Visit: Payer: Self-pay | Admitting: Family Medicine

## 2018-03-29 ENCOUNTER — Encounter: Payer: Self-pay | Admitting: Family Medicine

## 2018-03-29 ENCOUNTER — Ambulatory Visit (INDEPENDENT_AMBULATORY_CARE_PROVIDER_SITE_OTHER): Payer: 59 | Admitting: Family Medicine

## 2018-03-29 ENCOUNTER — Telehealth: Payer: Self-pay

## 2018-03-29 ENCOUNTER — Other Ambulatory Visit (HOSPITAL_COMMUNITY)
Admission: RE | Admit: 2018-03-29 | Discharge: 2018-03-29 | Disposition: A | Discharge status: Home / Self Care | Payer: 59 | Source: Ambulatory Visit | Attending: Family Medicine | Admitting: Family Medicine

## 2018-03-29 VITALS — BP 152/94 | HR 67 | Ht 64.0 in | Wt 195.0 lb

## 2018-03-29 DIAGNOSIS — Z0000 Encounter for general adult medical examination without abnormal findings: Secondary | ICD-10-CM | POA: Diagnosis present

## 2018-03-29 DIAGNOSIS — E785 Hyperlipidemia, unspecified: Secondary | ICD-10-CM | POA: Diagnosis not present

## 2018-03-29 DIAGNOSIS — Z6833 Body mass index (BMI) 33.0-33.9, adult: Secondary | ICD-10-CM | POA: Diagnosis not present

## 2018-03-29 DIAGNOSIS — E669 Obesity, unspecified: Secondary | ICD-10-CM

## 2018-03-29 DIAGNOSIS — Z1211 Encounter for screening for malignant neoplasm of colon: Secondary | ICD-10-CM

## 2018-03-29 DIAGNOSIS — Z1231 Encounter for screening mammogram for malignant neoplasm of breast: Secondary | ICD-10-CM | POA: Diagnosis not present

## 2018-03-29 DIAGNOSIS — I10 Essential (primary) hypertension: Secondary | ICD-10-CM | POA: Insufficient documentation

## 2018-03-29 MED ORDER — TRIAMTERENE-HCTZ 37.5-25 MG PO TABS: 1.0000 | ORAL_TABLET | Freq: Every day | ORAL | 3 refills | Status: DC

## 2018-03-29 MED ORDER — LORATADINE 10 MG PO TABS: 10.0000 mg | ORAL_TABLET | Freq: Every day | ORAL | 5 refills | Status: DC

## 2018-03-29 NOTE — Progress Notes (Signed)
triamterene

## 2018-03-29 NOTE — Patient Instructions (Addendum)
F/ui in  Early December , call if you need me before  Please schedule her October 18 mammogram at checkout  Please take your blood pressure medication  Condolence re your mom's passing  Please work on 10 pound weight loss  Labs today, CBC, lipids, chem 7 and eGFr, TSH, HBA1c and Vit D  You are being referred to Dr Laural Golden for screening colonoscopy, his office will mail you a letter to schedule your appointment  Thank you  for choosing [facility]. We consider it a privelige to serve you.  Delivering excellent health care in a caring and  compassionate way is our goal.  Partnering with you,  so that together we can achieve this goal is our strategy.

## 2018-03-29 NOTE — Progress Notes (Signed)
claritin

## 2018-03-29 NOTE — Telephone Encounter (Signed)
Pt wants to know if she can have a refill on her Maxzide and Claritin ?

## 2018-03-30 LAB — URINE CYTOLOGY ANCILLARY ONLY
CHLAMYDIA, DNA PROBE: NEGATIVE
NEISSERIA GONORRHEA: NEGATIVE
Trichomonas: NEGATIVE

## 2018-03-31 LAB — VITAMIN D 1,25 DIHYDROXY
Vitamin D 1, 25 (OH)2 Total: 70 pg/mL (ref 18–72)
Vitamin D2 1, 25 (OH)2: 14 pg/mL
Vitamin D3 1, 25 (OH)2: 56 pg/mL

## 2018-03-31 LAB — COMPLETE METABOLIC PANEL WITH GFR
AG Ratio: 1.4 (calc) (ref 1.0–2.5)
ALKALINE PHOSPHATASE (APISO): 62 U/L (ref 33–130)
ALT: 11 U/L (ref 6–29)
AST: 19 U/L (ref 10–35)
Albumin: 4.2 g/dL (ref 3.6–5.1)
BILIRUBIN TOTAL: 0.5 mg/dL (ref 0.2–1.2)
BUN: 12 mg/dL (ref 7–25)
CHLORIDE: 106 mmol/L (ref 98–110)
CO2: 30 mmol/L (ref 20–32)
Calcium: 9.5 mg/dL (ref 8.6–10.4)
Creat: 0.94 mg/dL (ref 0.50–1.05)
GFR, Est African American: 80 mL/min/{1.73_m2} (ref >=60)
GFR, Est Non African American: 69 mL/min/{1.73_m2} (ref >=60)
GLUCOSE: 101 mg/dL — AB (ref 65–99)
Globulin: 2.9 g/dL (calc) (ref 1.9–3.7)
Potassium: 4.1 mmol/L (ref 3.5–5.3)
Sodium: 142 mmol/L (ref 135–146)
Total Protein: 7.1 g/dL (ref 6.1–8.1)

## 2018-03-31 LAB — LIPID PANEL
CHOL/HDL RATIO: 3 (calc) (ref <5.0)
Cholesterol: 187 mg/dL (ref <200)
HDL: 63 mg/dL (ref >50)
LDL Cholesterol (Calc): 107 mg/dL (calc) — ABNORMAL HIGH
Non-HDL Cholesterol (Calc): 124 mg/dL (calc) (ref <130)
TRIGLYCERIDES: 84 mg/dL (ref <150)

## 2018-03-31 LAB — HEMOGLOBIN A1C
EAG (MMOL/L): 6.6 (calc)
HEMOGLOBIN A1C: 5.8 %{Hb} — AB (ref <5.7)
MEAN PLASMA GLUCOSE: 120 (calc)

## 2018-03-31 LAB — TSH: TSH: 2.12 m[IU]/L

## 2018-04-02 ENCOUNTER — Encounter: Payer: Self-pay | Admitting: Family Medicine

## 2018-04-02 NOTE — Assessment & Plan Note (Signed)
Uncontrolled due to non compliance. Importance of this is stressed Mdications prescribed DASH diet and commitment to daily physical activity for a minimum of 30 minutes discussed and encouraged, as a part of hypertension management. The importance of attaining a healthy weight is also discussed.  BP/Weight 03/29/2018 08/31/2017 02/17/2017 09/02/2016 01/20/2016 07/22/2015 10/15/69  Systolic BP 219 758 832 549 826 415 830  Diastolic BP 94 82 64 68 84 80 76  Wt. (Lbs) 195 187 188 191.08 184 180 178  BMI 33.47 31.12 31.28 32.29 31.11 31.89 31.54

## 2018-04-02 NOTE — Assessment & Plan Note (Signed)
Deteriorated. Patient re-educated about  the importance of commitment to a  minimum of 150 minutes of exercise per week.  The importance of healthy food choices with portion control discussed. Encouraged to start a food diary, count calories and to consider  joining a support group. Sample diet sheets offered. Goals set by the patient for the next several months.   Weight /BMI 03/29/2018 08/31/2017 02/17/2017  WEIGHT 195 lb 187 lb 188 lb  HEIGHT 5\' 4"  5\' 5"  5\' 5"   BMI 33.47 kg/m2 31.12 kg/m2 31.28 kg/m2

## 2018-04-02 NOTE — Assessment & Plan Note (Signed)

## 2018-04-02 NOTE — Progress Notes (Signed)
Grace Valdez     MRN: [id number]      DOB: 1965/12/30  HPI: Patient is in for annual physical exam. Lost Mother unexpectedly 3 months ago and is getting over this slowly, has th support of her siblings. Wants STD testing  C/o weight gain and knows she needs to address this Recent labs, if available are reviewed. Immunization is reviewed and is up to date  PE: BP (!) 152/94 (BP Location: Right Arm, Patient Position: Sitting)   Pulse 67   Ht 5\' 4"  (1.626 m)   Wt 195 lb (88.5 kg)   LMP 01/24/2013   SpO2 99%   BMI 33.47 kg/m   Pleasant  female, alert and oriented x 3, in no cardio-pulmonary distress. Afebrile. HEENT No facial trauma or asymetry. Sinuses non tender.  Extra occullar muscles intact External ears normal, tympanic membranes clear. Oropharynx moist, no exudate. Neck: supple, no adenopathy,JVD or thyromegaly.No bruits.  Chest: Clear to ascultation bilaterally.No crackles or wheezes. Non tender to palpation  Breast: No asymetry,no masses or lumps. No tenderness. No nipple discharge or inversion. No axillary or supraclavicular adenopathy  Cardiovascular system; Heart sounds normal,  S1 and  S2 ,no S3.  No murmur, or thrill. Apical beat not displaced Peripheral pulses normal.  Abdomen: Soft, non tender, no organomegaly or masses. No bruits. Bowel sounds normal. No guarding, tenderness or rebound. .  GU: Not examined, urine sent for STD testing  Musculoskeletal exam: Full ROM of spine, hips , shoulders and knees. No deformity ,swelling or crepitus noted. No muscle wasting or atrophy.   Neurologic: Cranial nerves 2 to 12 intact. Power, tone ,sensation and reflexes normal throughout. No disturbance in gait. No tremor.  Skin: Intact, no ulceration, erythema , scaling or rash noted. Pigmentation normal throughout  Psych; Mildly depressed mood , at times tearful  And flat  affect. Judgement and concentration normal   Assessment & Plan:  Annual  physical exam Annual exam as documented. Counseling done  re healthy lifestyle involving commitment to 150 minutes exercise per week, heart healthy diet, and attaining healthy weight.The importance of adequate sleep also discussed. Regular seat belt use and home safety, is also discussed. Changes in health habits are decided on by the patient with goals and time frames  set for achieving them. Immunization and cancer screening needs are specifically addressed at this visit.   Essential hypertension Uncontrolled due to non compliance. Importance of this is stressed Mdications prescribed DASH diet and commitment to daily physical activity for a minimum of 30 minutes discussed and encouraged, as a part of hypertension management. The importance of attaining a healthy weight is also discussed.  BP/Weight 03/29/2018 08/31/2017 02/17/2017 09/02/2016 01/20/2016 07/22/2015 5/95/6387  Systolic BP 564 332 951 884 166 063 016  Diastolic BP 94 82 64 68 84 80 76  Wt. (Lbs) 195 187 188 191.08 184 180 178  BMI 33.47 31.12 31.28 32.29 31.11 31.89 31.54       Obesity (BMI 30.0-34.9) Deteriorated. Patient re-educated about  the importance of commitment to a  minimum of 150 minutes of exercise per week.  The importance of healthy food choices with portion control discussed. Encouraged to start a food diary, count calories and to consider  joining a support group. Sample diet sheets offered. Goals set by the patient for the next several months.   Weight /BMI 03/29/2018 08/31/2017 02/17/2017  WEIGHT 195 lb 187 lb 188 lb  HEIGHT 5\' 4"  5\' 5"  5\' 5"   BMI 33.47  kg/m2 31.12 kg/m2 31.28 kg/m2

## 2018-04-04 ENCOUNTER — Encounter (INDEPENDENT_AMBULATORY_CARE_PROVIDER_SITE_OTHER): Payer: Self-pay | Admitting: *Deleted

## 2018-07-04 ENCOUNTER — Ambulatory Visit (HOSPITAL_COMMUNITY)
Admission: RE | Admit: 2018-07-04 | Discharge: 2018-07-04 | Disposition: A | Discharge status: Home / Self Care | Payer: 59 | Source: Ambulatory Visit | Attending: Family Medicine | Admitting: Family Medicine

## 2018-07-04 ENCOUNTER — Encounter (HOSPITAL_COMMUNITY): Payer: Self-pay

## 2018-07-04 DIAGNOSIS — Z1231 Encounter for screening mammogram for malignant neoplasm of breast: Secondary | ICD-10-CM | POA: Diagnosis not present

## 2018-07-21 ENCOUNTER — Ambulatory Visit: Payer: 59 | Admitting: Family Medicine

## 2018-07-21 ENCOUNTER — Encounter: Payer: Self-pay | Admitting: Family Medicine

## 2018-07-21 VITALS — BP 120/72 | HR 72 | Resp 16 | Ht 64.0 in | Wt 193.0 lb

## 2018-07-21 DIAGNOSIS — I10 Essential (primary) hypertension: Secondary | ICD-10-CM | POA: Diagnosis not present

## 2018-07-21 DIAGNOSIS — Z23 Encounter for immunization: Secondary | ICD-10-CM | POA: Insufficient documentation

## 2018-07-21 DIAGNOSIS — R7302 Impaired glucose tolerance (oral): Secondary | ICD-10-CM | POA: Diagnosis not present

## 2018-07-21 DIAGNOSIS — E785 Hyperlipidemia, unspecified: Secondary | ICD-10-CM

## 2018-07-21 DIAGNOSIS — E669 Obesity, unspecified: Secondary | ICD-10-CM | POA: Diagnosis not present

## 2018-07-21 MED ORDER — PHENTERMINE HCL 37.5 MG PO TABS: 37.5000 mg | ORAL_TABLET | Freq: Every day | ORAL | 1 refills | Status: DC

## 2018-07-21 NOTE — Assessment & Plan Note (Signed)
Controlled, no change in medication DASH diet and commitment to daily physical activity for a minimum of 30 minutes discussed and encouraged, as a part of hypertension management. The importance of attaining a healthy weight is also discussed.  BP/Weight 07/21/2018 03/29/2018 08/31/2017 02/17/2017 09/02/2016 01/20/2016 95/11/9670  Systolic BP 897 915 041 364 383 779 396  Diastolic BP 72 94 82 64 68 84 80  Wt. (Lbs) 193 195 187 188 191.08 184 180  BMI 33.13 33.47 31.12 31.28 32.29 31.11 31.89

## 2018-07-21 NOTE — Progress Notes (Signed)
Grace Valdez     MRN: [id number]      DOB: May 06, 1966   HPI Grace Valdez is here for follow up and re-evaluation of chronic medical conditions, medication management and review of any available recent lab and radiology data.  Preventive health is updated, specifically  Cancer screening and Immunization.   . The PT denies any adverse reactions to current medications since the last visit.  Requests appetite suppressant to help with weight loss, has not had this befoe and the adverse s/e and warnings are discussed in detail as well as the need to commit to regular eating with healthy food choice  ROS Denies recent fever or chills. Denies sinus pressure, nasal congestion, ear pain or sore throat. Denies chest congestion, productive cough or wheezing. Denies chest pains, palpitations and leg swelling Denies abdominal pain, nausea, vomiting,diarrhea or constipation.   Denies dysuria, frequency, hesitancy or incontinence. Denies joint pain, swelling and limitation in mobility. Denies headaches, seizures, numbness, or tingling. Denies depression, anxiety or insomnia. Denies skin break down or rash.   PE  BP 120/72   Pulse 72   Resp 16   Ht 5\' 4"  (1.626 m)   Wt 193 lb (87.5 kg)   LMP 01/24/2013   SpO2 98%   BMI 33.13 kg/m   Patient alert and oriented and in no cardiopulmonary distress.  HEENT: No facial asymmetry, EOMI,   oropharynx pink and moist.  Neck supple no JVD, no mass.  Chest: Clear to auscultation bilaterally.  CVS: S1, S2 no murmurs, no S3.Regular rate.  ABD: Soft non tender.   Ext: No edema  MS: Adequate ROM spine, shoulders, hips and knees.  Skin: Intact, no ulcerations or rash noted.  Psych: Good eye contact, normal affect. Memory intact not anxious or depressed appearing.  CNS: CN 2-12 intact, power,  normal throughout.no focal deficits noted.   Assessment & Plan  Essential hypertension Controlled, no change in medication DASH diet and commitment to  daily physical activity for a minimum of 30 minutes discussed and encouraged, as a part of hypertension management. The importance of attaining a healthy weight is also discussed.  BP/Weight 07/21/2018 03/29/2018 08/31/2017 02/17/2017 09/02/2016 01/20/2016 48/10/5001  Systolic BP 704 888 916 945 038 882 800  Diastolic BP 72 94 82 64 68 84 80  Wt. (Lbs) 193 195 187 188 191.08 184 180  BMI 33.13 33.47 31.12 31.28 32.29 31.11 31.89       Obesity (BMI 30.0-34.9) Unchanged, start half phentermine daily Patient re-educated about  the importance of commitment to a  minimum of 150 minutes of exercise per week.  The importance of healthy food choices with portion control discussed. Encouraged to start a food diary, count calories and to consider  joining a support group. Sample diet sheets offered. Goals set by the patient for the next several months.   Weight /BMI 07/21/2018 03/29/2018 08/31/2017  WEIGHT 193 lb 195 lb 187 lb  HEIGHT 5\' 4"  5\' 4"  5\' 5"   BMI 33.13 kg/m2 33.47 kg/m2 31.12 kg/m2      IGT (impaired glucose tolerance) Patient educated about the importance of limiting  Carbohydrate intake , the need to commit to daily physical activity for a minimum of 30 minutes , and to commit weight loss. The fact that changes in all these areas will reduce or eliminate all together the development of diabetes is stressed.   Diabetic Labs Latest Ref Rng & Units 03/29/2018 02/17/2017 09/02/2016 01/10/2016 12/21/2014  HbA1c <5.7 % of total  Hgb 5.8(H) - 5.6 5.8(H) -  Chol <200 mg/dL 187 221(H) 192 231(H) 197  HDL >50 mg/dL 63 71 70 95 68  Calc LDL mg/dL (calc) 107(H) 130(H) 107(H) 122 119(H)  Triglycerides <150 mg/dL 84 99 75 70 48  Creatinine 0.50 - 1.05 mg/dL 0.94 1.00 0.87 0.98 0.92   BP/Weight 07/21/2018 03/29/2018 08/31/2017 02/17/2017 09/02/2016 01/20/2016 53/03/9431  Systolic BP 761 470 929 574 734 037 096  Diastolic BP 72 94 82 64 68 84 80  Wt. (Lbs) 193 195 187 188 191.08 184 180  BMI 33.13 33.47  31.12 31.28 32.29 31.11 31.89   No flowsheet data found.  Updated lab needed at/ before next visit.   Dyslipidemia, goal LDL below 100  Not at goal Hyperlipidemia:Low fat diet discussed and encouraged.   Lipid Panel  Lab Results  Component Value Date   CHOL 187 03/29/2018   HDL 63 03/29/2018   LDLCALC 107 (H) 03/29/2018   TRIG 84 03/29/2018   CHOLHDL 3.0 03/29/2018     Updated lab needed in mid December  Need for immunization against influenza After obtaining informed consent, the vaccine is  administered by LPN.

## 2018-07-21 NOTE — Assessment & Plan Note (Signed)
After obtaining informed consent, the vaccine is  administered by LPN.  

## 2018-07-21 NOTE — Assessment & Plan Note (Signed)
Unchanged, start half phentermine daily Patient re-educated about  the importance of commitment to a  minimum of 150 minutes of exercise per week.  The importance of healthy food choices with portion control discussed. Encouraged to start a food diary, count calories and to consider  joining a support group. Sample diet sheets offered. Goals set by the patient for the next several months.   Weight /BMI 07/21/2018 03/29/2018 08/31/2017  WEIGHT 193 lb 195 lb 187 lb  HEIGHT 5\' 4"  5\' 4"  5\' 5"   BMI 33.13 kg/m2 33.47 kg/m2 31.12 kg/m2

## 2018-07-21 NOTE — Assessment & Plan Note (Signed)
Patient educated about the importance of limiting  Carbohydrate intake , the need to commit to daily physical activity for a minimum of 30 minutes , and to commit weight loss. The fact that changes in all these areas will reduce or eliminate all together the development of diabetes is stressed.   Diabetic Labs Latest Ref Rng & Units 03/29/2018 02/17/2017 09/02/2016 01/10/2016 12/21/2014  HbA1c <5.7 % of total Hgb 5.8(H) - 5.6 5.8(H) -  Chol <200 mg/dL 187 221(H) 192 231(H) 197  HDL >50 mg/dL 63 71 70 95 68  Calc LDL mg/dL (calc) 107(H) 130(H) 107(H) 122 119(H)  Triglycerides <150 mg/dL 84 99 75 70 48  Creatinine 0.50 - 1.05 mg/dL 0.94 1.00 0.87 0.98 0.92   BP/Weight 07/21/2018 03/29/2018 08/31/2017 02/17/2017 09/02/2016 01/20/2016 81/0/1751  Systolic BP 025 852 778 242 353 614 431  Diastolic BP 72 94 82 64 68 84 80  Wt. (Lbs) 193 195 187 188 191.08 184 180  BMI 33.13 33.47 31.12 31.28 32.29 31.11 31.89   No flowsheet data found.  Updated lab needed at/ before next visit.

## 2018-07-21 NOTE — Assessment & Plan Note (Signed)
Not at goal Hyperlipidemia:Low fat diet discussed and encouraged.   Lipid Panel  Lab Results  Component Value Date   CHOL 187 03/29/2018   HDL 63 03/29/2018   LDLCALC 107 (H) 03/29/2018   TRIG 84 03/29/2018   CHOLHDL 3.0 03/29/2018     Updated lab needed in mid December

## 2018-07-21 NOTE — Patient Instructions (Signed)
F/U in 3.5 months, call if you need me sooner  Start HALF phentermine every mornings for help with appetite suppression  It is important that you exercise regularly at least 30 minutes 5 times a week. If you develop chest pain, have severe difficulty breathing, or feel very tired, stop exercising immediately and seek medical attention    Weight loss goal of 4 pounds per month  Water 64 ounces  Stop after dinner  Flu VACCINE absolutely INDICATED!!!

## 2018-08-15 ENCOUNTER — Ambulatory Visit: Payer: 59 | Admitting: Family Medicine

## 2018-08-25 DIAGNOSIS — R7302 Impaired glucose tolerance (oral): Secondary | ICD-10-CM | POA: Diagnosis not present

## 2018-08-25 DIAGNOSIS — I10 Essential (primary) hypertension: Secondary | ICD-10-CM | POA: Diagnosis not present

## 2018-08-25 DIAGNOSIS — E785 Hyperlipidemia, unspecified: Secondary | ICD-10-CM | POA: Diagnosis not present

## 2018-08-25 LAB — BASIC METABOLIC PANEL WITH GFR
BUN/Creatinine Ratio: 18 (calc) (ref 6–22)
BUN: 21 mg/dL (ref 7–25)
CALCIUM: 9.4 mg/dL (ref 8.6–10.4)
CO2: 28 mmol/L (ref 20–32)
CREATININE: 1.14 mg/dL — AB (ref 0.50–1.05)
Chloride: 103 mmol/L (ref 98–110)
GFR, EST AFRICAN AMERICAN: 63 mL/min/{1.73_m2} (ref >=60)
GFR, EST NON AFRICAN AMERICAN: 54 mL/min/{1.73_m2} — AB (ref >=60)
Glucose, Bld: 104 mg/dL — ABNORMAL HIGH (ref 65–99)
POTASSIUM: 3.9 mmol/L (ref 3.5–5.3)
Sodium: 138 mmol/L (ref 135–146)

## 2018-08-25 LAB — LIPID PANEL
CHOL/HDL RATIO: 3.3 (calc) (ref <5.0)
CHOLESTEROL: 216 mg/dL — AB (ref <200)
HDL: 66 mg/dL (ref >50)
LDL Cholesterol (Calc): 134 mg/dL (calc) — ABNORMAL HIGH
Non-HDL Cholesterol (Calc): 150 mg/dL (calc) — ABNORMAL HIGH (ref <130)
TRIGLYCERIDES: 68 mg/dL (ref <150)

## 2018-08-25 LAB — HEMOGLOBIN A1C
EAG (MMOL/L): 6.6 (calc)
HEMOGLOBIN A1C: 5.8 %{Hb} — AB (ref <5.7)
MEAN PLASMA GLUCOSE: 120 (calc)

## 2019-01-30 ENCOUNTER — Ambulatory Visit (INDEPENDENT_AMBULATORY_CARE_PROVIDER_SITE_OTHER): Payer: 59 | Admitting: Family Medicine

## 2019-01-30 ENCOUNTER — Ambulatory Visit: Payer: 59 | Admitting: Family Medicine

## 2019-01-30 ENCOUNTER — Other Ambulatory Visit: Payer: Self-pay

## 2019-01-30 ENCOUNTER — Encounter: Payer: Self-pay | Admitting: Family Medicine

## 2019-01-30 VITALS — BP 118/78 | HR 67 | Resp 12 | Ht 64.0 in | Wt 196.0 lb

## 2019-01-30 DIAGNOSIS — I10 Essential (primary) hypertension: Secondary | ICD-10-CM

## 2019-01-30 DIAGNOSIS — E785 Hyperlipidemia, unspecified: Secondary | ICD-10-CM

## 2019-01-30 DIAGNOSIS — R7302 Impaired glucose tolerance (oral): Secondary | ICD-10-CM

## 2019-01-30 DIAGNOSIS — Z1321 Encounter for screening for nutritional disorder: Secondary | ICD-10-CM

## 2019-01-30 DIAGNOSIS — J302 Other seasonal allergic rhinitis: Secondary | ICD-10-CM

## 2019-01-30 DIAGNOSIS — E669 Obesity, unspecified: Secondary | ICD-10-CM

## 2019-01-30 DIAGNOSIS — Z1231 Encounter for screening mammogram for malignant neoplasm of breast: Secondary | ICD-10-CM | POA: Diagnosis not present

## 2019-01-30 NOTE — Assessment & Plan Note (Signed)
Controlled, no change in medication DASH diet and commitment to daily physical activity for a minimum of 30 minutes discussed and encouraged, as a part of hypertension management. The importance of attaining a healthy weight is also discussed.  BP/Weight 01/30/2019 07/21/2018 03/29/2018 08/31/2017 02/17/2017 87/27/6184 04/19/9275  Systolic BP 394 320 037 944 461 901 222  Diastolic BP 78 72 94 82 64 68 84  Wt. (Lbs) 196 193 195 187 188 191.08 184  BMI 33.64 33.13 33.47 31.12 31.28 32.29 31.11

## 2019-01-30 NOTE — Assessment & Plan Note (Addendum)
Deteriorated.Will try half phentermine daily  Patient re-educated about  the importance of commitment to a  minimum of 150 minutes of exercise per week as able.  The importance of healthy food choices with portion control discussed, as well as eating regularly and within a 12 hour window most days. The need to choose "clean , green" food 50 to 75% of the time is discussed, as well as to make water the primary drink and set a goal of 64 ounces water daily.  Encouraged to start a food diary,  and to consider  joining a support group. Sample diet sheets offered. Goals set by the patient for the next several months.   Weight /BMI 01/30/2019 07/21/2018 03/29/2018  WEIGHT 196 lb 193 lb 195 lb  HEIGHT 5\' 4"  5\' 4"  5\' 4"   BMI 33.64 kg/m2 33.13 kg/m2 33.47 kg/m2

## 2019-01-30 NOTE — Assessment & Plan Note (Signed)
Hyperlipidemia:Low fat diet discussed and encouraged.   Lipid Panel  Lab Results  Component Value Date   CHOL 216 (H) 08/25/2018   HDL 66 08/25/2018   LDLCALC 134 (H) 08/25/2018   TRIG 68 08/25/2018   CHOLHDL 3.3 08/25/2018   Needs to reduce ried and fatty food intake Updated lab needed at/ before next visit.

## 2019-01-30 NOTE — Progress Notes (Signed)
Grace Valdez     MRN: [id number]      DOB: 1965/09/26   HPI Grace Valdez is here for follow up and re-evaluation of chronic medical conditions, medication management and review of any available recent lab and radiology data.  Preventive health is updated, specifically  Cancer screening and Immunization.  Refuses xostavax Questions or concerns regarding consultations or procedures which the PT has had in the interim are  addressed. The PT denies any adverse reactions to current medications since the last visit. Filled but has not taken the phentermine, wil  Try cautiously Reports good exercise commitment, and eats " once/ day"   ROS Denies recent fever or chills. Denies sinus pressure,increased nasal congestion,denies  ear pain or sore throat. Denies chest congestion, productive cough or wheezing. Denies chest pains, palpitations and leg swelling Denies abdominal pain, nausea, vomiting,diarrhea or constipation.   Denies dysuria, frequency, hesitancy or incontinence. Denies joint pain, swelling and limitation in mobility. Denies headaches, seizures, numbness, or tingling. Denies depression, anxiety or insomnia. Denies skin break down or rash.   PE  BP 118/78   Pulse 67   Resp 12   Ht 5\' 4"  (1.626 m)   Wt 196 lb (88.9 kg)   LMP 01/24/2013   SpO2 97%   BMI 33.64 kg/m   Patient alert and oriented and in no cardiopulmonary distress.  HEENT: No facial asymmetry, EOMI,   oropharynx pink and moist.  Neck supple no JVD, no mass.  Chest: Clear to auscultation bilaterally.  CVS: S1, S2 no murmurs, no S3.Regular rate.  ABD: Soft non tender.   Ext: No edema  MS: Adequate ROM spine, shoulders, hips and knees.  Skin: Intact, no ulcerations or rash noted.  Psych: Good eye contact, normal affect. Memory intact not anxious or depressed appearing.  CNS: CN 2-12 intact, power,  normal throughout.no focal deficits noted.   Assessment & Plan  Essential hypertension Controlled, no  change in medication DASH diet and commitment to daily physical activity for a minimum of 30 minutes discussed and encouraged, as a part of hypertension management. The importance of attaining a healthy weight is also discussed.  BP/Weight 01/30/2019 07/21/2018 03/29/2018 08/31/2017 02/17/2017 46/96/2952 04/18/1323  Systolic BP 401 027 253 664 403 474 259  Diastolic BP 78 72 94 82 64 68 84  Wt. (Lbs) 196 193 195 187 188 191.08 184  BMI 33.64 33.13 33.47 31.12 31.28 32.29 31.11       Obesity (BMI 30.0-34.9) Deteriorated.Will try half phentermine daily  Patient re-educated about  the importance of commitment to a  minimum of 150 minutes of exercise per week as able.  The importance of healthy food choices with portion control discussed, as well as eating regularly and within a 12 hour window most days. The need to choose "clean , green" food 50 to 75% of the time is discussed, as well as to make water the primary drink and set a goal of 64 ounces water daily.  Encouraged to start a food diary,  and to consider  joining a support group. Sample diet sheets offered. Goals set by the patient for the next several months.   Weight /BMI 01/30/2019 07/21/2018 03/29/2018  WEIGHT 196 lb 193 lb 195 lb  HEIGHT 5\' 4"  5\' 4"  5\' 4"   BMI 33.64 kg/m2 33.13 kg/m2 33.47 kg/m2      Seasonal allergies Increased symptoms as expected, managed by as needed OTC medication  IGT (impaired glucose tolerance) Patient educated about the importance of  limiting  Carbohydrate intake , the need to commit to daily physical activity for a minimum of 30 minutes , and to commit weight loss. The fact that changes in all these areas will reduce or eliminate all together the development of diabetes is stressed.  Updated lab needed at/ before next visit.   Diabetic Labs Latest Ref Rng & Units 08/25/2018 03/29/2018 02/17/2017 09/02/2016 01/10/2016  HbA1c <5.7 % of total Hgb 5.8(H) 5.8(H) - 5.6 5.8(H)  Chol <200 mg/dL 216(H) 187  221(H) 192 231(H)  HDL >50 mg/dL 66 63 71 70 95  Calc LDL mg/dL (calc) 134(H) 107(H) 130(H) 107(H) 122  Triglycerides <150 mg/dL 68 84 99 75 70  Creatinine 0.50 - 1.05 mg/dL 1.14(H) 0.94 1.00 0.87 0.98   BP/Weight 01/30/2019 07/21/2018 03/29/2018 08/31/2017 02/17/2017 38/32/9191 02/17/599  Systolic BP 459 977 414 239 532 023 343  Diastolic BP 78 72 94 82 64 68 84  Wt. (Lbs) 196 193 195 187 188 191.08 184  BMI 33.64 33.13 33.47 31.12 31.28 32.29 31.11   No flowsheet data found.    Dyslipidemia, goal LDL below 100 Hyperlipidemia:Low fat diet discussed and encouraged.   Lipid Panel  Lab Results  Component Value Date   CHOL 216 (H) 08/25/2018   HDL 66 08/25/2018   LDLCALC 134 (H) 08/25/2018   TRIG 68 08/25/2018   CHOLHDL 3.3 08/25/2018   Needs to reduce ried and fatty food intake Updated lab needed at/ before next visit.

## 2019-01-30 NOTE — Assessment & Plan Note (Signed)
Patient educated about the importance of limiting  Carbohydrate intake , the need to commit to daily physical activity for a minimum of 30 minutes , and to commit weight loss. The fact that changes in all these areas will reduce or eliminate all together the development of diabetes is stressed.  Updated lab needed at/ before next visit.   Diabetic Labs Latest Ref Rng & Units 08/25/2018 03/29/2018 02/17/2017 09/02/2016 01/10/2016  HbA1c <5.7 % of total Hgb 5.8(H) 5.8(H) - 5.6 5.8(H)  Chol <200 mg/dL 216(H) 187 221(H) 192 231(H)  HDL >50 mg/dL 66 63 71 70 95  Calc LDL mg/dL (calc) 134(H) 107(H) 130(H) 107(H) 122  Triglycerides <150 mg/dL 68 84 99 75 70  Creatinine 0.50 - 1.05 mg/dL 1.14(H) 0.94 1.00 0.87 0.98   BP/Weight 01/30/2019 07/21/2018 03/29/2018 08/31/2017 02/17/2017 22/97/9892 09/15/9415  Systolic BP 408 144 818 563 149 702 637  Diastolic BP 78 72 94 82 64 68 84  Wt. (Lbs) 196 193 195 187 188 191.08 184  BMI 33.64 33.13 33.47 31.12 31.28 32.29 31.11   No flowsheet data found.

## 2019-01-30 NOTE — Patient Instructions (Addendum)
Physical exam  w including pelvic exam, no pap, in 4 months, call if you need me before  Labs today, CBC, lipid, cmp  and EGFR, HBA1C, TSH  And vit D  Sample 1500 cal diet sheet provided  If you do take HALF  ( not 1 whole as on bottle) phentermine daily before breakkfast , and you have success , then youi wiill get 1 refill and need to get a 4 month appointment  You will need to weight preferably every week, certainly not less than once per month  Please work on controlling eating habits  Think about what you will eat, plan ahead. Choose " clean, green, fresh or frozen" over canned, processed or packaged foods which are more sugary, salty and fatty. 70 to 75% of food eaten should be vegetables and fruit. Three meals at set times with snacks allowed between meals, but they must be fruit or vegetables. Aim to eat over a 12 hour period , example 7 am to 7 pm, and STOP after  your last meal of the day. Drink water,generally about 64 ounces per day, no other drink is as healthy. Fruit juice is best enjoyed in a healthy way, by EATING the fruit.   It is important that you exercise regularly at least 45 minutes 5 times a week. If you develop chest pain, have severe difficulty breathing, or feel very tired, stop exercising immediately and seek medical attention

## 2019-01-30 NOTE — Assessment & Plan Note (Signed)
Increased symptoms as expected, managed by as needed OTC medication

## 2019-01-31 LAB — COMPLETE METABOLIC PANEL WITH GFR
AG Ratio: 1.6 (calc) (ref 1.0–2.5)
ALT: 10 U/L (ref 6–29)
AST: 17 U/L (ref 10–35)
Albumin: 4.5 g/dL (ref 3.6–5.1)
Alkaline phosphatase (APISO): 52 U/L (ref 37–153)
BUN/Creatinine Ratio: 19 (calc) (ref 6–22)
BUN: 20 mg/dL (ref 7–25)
CO2: 28 mmol/L (ref 20–32)
Calcium: 9.8 mg/dL (ref 8.6–10.4)
Chloride: 103 mmol/L (ref 98–110)
Creat: 1.07 mg/dL — ABNORMAL HIGH (ref 0.50–1.05)
GFR, Est African American: 68 mL/min/{1.73_m2} (ref >=60)
GFR, Est Non African American: 58 mL/min/{1.73_m2} — ABNORMAL LOW (ref >=60)
Globulin: 2.9 g/dL (calc) (ref 1.9–3.7)
Glucose, Bld: 101 mg/dL — ABNORMAL HIGH (ref 65–99)
Potassium: 4 mmol/L (ref 3.5–5.3)
Sodium: 140 mmol/L (ref 135–146)
Total Bilirubin: 0.5 mg/dL (ref 0.2–1.2)
Total Protein: 7.4 g/dL (ref 6.1–8.1)

## 2019-01-31 LAB — LIPID PANEL
Cholesterol: 219 mg/dL — ABNORMAL HIGH (ref <200)
HDL: 64 mg/dL (ref >=50)
LDL Cholesterol (Calc): 134 mg/dL (calc) — ABNORMAL HIGH
Non-HDL Cholesterol (Calc): 155 mg/dL (calc) — ABNORMAL HIGH (ref <130)
Total CHOL/HDL Ratio: 3.4 (calc) (ref <5.0)
Triglycerides: 100 mg/dL (ref <150)

## 2019-01-31 LAB — CBC
HCT: 36.9 % (ref 35.0–45.0)
Hemoglobin: 12.7 g/dL (ref 11.7–15.5)
MCH: 30.8 pg (ref 27.0–33.0)
MCHC: 34.4 g/dL (ref 32.0–36.0)
MCV: 89.3 fL (ref 80.0–100.0)
MPV: 9.9 fL (ref 7.5–12.5)
Platelets: 315 10*3/uL (ref 140–400)
RBC: 4.13 10*6/uL (ref 3.80–5.10)
RDW: 13.5 % (ref 11.0–15.0)
WBC: 7.4 10*3/uL (ref 3.8–10.8)

## 2019-01-31 LAB — VITAMIN D 25 HYDROXY (VIT D DEFICIENCY, FRACTURES): Vit D, 25-Hydroxy: 27 ng/mL — ABNORMAL LOW (ref 30–100)

## 2019-01-31 LAB — HEMOGLOBIN A1C
Hgb A1c MFr Bld: 5.9 % of total Hgb — ABNORMAL HIGH (ref <5.7)
Mean Plasma Glucose: 123 (calc)
eAG (mmol/L): 6.8 (calc)

## 2019-01-31 LAB — TSH: TSH: 2.8 mIU/L

## 2019-05-10 ENCOUNTER — Other Ambulatory Visit: Payer: Self-pay | Admitting: Family Medicine

## 2019-06-05 ENCOUNTER — Encounter: Payer: Self-pay | Admitting: Family Medicine

## 2019-06-05 ENCOUNTER — Other Ambulatory Visit: Payer: Self-pay

## 2019-06-05 ENCOUNTER — Ambulatory Visit (INDEPENDENT_AMBULATORY_CARE_PROVIDER_SITE_OTHER): Payer: 59 | Admitting: Family Medicine

## 2019-06-05 VITALS — BP 118/78 | Ht 64.0 in | Wt 186.0 lb

## 2019-06-05 DIAGNOSIS — Z1321 Encounter for screening for nutritional disorder: Secondary | ICD-10-CM

## 2019-06-05 DIAGNOSIS — R7302 Impaired glucose tolerance (oral): Secondary | ICD-10-CM

## 2019-06-05 DIAGNOSIS — E785 Hyperlipidemia, unspecified: Secondary | ICD-10-CM | POA: Diagnosis not present

## 2019-06-05 DIAGNOSIS — E669 Obesity, unspecified: Secondary | ICD-10-CM

## 2019-06-05 DIAGNOSIS — I10 Essential (primary) hypertension: Secondary | ICD-10-CM | POA: Diagnosis not present

## 2019-06-05 MED ORDER — TRIAMTERENE-HCTZ 37.5-25 MG PO TABS: 1.0000 | ORAL_TABLET | Freq: Every day | ORAL | 1 refills | Status: DC

## 2019-06-05 MED ORDER — LORATADINE 10 MG PO TABS: 10.0000 mg | ORAL_TABLET | Freq: Every day | ORAL | 5 refills | Status: DC

## 2019-06-05 NOTE — Progress Notes (Signed)
Virtual Visit via Telephone Note  I connected with Grace Valdez on 06/05/19 at  9:00 AM EDT by telephone and verified that I am speaking with the correct person using two identifiers.  Location: Patient: home  Provider: office    I discussed the limitations, risks, security and privacy concerns of performing an evaluation and management service by telephone and the availability of in person appointments. I also discussed with the patient that there may be a patient responsible charge related to this service. The patient expressed understanding and agreed to proceed.   History of Present Illness: F/u chronic problems and med review C/o intolerance to phentermine , did not take it for 7 days, has worked on lifestyle change Holding off on flu vaccine currently Denies recent fever or chills. Denies sinus pressure, nasal congestion, ear pain or sore throat. Denies chest congestion, productive cough or wheezing. Denies chest pains, palpitations and leg swelling Denies abdominal pain, nausea, vomiting,diarrhea or constipation.   Denies dysuria, frequency, hesitancy or incontinence. Denies joint pain, swelling and limitation in mobility. Denies headaches, seizures, numbness, or tingling. Denies depression, anxiety or insomnia. Denies skin break down or rash.      Observations/Objective: BP 118/78   Ht 5\' 4"  (1.626 m)   Wt 186 lb (84.4 kg)   LMP 01/24/2013   BMI 31.93 kg/m patient treported Good communication with no confusion and intact memory. Alert and oriented x 3 No signs of respiratory distress during speech     Assessment and Plan: Essential hypertension Controlled, no change in medication DASH diet and commitment to daily physical activity for a minimum of 30 minutes discussed and encouraged, as a part of hypertension management. The importance of attaining a healthy weight is also discussed.  BP/Weight 06/05/2019 01/30/2019 07/21/2018 03/29/2018 08/31/2017 02/17/2017  [id number]  Systolic BP 123456 123456 123456 [id number] 123456 [id number]  Diastolic BP 78 78 72 94 82 64 68  Wt. (Lbs) 186 196 193 195 187 188 191.08  BMI 31.93 33.64 33.13 33.47 31.12 31.28 32.29       Obesity (BMI 30.0-34.9)  Patient re-educated about  the importance of commitment to a  minimum of 150 minutes of exercise per week as able.  The importance of healthy food choices with portion control discussed, as well as eating regularly and within a 12 hour window most days. The need to choose "clean , green" food 50 to 75% of the time is discussed, as well as to make water the primary drink and set a goal of 64 ounces water daily.    Weight /BMI 06/05/2019 01/30/2019 07/21/2018  WEIGHT 186 lb 196 lb 193 lb  HEIGHT 5\' 4"  5\' 4"  5\' 4"   BMI 31.93 kg/m2 33.64 kg/m2 33.13 kg/m2      Dyslipidemia, goal LDL below 100 Hyperlipidemia:Low fat diet discussed and encouraged.   Lipid Panel  Lab Results  Component Value Date   CHOL 219 (H) 01/30/2019   HDL 64 01/30/2019   LDLCALC 134 (H) 01/30/2019   TRIG 100 01/30/2019   CHOLHDL 3.4 01/30/2019   Updated lab needed at/ before next visit.       Follow Up Instructions:    I discussed the assessment and treatment plan with the patient. The patient was provided an opportunity to ask questions and all were answered. The patient agreed with the plan and demonstrated an understanding of the instructions.   The patient was advised to call back or seek an in-person evaluation if the symptoms worsen or if  the condition fails to improve as anticipated.  I provided 22 minutes of non-face-to-face time during this encounter.   Tula Nakayama, MD

## 2019-06-05 NOTE — Patient Instructions (Signed)
Annual exam with pap  first week in March 2021, call if you need me sooner  Please schedule mammogram for patient, morning appt preferred, due 10/22 or after  Fasting cBC, lipid, cmp and eGFr, hBA1C , tSH and vit D mid February  Please schedule your colonoscopy , this is past due  Congrats on change in lifestyle, you report 10 pound weight loss  Remember the importance of flu vaccine  Think about what you will eat, plan ahead. Choose " clean, green, fresh or frozen" over canned, processed or packaged foods which are more sugary, salty and fatty. 70 to 75% of food eaten should be vegetables and fruit. Three meals at set times with snacks allowed between meals, but they must be fruit or vegetables. Aim to eat over a 12 hour period , example 7 am to 7 pm, and STOP after  your last meal of the day. Drink water,generally about 64 ounces per day, no other drink is as healthy. Fruit juice is best enjoyed in a healthy way, by EATING the fruit.  It is important that you exercise regularly at least 30 minutes 5 times a week. If you develop chest pain, have severe difficulty breathing, or feel very tired, stop exercising immediately and seek medical attention

## 2019-06-10 ENCOUNTER — Encounter: Payer: Self-pay | Admitting: Family Medicine

## 2019-06-10 NOTE — Assessment & Plan Note (Signed)
  Patient re-educated about  the importance of commitment to a  minimum of 150 minutes of exercise per week as able.  The importance of healthy food choices with portion control discussed, as well as eating regularly and within a 12 hour window most days. The need to choose "clean , green" food 50 to 75% of the time is discussed, as well as to make water the primary drink and set a goal of 64 ounces water daily.    Weight /BMI 06/05/2019 01/30/2019 07/21/2018  WEIGHT 186 lb 196 lb 193 lb  HEIGHT 5\' 4"  5\' 4"  5\' 4"   BMI 31.93 kg/m2 33.64 kg/m2 33.13 kg/m2

## 2019-06-10 NOTE — Assessment & Plan Note (Signed)
Controlled, no change in medication DASH diet and commitment to daily physical activity for a minimum of 30 minutes discussed and encouraged, as a part of hypertension management. The importance of attaining a healthy weight is also discussed.  BP/Weight 06/05/2019 01/30/2019 07/21/2018 03/29/2018 08/31/2017 02/17/2017 [id number]  Systolic BP 123456 123456 123456 0000000 0000000 123456 [id number]  Diastolic BP 78 78 72 94 82 64 68  Wt. (Lbs) 186 196 193 195 187 188 191.08  BMI 31.93 33.64 33.13 33.47 31.12 31.28 32.29

## 2019-06-10 NOTE — Assessment & Plan Note (Signed)
Hyperlipidemia:Low fat diet discussed and encouraged.   Lipid Panel  Lab Results  Component Value Date   CHOL 219 (H) 01/30/2019   HDL 64 01/30/2019   LDLCALC 134 (H) 01/30/2019   TRIG 100 01/30/2019   CHOLHDL 3.4 01/30/2019   Updated lab needed at/ before next visit.

## 2019-06-15 ENCOUNTER — Ambulatory Visit (INDEPENDENT_AMBULATORY_CARE_PROVIDER_SITE_OTHER): Payer: 59 | Admitting: Family Medicine

## 2019-06-15 ENCOUNTER — Ambulatory Visit (HOSPITAL_COMMUNITY)
Admission: RE | Admit: 2019-06-15 | Discharge: 2019-06-15 | Disposition: A | Discharge status: Home / Self Care | Payer: 59 | Source: Ambulatory Visit | Attending: Family Medicine | Admitting: Family Medicine

## 2019-06-15 ENCOUNTER — Other Ambulatory Visit: Payer: Self-pay

## 2019-06-15 ENCOUNTER — Encounter: Payer: Self-pay | Admitting: Family Medicine

## 2019-06-15 VITALS — BP 110/58 | HR 61 | Temp 98.1°F | Resp 14 | Ht 65.0 in | Wt 190.1 lb

## 2019-06-15 DIAGNOSIS — M25512 Pain in left shoulder: Secondary | ICD-10-CM | POA: Insufficient documentation

## 2019-06-15 DIAGNOSIS — E669 Obesity, unspecified: Secondary | ICD-10-CM | POA: Diagnosis not present

## 2019-06-15 NOTE — Progress Notes (Signed)
Xray of shoulder was negative for arthritis or any other cause for pain. Most likely muscle/ligement related. Do exercises provided.

## 2019-06-15 NOTE — Progress Notes (Addendum)
Subjective:     Patient ID: Grace Valdez, female   DOB: 24-Dec-1965, 53 y.o.   MRN: [id number]  Grace Valdez presents for Arm Pain (left arm)  [name] is a 53 year old female patient who presents today after having left arm pain since Sunday.  Reports is described as aching.  Hurts the most when she is lifting her arm.  Has tried heat and ibuprofen and this is made it much better.  Severity when it is hurting the most is 8 out of 10.  Right now she reports that it is about a 3 out of 10 in the office.  She denies injury, trauma.  Denies that she sleeps on her side.  She reports that she had an issue of this about 2 months ago.  But it went away without having to see the doctor.  She reports that she thinks is arthritis.  But she wanted to make sure that she was treating it appropriately as the pain just was last seen longer than it did when she had a couple months ago.   Today patient denies signs and symptoms of COVID 19 infection including fever, chills, cough, shortness of breath, and headache.  Past Medical, Surgical, Social History, Allergies, and Medications have been Reviewed.  Past Medical History:  Diagnosis Date  . Hypertension    No past surgical history on file. Social History   Socioeconomic History  . Marital status: Legally Separated    Spouse name: Not on file  . Number of children: Not on file  . Years of education: Not on file  . Highest education level: Not on file  Occupational History  . Not on file  Social Needs  . Financial resource strain: Not on file  . Food insecurity    Worry: Not on file    Inability: Not on file  . Transportation needs    Medical: Not on file    Non-medical: Not on file  Tobacco Use  . Smoking status: Former Research scientist (life sciences)  . Smokeless tobacco: Never Used  Substance and Sexual Activity  . Alcohol use: No  . Drug use: No  . Sexual activity: Yes  Lifestyle  . Physical activity    Days per week: Not on file    Minutes per  session: Not on file  . Stress: Not on file  Relationships  . Social Herbalist on phone: Not on file    Gets together: Not on file    Attends religious service: Not on file    Active member of club or organization: Not on file    Attends meetings of clubs or organizations: Not on file    Relationship status: Not on file  . Intimate partner violence    Fear of current or ex partner: Not on file    Emotionally abused: Not on file    Physically abused: Not on file    Forced sexual activity: Not on file  Other Topics Concern  . Not on file  Social History Narrative  . Not on file    Outpatient Encounter Medications as of 06/15/2019  Medication Sig  . Calcium Carb-Cholecalciferol (CALCIUM PLUS VITAMIN D3 PO) Take 1 tablet by mouth daily. Calcium 1200mg   Vit D3 1000units  . loratadine (CLARITIN) 10 MG tablet Take 1 tablet (10 mg total) by mouth daily.  Marland Kitchen triamterene-hydrochlorothiazide (MAXZIDE-25) 37.5-25 MG tablet Take 1 tablet by mouth daily.   No facility-administered encounter medications on file as of  06/15/2019.    Allergies  Allergen Reactions  . Phentermine Nausea And Vomiting    Review of Systems  Constitutional: Negative for chills and fever.  HENT: Negative.   Eyes: Negative.   Respiratory: Negative.  Negative for cough and shortness of breath.   Cardiovascular: Negative.   Gastrointestinal: Negative.   Endocrine: Negative.   Genitourinary: Negative.   Musculoskeletal: Positive for arthralgias.  Skin: Negative.   Allergic/Immunologic: Negative.   Neurological: Negative.   Hematological: Negative.   Psychiatric/Behavioral: Negative.   All other systems reviewed and are negative.      Objective:     BP (!) 110/58   Pulse 61   Temp 98.1 F (36.7 C) (Oral)   Resp 14   Ht 5\' 5"  (1.651 m)   Wt 190 lb 1.9 oz (86.2 kg)   LMP 01/24/2013   SpO2 98%   BMI 31.64 kg/m   Physical Exam Vitals signs and nursing note reviewed.  Constitutional:       Appearance: Normal appearance. She is obese.  HENT:     Head: Normocephalic and atraumatic.     Right Ear: External ear normal.     Left Ear: External ear normal.     Nose: Nose normal.     Mouth/Throat:     Pharynx: Oropharynx is clear.  Eyes:     General:        Right eye: No discharge.        Left eye: No discharge.     Conjunctiva/sclera: Conjunctivae normal.  Neck:     Musculoskeletal: Normal range of motion and neck supple.  Cardiovascular:     Rate and Rhythm: Normal rate and regular rhythm.     Pulses: Normal pulses.     Heart sounds: Normal heart sounds.  Pulmonary:     Effort: Pulmonary effort is normal.     Breath sounds: Normal breath sounds.  Musculoskeletal:     Left shoulder: She exhibits decreased range of motion, tenderness, pain and abnormal pulse. She exhibits no swelling, no crepitus, no deformity, no laceration and normal strength.     Comments: Negative hawkins. Negative empty can/jobe.  Poor Internal rotation  Passive external rotation normal, Direct causes pain.  Skin:    General: Skin is warm.  Neurological:     General: No focal deficit present.     Mental Status: She is alert and oriented to person, place, and time.  Psychiatric:        Mood and Affect: Mood normal.        Behavior: Behavior normal.        Thought Content: Thought content normal.        Judgment: Judgment normal.        Assessment and Plan        1. Acute pain of left shoulder Possible arthritis. RC seems stable, and shoulder ROM is intact. Limited internal rotation, but still able to perform. With improvement from NSAID and heat this is a good sign. Given GFR advised to avoid continuous use of NSAIDS and to try tylenol for now. If that does not work we can use NSAIDS in moderation. Declined injection for pain in office.  Pending findings of xray will recommend ortho vs continued shoulder exercises that were provided today.  - DG Shoulder Left  Append x-ray was  negative for any findings.  Advised to continue physical therapy with exercises provided in office and the use of Tylenol as needed.  2. Obesity (BMI 30.0-34.9) Grace Valdez  Grace Valdez is re-educated about the importance of exercise daily to help with weight management. A minumum of 30 minutes daily is recommended. Additionally, importance of healthy food choices  with portion control discussed.   Wt Readings from Last 3 Encounters:  06/15/19 190 lb 1.9 oz (86.2 kg)  06/05/19 186 lb (84.4 kg)  01/30/19 196 lb (88.9 kg)   Follow Up: PRN   Perlie Mayo, DNP, AGNP-BC [place], [address] [place], [place] [id number] Office Hours: Mon-Thurs 8 am-5 pm; Fri 8 am-12 pm Office Phone:  [phone]  Office Fax: [phone]

## 2019-06-15 NOTE — Patient Instructions (Signed)
I appreciate the opportunity to provide you with the care for your health and wellness. Today we discussed: shoulder pain   Follow up:  11/27/2019  No labs or referrals today  Xray today at [facility] heating pad use as you can and use tylenol mostly for pain and ibuprofen in moderation to protect kidneys  I have attached some exercises for your shoulder stability.   Please continue to practice social distancing to keep you, your family, and our community safe.  If you must go out, please wear a mask and practice good handwashing.  GET FRESH AIR DAILY. STAY HYDRATED WITH WATER.   It was a pleasure to see you and I look forward to continuing to work together on your health and well-being. Please do not hesitate to call the office if you need care or have questions about your care.  Have a wonderful day and week. With Gratitude, Cherly Beach, DNP, AGNP-BC   Shoulder Exercises Ask your health care provider which exercises are safe for you. Do exercises exactly as told by your health care provider and adjust them as directed. It is normal to feel mild stretching, pulling, tightness, or discomfort as you do these exercises. Stop right away if you feel sudden pain or your pain gets worse. Do not begin these exercises until told by your health care provider. Stretching exercises External rotation and abduction This exercise is sometimes called corner stretch. This exercise rotates your arm outward (external rotation) and moves your arm out from your body (abduction). 1. Stand in a doorway with one of your feet slightly in front of the other. This is called a staggered stance. If you cannot reach your forearms to the door frame, stand facing a corner of a room. 2. Choose one of the following positions as told by your health care provider: ? Place your hands and forearms on the door frame above your head. ? Place your hands and forearms on the door frame at the height of your  head. ? Place your hands on the door frame at the height of your elbows. 3. Slowly move your weight onto your front foot until you feel a stretch across your chest and in the front of your shoulders. Keep your head and chest upright and keep your abdominal muscles tight. 4. Hold for __10-20________ seconds. 5. To release the stretch, shift your weight to your back foot. Repeat ___3_______ times. Complete this exercise ___2_______ times a day. Extension, standing 1. Stand and hold a broomstick, a cane, or a similar object behind your back. ? Your hands should be a little wider than shoulder width apart. ? Your palms should face away from your back. 2. Keeping your elbows straight and your shoulder muscles relaxed, move the stick away from your body until you feel a stretch in your shoulders (extension). ? Avoid shrugging your shoulders while you move the stick. Keep your shoulder blades tucked down toward the middle of your back. 3. Hold for ___10-20_______ seconds. 4. Slowly return to the starting position. Repeat __3________ times. Complete this exercise ____2______ times a day. Range-of-motion exercises Pendulum  1. Stand near a wall or a surface that you can hold onto for balance. 2. Bend at the waist and let your left / right arm hang straight down. Use your other arm to support you. Keep your back straight and do not lock your knees. 3. Relax your left / right arm and shoulder muscles, and move your hips and  your trunk so your left / right arm swings freely. Your arm should swing because of the motion of your body, not because you are using your arm or shoulder muscles. 4. Keep moving your hips and trunk so your arm swings in the following directions, as told by your health care provider: ? Side to side. ? Forward and backward. ? In clockwise and counterclockwise circles. 5. Continue each motion for _10-20_________ seconds, or for as [place] as told by your health care provider. 6. Slowly  return to the starting position. Repeat ___3_______ times. Complete this exercise ____2______ times a day.  Internal rotation  1. Place your left / right hand behind your back, palm up. 2. Use your other hand to dangle an exercise band, a towel, or a similar object over your shoulder. Grasp the band with your left / right hand so you are holding on to both ends. 3. Gently pull up on the band until you feel a stretch in the front of your left / right shoulder. The movement of your arm toward the center of your body is called internal rotation. ? Avoid shrugging your shoulder while you raise your arm. Keep your shoulder blade tucked down toward the middle of your back. 4. Hold for __5-_10_______ seconds. 5. Release the stretch by letting go of the band and lowering your hands. Repeat ______3____ times. Complete this exercise ______2____ times a day. Strengthening exercises External rotation  1. Sit in a stable chair without armrests. 2. Secure an exercise band to a stable object at elbow height on your left / right side. 3. Place a soft object, such as a folded towel or a small pillow, between your left / right upper arm and your body to move your elbow about 4 inches (10 cm) away from your side. 4. Hold the end of the exercise band so it is tight and there is no slack. 5. Keeping your elbow pressed against the soft object, slowly move your forearm out, away from your abdomen (external rotation). Keep your body steady so only your forearm moves. 6. Hold for __5-10________ seconds. 7. Slowly return to the starting position. Repeat ____3______ times. Complete this exercise _____2_____ times a day. Shoulder abduction  1. Sit in a stable chair without armrests, or stand up. 2. Hold a ____10______ weight in your left / right hand, or hold an exercise band with both hands. 3. Start with your arms straight down and your left / right palm facing in, toward your body. 4. Slowly lift your left /  right hand out to your side (abduction). Do not lift your hand above shoulder height unless your health care provider tells you that this is safe. ? Keep your arms straight. ? Avoid shrugging your shoulder while you do this movement. Keep your shoulder blade tucked down toward the middle of your back. 5. Hold for ___10-20_______ seconds. 6. Slowly lower your arm, and return to the starting position. Repeat ___3_______ times. Complete this exercise ____2______ times a day. Shoulder extension 1. Sit in a stable chair without armrests, or stand up. 2. Secure an exercise band to a stable object in front of you so it is at shoulder height. 3. Hold one end of the exercise band in each hand. Your palms should face each other. 4. Straighten your elbows and lift your hands up to shoulder height. 5. Step back, away from the secured end of the exercise band, until the band is tight and there is no slack. 6. Squeeze  your shoulder blades together as you pull your hands down to the sides of your thighs (extension). Stop when your hands are straight down by your sides. Do not let your hands go behind your body. 7. Hold for _10_________ seconds. 8. Slowly return to the starting position. Repeat ____3______ times. Complete this exercise ____2______ times a day. Shoulder row 1. Sit in a stable chair without armrests, or stand up. 2. Secure an exercise band to a stable object in front of you so it is at waist height. 3. Hold one end of the exercise band in each hand. Position your palms so that your thumbs are facing the ceiling (neutral position). 4. Bend each of your elbows to a 90-degree angle (right angle) and keep your upper arms at your sides. 5. Step back until the band is tight and there is no slack. 6. Slowly pull your elbows back behind you. 7. Hold for ___10_______ seconds. 8. Slowly return to the starting position. Repeat _____3_____ times. Complete this exercise _____2_____ times a day. Shoulder  press-ups  1. Sit in a stable chair that has armrests. Sit upright, with your feet flat on the floor. 2. Put your hands on the armrests so your elbows are bent and your fingers are pointing forward. Your hands should be about even with the sides of your body. 3. Push down on the armrests and use your arms to lift yourself off the chair. Straighten your elbows and lift yourself up as much as you comfortably can. ? Move your shoulder blades down, and avoid letting your shoulders move up toward your ears. ? Keep your feet on the ground. As you get stronger, your feet should support less of your body weight as you lift yourself up. 4. Hold for _10-20_________ seconds. 5. Slowly lower yourself back into the chair. Repeat ___3_______ times. Complete this exercise ____2______ times a day. Wall push-ups  1. Stand so you are facing a stable wall. Your feet should be about one arm-length away from the wall. 2. Lean forward and place your palms on the wall at shoulder height. 3. Keep your feet flat on the floor as you bend your elbows and lean forward toward the wall. 4. Hold for ___10-20_______ seconds. 5. Straighten your elbows to push yourself back to the starting position. Repeat _____3_____ times. Complete this exercise _____2_____ times a day. This information is not intended to replace advice given to you by your health care provider. Make sure you discuss any questions you have with your health care provider. Document Released: 07/15/2005 Document Revised: 12/23/2018 Document Reviewed: 09/30/2018 Elsevier Patient Education  2020 [employer].

## 2019-07-07 ENCOUNTER — Ambulatory Visit (HOSPITAL_COMMUNITY)
Admission: RE | Admit: 2019-07-07 | Discharge: 2019-07-07 | Disposition: A | Discharge status: Home / Self Care | Payer: 59 | Source: Ambulatory Visit | Attending: Family Medicine | Admitting: Family Medicine

## 2019-07-07 ENCOUNTER — Other Ambulatory Visit: Payer: Self-pay

## 2019-07-07 ENCOUNTER — Ambulatory Visit (HOSPITAL_COMMUNITY): Payer: 59

## 2019-07-07 DIAGNOSIS — Z1231 Encounter for screening mammogram for malignant neoplasm of breast: Secondary | ICD-10-CM | POA: Diagnosis not present

## 2019-10-03 ENCOUNTER — Telehealth: Payer: Self-pay | Admitting: *Deleted

## 2019-10-03 NOTE — Telephone Encounter (Signed)
Pt called she got tested for covid on 09-21-19 and it resulted on the same day for positive. Her symptoms have gone away and she fills fine but she was wondering if she needed to go be retested and when should she go get retested.

## 2019-10-03 NOTE — Telephone Encounter (Signed)
Spoke with patient and advised her she does not need to be retested with verbal understanding

## 2019-11-07 LAB — CBC
HCT: 34.2 % — ABNORMAL LOW (ref 35.0–45.0)
Hemoglobin: 11.6 g/dL — ABNORMAL LOW (ref 11.7–15.5)
MCH: 30.7 pg (ref 27.0–33.0)
MCHC: 33.9 g/dL (ref 32.0–36.0)
MCV: 90.5 fL (ref 80.0–100.0)
MPV: 9.4 fL (ref 7.5–12.5)
Platelets: 384 10*3/uL (ref 140–400)
RBC: 3.78 10*6/uL — ABNORMAL LOW (ref 3.80–5.10)
RDW: 13.3 % (ref 11.0–15.0)
WBC: 6.6 10*3/uL (ref 3.8–10.8)

## 2019-11-07 LAB — COMPLETE METABOLIC PANEL WITH GFR
AG Ratio: 1.5 (calc) (ref 1.0–2.5)
ALT: 11 U/L (ref 6–29)
AST: 15 U/L (ref 10–35)
Albumin: 3.9 g/dL (ref 3.6–5.1)
Alkaline phosphatase (APISO): 49 U/L (ref 37–153)
BUN: 23 mg/dL (ref 7–25)
CO2: 28 mmol/L (ref 20–32)
Calcium: 9 mg/dL (ref 8.6–10.4)
Chloride: 109 mmol/L (ref 98–110)
Creat: 0.95 mg/dL (ref 0.50–1.05)
GFR, Est African American: 78 mL/min/{1.73_m2} (ref >=60)
GFR, Est Non African American: 67 mL/min/{1.73_m2} (ref >=60)
Globulin: 2.6 g/dL (calc) (ref 1.9–3.7)
Glucose, Bld: 108 mg/dL — ABNORMAL HIGH (ref 65–99)
Potassium: 4.3 mmol/L (ref 3.5–5.3)
Sodium: 142 mmol/L (ref 135–146)
Total Bilirubin: 0.5 mg/dL (ref 0.2–1.2)
Total Protein: 6.5 g/dL (ref 6.1–8.1)

## 2019-11-07 LAB — HEMOGLOBIN A1C
Hgb A1c MFr Bld: 6 % of total Hgb — ABNORMAL HIGH (ref <5.7)
Mean Plasma Glucose: 126 (calc)
eAG (mmol/L): 7 (calc)

## 2019-11-07 LAB — LIPID PANEL
Cholesterol: 189 mg/dL (ref <200)
HDL: 62 mg/dL (ref >=50)
LDL Cholesterol (Calc): 111 mg/dL (calc) — ABNORMAL HIGH
Non-HDL Cholesterol (Calc): 127 mg/dL (calc) (ref <130)
Total CHOL/HDL Ratio: 3 (calc) (ref <5.0)
Triglycerides: 74 mg/dL (ref <150)

## 2019-11-07 LAB — VITAMIN D 25 HYDROXY (VIT D DEFICIENCY, FRACTURES): Vit D, 25-Hydroxy: 19 ng/mL — ABNORMAL LOW (ref 30–100)

## 2019-11-07 LAB — TSH: TSH: 2.04 mIU/L

## 2019-11-27 ENCOUNTER — Encounter: Payer: 59 | Admitting: Family Medicine

## 2020-01-17 ENCOUNTER — Ambulatory Visit: Payer: 59 | Attending: Internal Medicine

## 2020-01-17 DIAGNOSIS — Z23 Encounter for immunization: Secondary | ICD-10-CM

## 2020-01-17 NOTE — Progress Notes (Signed)
   Covid-19 Vaccination Clinic  Name:  Grace Valdez    MRN: [id number] DOB: 1965-12-08  01/17/2020  [name] was observed post Covid-19 immunization for 15 minutes without incident. She was provided with Vaccine Information Sheet and instruction to access the V-Safe system.   [name] was instructed to call 911 with any severe reactions post vaccine: Marland Kitchen Difficulty breathing  . Swelling of face and throat  . A fast heartbeat  . A bad rash all over body  . Dizziness and weakness   Immunizations Administered    Name Date Dose VIS Date Route   Moderna COVID-19 Vaccine 01/17/2020  9:50 AM 0.5 mL 08/2019 Intramuscular   Manufacturer: Moderna   Lot: [id number]   [id number]

## 2020-02-15 ENCOUNTER — Ambulatory Visit: Payer: 59 | Attending: Internal Medicine

## 2020-02-15 DIAGNOSIS — Z23 Encounter for immunization: Secondary | ICD-10-CM

## 2020-02-15 NOTE — Progress Notes (Signed)
   Covid-19 Vaccination Clinic  Name:  Grace Valdez    MRN: [id number] DOB: December 26, 1965  02/15/2020  [name] was observed post Covid-19 immunization for 15 minutes without incident. She was provided with Vaccine Information Sheet and instruction to access the V-Safe system.   [name] was instructed to call 911 with any severe reactions post vaccine: Marland Kitchen Difficulty breathing  . Swelling of face and throat  . A fast heartbeat  . A bad rash all over body  . Dizziness and weakness   Immunizations Administered    Name Date Dose VIS Date Route   Moderna COVID-19 Vaccine 02/15/2020  9:20 AM 0.5 mL 08/2019 Intramuscular   Manufacturer: Moderna   Lot: [id number]   [id number]

## 2020-04-22 ENCOUNTER — Other Ambulatory Visit: Payer: Self-pay | Admitting: Family Medicine

## 2020-05-08 ENCOUNTER — Other Ambulatory Visit: Payer: Self-pay

## 2020-05-08 ENCOUNTER — Other Ambulatory Visit (HOSPITAL_COMMUNITY)
Admission: RE | Admit: 2020-05-08 | Discharge: 2020-05-08 | Disposition: A | Discharge status: Home / Self Care | Payer: 59 | Source: Ambulatory Visit | Attending: Family Medicine | Admitting: Family Medicine

## 2020-05-08 ENCOUNTER — Encounter: Payer: Self-pay | Admitting: Family Medicine

## 2020-05-08 ENCOUNTER — Ambulatory Visit (INDEPENDENT_AMBULATORY_CARE_PROVIDER_SITE_OTHER): Payer: 59 | Admitting: Family Medicine

## 2020-05-08 VITALS — BP 113/70 | HR 64 | Resp 16 | Ht 65.0 in | Wt 196.0 lb

## 2020-05-08 DIAGNOSIS — Z124 Encounter for screening for malignant neoplasm of cervix: Secondary | ICD-10-CM

## 2020-05-08 DIAGNOSIS — R7302 Impaired glucose tolerance (oral): Secondary | ICD-10-CM

## 2020-05-08 DIAGNOSIS — Z1231 Encounter for screening mammogram for malignant neoplasm of breast: Secondary | ICD-10-CM | POA: Diagnosis not present

## 2020-05-08 DIAGNOSIS — Z0000 Encounter for general adult medical examination without abnormal findings: Secondary | ICD-10-CM | POA: Diagnosis not present

## 2020-05-08 DIAGNOSIS — Z1321 Encounter for screening for nutritional disorder: Secondary | ICD-10-CM

## 2020-05-08 DIAGNOSIS — E785 Hyperlipidemia, unspecified: Secondary | ICD-10-CM

## 2020-05-08 DIAGNOSIS — I10 Essential (primary) hypertension: Secondary | ICD-10-CM

## 2020-05-08 DIAGNOSIS — B351 Tinea unguium: Secondary | ICD-10-CM

## 2020-05-08 DIAGNOSIS — Z1211 Encounter for screening for malignant neoplasm of colon: Secondary | ICD-10-CM

## 2020-05-08 NOTE — Assessment & Plan Note (Signed)
Patient educated about the importance of limiting  Carbohydrate intake , the need to commit to daily physical activity for a minimum of 30 minutes , and to commit weight loss. The fact that changes in all these areas will reduce or eliminate all together the development of diabetes is stressed.   Diabetic Labs Latest Ref Rng & Units 11/06/2019 01/30/2019 08/25/2018 03/29/2018 02/17/2017  HbA1c <5.7 % of total Hgb 6.0(H) 5.9(H) 5.8(H) 5.8(H) -  Chol <200 mg/dL 189 219(H) 216(H) 187 221(H)  HDL > OR = 50 mg/dL 62 64 66 63 71  Calc LDL mg/dL (calc) 111(H) 134(H) 134(H) 107(H) 130(H)  Triglycerides <150 mg/dL 74 100 68 84 99  Creatinine 0.50 - 1.05 mg/dL 0.95 1.07(H) 1.14(H) 0.94 1.00   BP/Weight 05/08/2020 06/15/2019 06/05/2019 01/30/2019 07/21/2018 03/29/2018 57/97/2820  Systolic BP 601 561 537 943 276 147 092  Diastolic BP 70 58 78 78 72 94 82  Wt. (Lbs) 196 190.12 186 196 193 195 187  BMI 32.62 31.64 31.93 33.64 33.13 33.47 31.12   No flowsheet data found.  Updated lab needed at/ before next visit.

## 2020-05-08 NOTE — Patient Instructions (Addendum)
F/U in 6 months, call if you need me before  Mammogram due in October to be scheduled at [facility]    CBC, fasting lipid, cmp and eGFR, tSH  And vit D 1 in 5.5 months GlycoHb today  Coongrats on improved cholesterol  You are referred for colonoscopy which you need, and also to dermatology re loss of toenails  It is important that you exercise regularly at least 30 minutes 5 times a week. If you develop chest pain, have severe difficulty breathing, or feel very tired, stop exercising immediately and seek medical attention  Think about what you will eat, plan ahead. Choose " clean, green, fresh or frozen" over canned, processed or packaged foods which are more sugary, salty and fatty. 70 to 75% of food eaten should be vegetables and fruit. Three meals at set times with snacks allowed between meals, but they must be fruit or vegetables. Aim to eat over a 12 hour period , example 7 am to 7 pm, and STOP after  your last meal of the day. Drink water,generally about 64 ounces per day, no other drink is as healthy. Fruit juice is best enjoyed in a healthy way, by EATING the fruit. Thanks for choosing [facility], we consider it a privelige to serve you.

## 2020-05-08 NOTE — Assessment & Plan Note (Signed)
Thickened left 5th toenail and also of great toe nail, likely fungal, however not classic in presentation, refer dermatology

## 2020-05-08 NOTE — Progress Notes (Signed)
Grace Valdez     MRN: [id number]      DOB: 09/01/1966  HPI: Patient is in for annual physical exam. C/o loss of toenails  Esp little toe on right and great toe on left , with lifting off from nailbed of the  nails Recent labs, if available are reviewed. Immunization is reviewed , and  updated if needed.   PE: BP 113/70   Pulse 64   Resp 16   Ht 5\' 5"  (1.651 m)   Wt 196 lb (88.9 kg)   LMP 01/24/2013   SpO2 98%   BMI 32.62 kg/m   Pleasant  female, alert and oriented x 3, in no cardio-pulmonary distress. Afebrile. HEENT No facial trauma or asymetry. Sinuses non tender.  Extra occullar muscles intact.. External ears normal, . Neck: supple, no adenopathy,JVD or thyromegaly.No bruits.  Chest: Clear to ascultation bilaterally.No crackles or wheezes. Non tender to palpation  Breast: No asymetry,no masses or lumps. No tenderness. No nipple discharge or inversion. No axillary or supraclavicular adenopathy  Cardiovascular system; Heart sounds normal,  S1 and  S2 ,no S3.  No murmur, or thrill. Apical beat not displaced Peripheral pulses normal.  Abdomen: Soft, non tender, no organomegaly or masses. No bruits. Bowel sounds normal. No guarding, tenderness or rebound.   GU: External genitalia normal female genitalia , normal female distribution of hair. No lesions. Urethral meatus normal in size, no  Prolapse, no lesions visibly  Present. Bladder non tender. Vagina pink and moist , with no visible lesions , discharge present . Adequate pelvic support no  cystocele or rectocele noted Cervix pink and appears healthy, no lesions or ulcerations noted, no discharge noted from os Uterus normal size, no adnexal masses, no cervical motion or adnexal tenderness.   Musculoskeletal exam: Full ROM of spine, hips , shoulders and knees. No deformity ,swelling or crepitus noted. No muscle wasting or atrophy.   Neurologic: Cranial nerves 2 to 12 intact. Power, tone ,sensation  and reflexes normal throughout. No disturbance in gait. No tremor.  Skin: Intact, no ulceration, erythema , scaling or rash noted. Pigmentation normal throughout  Psych; Normal mood and affect. Judgement and concentration normal   Assessment & Plan:  Annual physical exam Annual exam as documented. Counseling done  re healthy lifestyle involving commitment to 150 minutes exercise per week, heart healthy diet, and attaining healthy weight.The importance of adequate sleep also discussed. Regular seat belt use and home safety, is also discussed. Changes in health habits are decided on by the patient with goals and time frames  set for achieving them. Immunization and cancer screening needs are specifically addressed at this visit.   IGT (impaired glucose tolerance) Patient educated about the importance of limiting  Carbohydrate intake , the need to commit to daily physical activity for a minimum of 30 minutes , and to commit weight loss. The fact that changes in all these areas will reduce or eliminate all together the development of diabetes is stressed.   Diabetic Labs Latest Ref Rng & Units 11/06/2019 01/30/2019 08/25/2018 03/29/2018 02/17/2017  HbA1c <5.7 % of total Hgb 6.0(H) 5.9(H) 5.8(H) 5.8(H) -  Chol <200 mg/dL 189 219(H) 216(H) 187 221(H)  HDL > OR = 50 mg/dL 62 64 66 63 71  Calc LDL mg/dL (calc) 111(H) 134(H) 134(H) 107(H) 130(H)  Triglycerides <150 mg/dL 74 100 68 84 99  Creatinine 0.50 - 1.05 mg/dL 0.95 1.07(H) 1.14(H) 0.94 1.00   BP/Weight 05/08/2020 06/15/2019 06/05/2019 01/30/2019 07/21/2018 03/29/2018 08/31/2017  Systolic BP 795 583 167 425 525 894 834  Diastolic BP 70 58 78 78 72 94 82  Wt. (Lbs) 196 190.12 186 196 193 195 187  BMI 32.62 31.64 31.93 33.64 33.13 33.47 31.12   No flowsheet data found.  Updated lab needed at/ before next visit.

## 2020-05-08 NOTE — Assessment & Plan Note (Signed)

## 2020-05-09 ENCOUNTER — Encounter (INDEPENDENT_AMBULATORY_CARE_PROVIDER_SITE_OTHER): Payer: Self-pay | Admitting: *Deleted

## 2020-05-09 LAB — CYTOLOGY - PAP
Comment: NEGATIVE
Diagnosis: NEGATIVE
High risk HPV: NEGATIVE

## 2020-05-09 LAB — POCT GLYCOSYLATED HEMOGLOBIN (HGB A1C): Hemoglobin A1C: 5.9 % — AB (ref 4.0–5.6)

## 2020-05-09 NOTE — Addendum Note (Signed)
Addended by: Obie Dredge A on: 05/09/2020 09:49 AM   Modules accepted: Orders

## 2020-07-12 ENCOUNTER — Other Ambulatory Visit (INDEPENDENT_AMBULATORY_CARE_PROVIDER_SITE_OTHER): Payer: Self-pay

## 2020-07-12 DIAGNOSIS — Z1211 Encounter for screening for malignant neoplasm of colon: Secondary | ICD-10-CM

## 2020-07-22 ENCOUNTER — Other Ambulatory Visit (INDEPENDENT_AMBULATORY_CARE_PROVIDER_SITE_OTHER): Payer: Self-pay

## 2020-07-22 ENCOUNTER — Telehealth (INDEPENDENT_AMBULATORY_CARE_PROVIDER_SITE_OTHER): Payer: Self-pay

## 2020-07-22 ENCOUNTER — Encounter (INDEPENDENT_AMBULATORY_CARE_PROVIDER_SITE_OTHER): Payer: Self-pay

## 2020-07-22 MED ORDER — PLENVU 140 G PO SOLR: 1.0000 | Freq: Once | ORAL | 0 refills | Status: AC

## 2020-07-22 NOTE — Telephone Encounter (Signed)
Ok to schedule.  Thanks,  Teairra Millar Castaneda Mayorga, MD Gastroenterology and Hepatology [facility]  

## 2020-07-22 NOTE — Telephone Encounter (Signed)
Patient has Plenvu (copay card)

## 2020-07-22 NOTE — Telephone Encounter (Signed)
Referring MD/PCP: Moshe Cipro   Procedure: TCS  Reason/Indication:  Screening  Has patient had this procedure before?  no  If so, when, by whom and where?    Is there a family history of colon cancer?  no  Who?  What age when diagnosed?    Is patient diabetic?   no      Does patient have prosthetic heart valve or mechanical valve?  no  Do you have a pacemaker/defibrillator?  no  Has patient ever had endocarditis/atrial fibrillation? no  Does patient use oxygen? no  Has patient had joint replacement within last 12 months?  no  Is patient constipated or do they take laxatives? no  Does patient have a history of alcohol/drug use?  no  Is patient on blood thinner such as Coumadin, Plavix and/or Aspirin? no  Medications: Loratadine 10mg  daily prn, Triam/HCTZ 37.5/25mg  daily  Allergies: nkda  Medication Adjustment per Dr Jenetta Downer none  Procedure date & time: 08/20/20 7:30

## 2020-08-16 ENCOUNTER — Other Ambulatory Visit (INDEPENDENT_AMBULATORY_CARE_PROVIDER_SITE_OTHER): Payer: Self-pay

## 2020-08-16 ENCOUNTER — Encounter (INDEPENDENT_AMBULATORY_CARE_PROVIDER_SITE_OTHER): Payer: Self-pay

## 2020-08-19 ENCOUNTER — Other Ambulatory Visit (HOSPITAL_COMMUNITY)
Admission: RE | Admit: 2020-08-19 | Discharge: 2020-08-19 | Disposition: A | Discharge status: Home / Self Care | Payer: 59 | Source: Ambulatory Visit | Attending: Gastroenterology | Admitting: Gastroenterology

## 2020-08-19 ENCOUNTER — Other Ambulatory Visit (INDEPENDENT_AMBULATORY_CARE_PROVIDER_SITE_OTHER): Payer: Self-pay

## 2020-08-26 ENCOUNTER — Other Ambulatory Visit: Payer: Self-pay | Admitting: Family Medicine

## 2020-09-24 ENCOUNTER — Encounter (HOSPITAL_COMMUNITY): Payer: Self-pay | Admitting: Anesthesiology

## 2020-09-24 ENCOUNTER — Other Ambulatory Visit (HOSPITAL_COMMUNITY)
Admission: RE | Admit: 2020-09-24 | Discharge: 2020-09-24 | Disposition: A | Discharge status: Home / Self Care | Payer: 59 | Source: Ambulatory Visit | Attending: Gastroenterology | Admitting: Gastroenterology

## 2020-09-24 ENCOUNTER — Other Ambulatory Visit: Payer: Self-pay

## 2020-09-24 DIAGNOSIS — U071 COVID-19: Secondary | ICD-10-CM | POA: Diagnosis not present

## 2020-09-24 DIAGNOSIS — Z01812 Encounter for preprocedural laboratory examination: Secondary | ICD-10-CM | POA: Insufficient documentation

## 2020-09-24 LAB — BASIC METABOLIC PANEL
Anion gap: 8 (ref 5–15)
BUN: 16 mg/dL (ref 6–20)
CO2: 26 mmol/L (ref 22–32)
Calcium: 9 mg/dL (ref 8.9–10.3)
Chloride: 104 mmol/L (ref 98–111)
Creatinine, Ser: 1.03 mg/dL — ABNORMAL HIGH (ref 0.44–1.00)
GFR, Estimated: >60 mL/min (ref >60)
Glucose, Bld: 109 mg/dL — ABNORMAL HIGH (ref 70–99)
Potassium: 3.4 mmol/L — ABNORMAL LOW (ref 3.5–5.1)
Sodium: 138 mmol/L (ref 135–145)

## 2020-09-24 LAB — SARS CORONAVIRUS 2 (TAT 6-24 HRS): SARS Coronavirus 2: POSITIVE — AB

## 2020-09-25 ENCOUNTER — Ambulatory Visit (HOSPITAL_COMMUNITY): Admission: RE | Admit: 2020-09-25 | Payer: 59 | Source: Home / Self Care | Admitting: Gastroenterology

## 2020-09-25 SURGERY — COLONOSCOPY WITH PROPOFOL
Anesthesia: Monitor Anesthesia Care

## 2020-10-08 ENCOUNTER — Other Ambulatory Visit (INDEPENDENT_AMBULATORY_CARE_PROVIDER_SITE_OTHER): Payer: Self-pay

## 2020-10-08 ENCOUNTER — Encounter (INDEPENDENT_AMBULATORY_CARE_PROVIDER_SITE_OTHER): Payer: Self-pay

## 2020-11-05 ENCOUNTER — Encounter (INDEPENDENT_AMBULATORY_CARE_PROVIDER_SITE_OTHER): Payer: Self-pay

## 2020-11-06 LAB — LIPID PANEL
Chol/HDL Ratio: 3.1 ratio (ref 0.0–4.4)
Cholesterol, Total: 223 mg/dL — ABNORMAL HIGH (ref 100–199)
HDL: 72 mg/dL (ref >39)
LDL Chol Calc (NIH): 138 mg/dL — ABNORMAL HIGH (ref 0–99)
Triglycerides: 75 mg/dL (ref 0–149)
VLDL Cholesterol Cal: 13 mg/dL (ref 5–40)

## 2020-11-06 LAB — CMP14+EGFR
ALT: 11 IU/L (ref 0–32)
AST: 17 IU/L (ref 0–40)
Albumin/Globulin Ratio: 1.8 (ref 1.2–2.2)
Albumin: 4.6 g/dL (ref 3.8–4.9)
Alkaline Phosphatase: 71 IU/L (ref 44–121)
BUN/Creatinine Ratio: 13 (ref 9–23)
BUN: 14 mg/dL (ref 6–24)
Bilirubin Total: 0.2 mg/dL (ref 0.0–1.2)
CO2: 23 mmol/L (ref 20–29)
Calcium: 9.6 mg/dL (ref 8.7–10.2)
Chloride: 100 mmol/L (ref 96–106)
Creatinine, Ser: 1.07 mg/dL — ABNORMAL HIGH (ref 0.57–1.00)
GFR calc Af Amer: 67 mL/min/{1.73_m2} (ref >59)
GFR calc non Af Amer: 58 mL/min/{1.73_m2} — ABNORMAL LOW (ref >59)
Globulin, Total: 2.6 g/dL (ref 1.5–4.5)
Glucose: 93 mg/dL (ref 65–99)
Potassium: 4.2 mmol/L (ref 3.5–5.2)
Sodium: 138 mmol/L (ref 134–144)
Total Protein: 7.2 g/dL (ref 6.0–8.5)

## 2020-11-06 LAB — CBC
Hematocrit: 37.4 % (ref 34.0–46.6)
Hemoglobin: 12.8 g/dL (ref 11.1–15.9)
MCH: 30.8 pg (ref 26.6–33.0)
MCHC: 34.2 g/dL (ref 31.5–35.7)
MCV: 90 fL (ref 79–97)
Platelets: 388 10*3/uL (ref 150–450)
RBC: 4.15 x10E6/uL (ref 3.77–5.28)
RDW: 12.9 % (ref 11.7–15.4)
WBC: 7.4 10*3/uL (ref 3.4–10.8)

## 2020-11-06 LAB — TSH: TSH: 2.53 u[IU]/mL (ref 0.450–4.500)

## 2020-11-06 LAB — VITAMIN D 25 HYDROXY (VIT D DEFICIENCY, FRACTURES): Vit D, 25-Hydroxy: 22.1 ng/mL — ABNORMAL LOW (ref 30.0–100.0)

## 2020-11-11 ENCOUNTER — Other Ambulatory Visit (INDEPENDENT_AMBULATORY_CARE_PROVIDER_SITE_OTHER): Payer: Self-pay

## 2020-11-11 ENCOUNTER — Telehealth: Payer: 59 | Admitting: Family Medicine

## 2020-11-11 DIAGNOSIS — Z1211 Encounter for screening for malignant neoplasm of colon: Secondary | ICD-10-CM

## 2020-11-12 ENCOUNTER — Telehealth: Payer: 59 | Admitting: Family Medicine

## 2020-11-12 ENCOUNTER — Other Ambulatory Visit: Payer: Self-pay

## 2020-11-13 ENCOUNTER — Ambulatory Visit (HOSPITAL_COMMUNITY)
Admission: RE | Admit: 2020-11-13 | Discharge: 2020-11-13 | Disposition: A | Discharge status: Home / Self Care | Payer: 59 | Source: Ambulatory Visit | Attending: Family Medicine | Admitting: Family Medicine

## 2020-11-13 DIAGNOSIS — Z1231 Encounter for screening mammogram for malignant neoplasm of breast: Secondary | ICD-10-CM | POA: Insufficient documentation

## 2020-11-14 ENCOUNTER — Encounter (HOSPITAL_COMMUNITY): Payer: Self-pay | Admitting: Gastroenterology

## 2020-11-15 ENCOUNTER — Other Ambulatory Visit (HOSPITAL_COMMUNITY): Admission: RE | Admit: 2020-11-15 | Payer: 59 | Source: Ambulatory Visit

## 2020-11-15 ENCOUNTER — Encounter (INDEPENDENT_AMBULATORY_CARE_PROVIDER_SITE_OTHER): Payer: Self-pay

## 2020-11-18 ENCOUNTER — Other Ambulatory Visit (HOSPITAL_COMMUNITY)
Admission: RE | Admit: 2020-11-18 | Discharge: 2020-11-18 | Disposition: A | Discharge status: Home / Self Care | Payer: 59 | Source: Ambulatory Visit | Attending: Gastroenterology | Admitting: Gastroenterology

## 2020-11-18 ENCOUNTER — Other Ambulatory Visit: Payer: Self-pay

## 2020-11-18 ENCOUNTER — Other Ambulatory Visit (INDEPENDENT_AMBULATORY_CARE_PROVIDER_SITE_OTHER): Payer: Self-pay

## 2020-11-18 DIAGNOSIS — Z20822 Contact with and (suspected) exposure to covid-19: Secondary | ICD-10-CM | POA: Diagnosis not present

## 2020-11-18 DIAGNOSIS — Z01812 Encounter for preprocedural laboratory examination: Secondary | ICD-10-CM | POA: Insufficient documentation

## 2020-11-18 LAB — SARS CORONAVIRUS 2 (TAT 6-24 HRS): SARS Coronavirus 2: NEGATIVE

## 2020-11-18 LAB — BASIC METABOLIC PANEL
Anion gap: 7 (ref 5–15)
BUN: 24 mg/dL — ABNORMAL HIGH (ref 6–20)
CO2: 27 mmol/L (ref 22–32)
Calcium: 9.2 mg/dL (ref 8.9–10.3)
Chloride: 103 mmol/L (ref 98–111)
Creatinine, Ser: 1.08 mg/dL — ABNORMAL HIGH (ref 0.44–1.00)
GFR, Estimated: >60 mL/min (ref >60)
Glucose, Bld: 122 mg/dL — ABNORMAL HIGH (ref 70–99)
Potassium: 3.8 mmol/L (ref 3.5–5.1)
Sodium: 137 mmol/L (ref 135–145)

## 2020-11-19 ENCOUNTER — Other Ambulatory Visit (HOSPITAL_COMMUNITY): Payer: Self-pay | Admitting: Family Medicine

## 2020-11-19 ENCOUNTER — Encounter (INDEPENDENT_AMBULATORY_CARE_PROVIDER_SITE_OTHER): Payer: Self-pay | Admitting: *Deleted

## 2020-11-19 ENCOUNTER — Ambulatory Visit (HOSPITAL_COMMUNITY): Payer: 59 | Admitting: Anesthesiology

## 2020-11-19 ENCOUNTER — Encounter (HOSPITAL_COMMUNITY)
Admission: RE | Disposition: A | Discharge status: Home / Self Care | Payer: Self-pay | Source: Home / Self Care | Attending: Gastroenterology

## 2020-11-19 ENCOUNTER — Other Ambulatory Visit: Payer: Self-pay

## 2020-11-19 ENCOUNTER — Encounter (HOSPITAL_COMMUNITY): Payer: Self-pay | Admitting: Gastroenterology

## 2020-11-19 ENCOUNTER — Ambulatory Visit (HOSPITAL_COMMUNITY)
Admission: RE | Admit: 2020-11-19 | Discharge: 2020-11-19 | Disposition: A | Discharge status: Home / Self Care | Payer: 59 | Attending: Gastroenterology | Admitting: Gastroenterology

## 2020-11-19 DIAGNOSIS — R928 Other abnormal and inconclusive findings on diagnostic imaging of breast: Secondary | ICD-10-CM

## 2020-11-19 DIAGNOSIS — D122 Benign neoplasm of ascending colon: Secondary | ICD-10-CM | POA: Diagnosis not present

## 2020-11-19 DIAGNOSIS — K648 Other hemorrhoids: Secondary | ICD-10-CM | POA: Insufficient documentation

## 2020-11-19 DIAGNOSIS — Z888 Allergy status to other drugs, medicaments and biological substances status: Secondary | ICD-10-CM | POA: Diagnosis not present

## 2020-11-19 DIAGNOSIS — Z87891 Personal history of nicotine dependence: Secondary | ICD-10-CM | POA: Diagnosis not present

## 2020-11-19 DIAGNOSIS — Z79899 Other long term (current) drug therapy: Secondary | ICD-10-CM | POA: Diagnosis not present

## 2020-11-19 DIAGNOSIS — Z7982 Long term (current) use of aspirin: Secondary | ICD-10-CM | POA: Diagnosis not present

## 2020-11-19 DIAGNOSIS — Z1211 Encounter for screening for malignant neoplasm of colon: Secondary | ICD-10-CM | POA: Insufficient documentation

## 2020-11-19 HISTORY — PX: COLONOSCOPY WITH PROPOFOL: SHX5780

## 2020-11-19 HISTORY — PX: POLYPECTOMY: SHX5525

## 2020-11-19 LAB — HM COLONOSCOPY

## 2020-11-19 SURGERY — COLONOSCOPY WITH PROPOFOL
Anesthesia: General

## 2020-11-19 MED ORDER — LACTATED RINGERS IV SOLN: INTRAVENOUS | Status: DC | PRN

## 2020-11-19 MED ORDER — CHLORHEXIDINE GLUCONATE CLOTH 2 % EX PADS: 6.0000 | MEDICATED_PAD | Freq: Once | CUTANEOUS | Status: DC

## 2020-11-19 MED ORDER — PROPOFOL 500 MG/50ML IV EMUL
INTRAVENOUS | Status: DC | PRN
  Administered 2020-11-19: 150 ug/kg/min via INTRAVENOUS

## 2020-11-19 MED ORDER — PROPOFOL 10 MG/ML IV BOLUS
INTRAVENOUS | Status: AC
  Filled 2020-11-19: qty 40

## 2020-11-19 MED ORDER — PROPOFOL 10 MG/ML IV BOLUS
INTRAVENOUS | Status: DC | PRN
  Administered 2020-11-19: 50 mg via INTRAVENOUS

## 2020-11-19 MED ORDER — EPHEDRINE 5 MG/ML INJ
INTRAVENOUS | Status: AC
  Filled 2020-11-19: qty 10

## 2020-11-19 MED ORDER — EPHEDRINE SULFATE 50 MG/ML IJ SOLN
INTRAMUSCULAR | Status: DC | PRN
  Administered 2020-11-19 (×3): 10 mg via INTRAVENOUS

## 2020-11-19 MED ORDER — LACTATED RINGERS IV SOLN: Freq: Once | INTRAVENOUS | Status: AC

## 2020-11-19 MED ORDER — PROPOFOL 10 MG/ML IV BOLUS
INTRAVENOUS | Status: AC
  Filled 2020-11-19: qty 80

## 2020-11-19 NOTE — Transfer of Care (Signed)
Immediate Anesthesia Transfer of Care Note  Patient: Grace Valdez  Procedure(s) Performed: COLONOSCOPY WITH PROPOFOL (N/A ) POLYPECTOMY  Patient Location: PACU  Anesthesia Type:General  Level of Consciousness: awake, alert , oriented and patient cooperative  Airway & Oxygen Therapy: Patient Spontanous Breathing and Patient connected to nasal cannula oxygen  Post-op Assessment: Report given to RN, Post -op Vital signs reviewed and stable and Patient moving all extremities X 4  Post vital signs: Reviewed and stable  Last Vitals:  Vitals Value Taken Time  BP 96/61 11/19/20 0755  Temp 36.7 C 11/19/20 0755  Pulse 100 11/19/20 0755  Resp 13 11/19/20 0755  SpO2 98 % 11/19/20 0755    Last Pain:  Vitals:   11/19/20 0755  TempSrc: Oral  PainSc:       Patients Stated Pain Goal: 9 (60/47/99 8721)  Complications: No complications documented.

## 2020-11-19 NOTE — Anesthesia Preprocedure Evaluation (Signed)
Anesthesia Evaluation  Patient identified by MRN, date of birth, ID band Patient awake    Reviewed: Allergy & Precautions, NPO status , Patient's Chart, lab work & pertinent test results  History of Anesthesia Complications Negative for: history of anesthetic complications  Airway        Dental  (+) Dental Advisory Given   Pulmonary neg pulmonary ROS, former smoker,    Pulmonary exam normal breath sounds clear to auscultation       Cardiovascular Exercise Tolerance: Good hypertension, Pt. on medications Normal cardiovascular exam Rhythm:Regular Rate:Normal     Neuro/Psych negative neurological ROS  negative psych ROS   GI/Hepatic negative GI ROS, Neg liver ROS,   Endo/Other  negative endocrine ROS  Renal/GU negative Renal ROS     Musculoskeletal negative musculoskeletal ROS (+)   Abdominal   Peds  Hematology negative hematology ROS (+)   Anesthesia Other Findings   Reproductive/Obstetrics negative OB ROS                             Anesthesia Physical Anesthesia Plan  ASA: II  Anesthesia Plan: General   Post-op Pain Management:    Induction: Intravenous  PONV Risk Score and Plan: TIVA  Airway Management Planned: Nasal Cannula and Natural Airway  Additional Equipment:   Intra-op Plan:   Post-operative Plan:   Informed Consent: I have reviewed the patients History and Physical, chart, labs and discussed the procedure including the risks, benefits and alternatives for the proposed anesthesia with the patient or authorized representative who has indicated his/her understanding and acceptance.     Dental advisory given  Plan Discussed with: CRNA and Surgeon  Anesthesia Plan Comments:         Anesthesia Quick Evaluation

## 2020-11-19 NOTE — Discharge Instructions (Signed)
Colonoscopy, Adult, Care After This sheet gives you information about how to care for yourself after your procedure. Your doctor may also give you more specific instructions. If you have problems or questions, call your doctor. What can I expect after the procedure? After the procedure, it is common to have:  A small amount of blood in your poop (stool) for 24 hours.  Some gas.  Mild cramping or bloating in your belly (abdomen). Follow these instructions at home: Eating and drinking  Drink enough fluid to keep your pee (urine) pale yellow.  Follow instructions from your doctor about what you cannot eat or drink.  Return to your normal diet as told by your doctor. Avoid heavy or fried foods that are hard to digest.   Activity  Rest as told by your doctor.  Do not sit for a Ley time without moving. Get up to take short walks every 1-2 hours. This is important. Ask for help if you feel weak or unsteady.  Return to your normal activities as told by your doctor. Ask your doctor what activities are safe for you. To help cramping and bloating:  Try walking around.  Put heat on your belly as told by your doctor. Use the heat source that your doctor recommends, such as a moist heat pack or a heating pad. ? Put a towel between your skin and the heat source. ? Leave the heat on for 20-30 minutes. ? Remove the heat if your skin turns bright red. This is very important if you are unable to feel pain, heat, or cold. You may have a greater risk of getting burned.   General instructions  If you were given a medicine to help you relax (sedative) during your procedure, it can affect you for many hours. Do not drive or use machinery until your doctor says that it is safe.  For the first 24 hours after the procedure: ? Do not sign important documents. ? Do not drink alcohol. ? Do your daily activities more slowly than normal. ? Eat foods that are soft and easy to digest.  Take  over-the-counter or prescription medicines only as told by your doctor.  Keep all follow-up visits as told by your doctor. This is important. Contact a doctor if:  You have blood in your poop 2-3 days after the procedure. Get help right away if:  You have more than a small amount of blood in your poop.  You see large clumps of tissue (blood clots) in your poop.  Your belly is swollen.  You feel like you may vomit (nauseous).  You vomit.  You have a fever.  You have belly pain that gets worse, and medicine does not help your pain. Summary  After the procedure, it is common to have a small amount of blood in your poop. You may also have mild cramping and bloating in your belly.  If you were given a medicine to help you relax (sedative) during your procedure, it can affect you for many hours. Do not drive or use machinery until your doctor says that it is safe.  Get help right away if you have a lot of blood in your poop, feel like you may vomit, have a fever, or have more belly pain. This information is not intended to replace advice given to you by your health care provider. Make sure you discuss any questions you have with your health care provider. Document Revised: 07/07/2019 Document Reviewed: 03/27/2019 Elsevier Patient Education  2021  [employer]   Hemorrhoids Hemorrhoids are swollen veins that may develop:  In the butt (rectum). These are called internal hemorrhoids.  Around the opening of the butt (anus). These are called external hemorrhoids. Hemorrhoids can cause pain, itching, or bleeding. Most of the time, they do not cause serious problems. They usually get better with diet changes, lifestyle changes, and other home treatments. What are the causes? This condition may be caused by:  Having trouble pooping (constipation).  Pushing hard (straining) to poop.  Watery poop (diarrhea).  Pregnancy.  Being very overweight (obese).  Sitting for Mccoin periods  of time.  Heavy lifting or other activity that causes you to strain.  Anal sex.  Riding a bike for a Levier period of time. What are the signs or symptoms? Symptoms of this condition include:  Pain.  Itching or soreness in the butt.  Bleeding from the butt.  Leaking poop.  Swelling in the area.  One or more lumps around the opening of your butt. How is this diagnosed? A doctor can often diagnose this condition by looking at the affected area. The doctor may also:  Do an exam that involves feeling the area with a gloved hand (digital rectal exam).  Examine the area inside your butt using a small tube (anoscope).  Order blood tests. This may be done if you have lost a lot of blood.  Have you get a test that involves looking inside the colon using a flexible tube with a camera on the end (sigmoidoscopy or colonoscopy). How is this treated? This condition can usually be treated at home. Your doctor may tell you to change what you eat, make lifestyle changes, or try home treatments. If these do not help, procedures can be done to remove the hemorrhoids or make them smaller. These may involve:  Placing rubber bands at the base of the hemorrhoids to cut off their blood supply.  Injecting medicine into the hemorrhoids to shrink them.  Shining a type of light energy onto the hemorrhoids to cause them to fall off.  Doing surgery to remove the hemorrhoids or cut off their blood supply. Follow these instructions at home: Eating and drinking  Eat foods that have a lot of fiber in them. These include whole grains, beans, nuts, fruits, and vegetables.  Ask your doctor about taking products that have added fiber (fibersupplements).  Reduce the amount of fat in your diet. You can do this by: ? Eating low-fat dairy products. ? Eating less red meat. ? Avoiding processed foods.  Drink enough fluid to keep your pee (urine) pale yellow.   Managing pain and swelling  Take a warm-water  bath (sitz bath) for 20 minutes to ease pain. Do this 3-4 times a day. You may do this in a bathtub or using a portable sitz bath that fits over the toilet.  If told, put ice on the painful area. It may be helpful to use ice between your warm baths. ? Put ice in a plastic bag. ? Place a towel between your skin and the bag. ? Leave the ice on for 20 minutes, 2-3 times a day.   General instructions  Take over-the-counter and prescription medicines only as told by your doctor. ? Medicated creams and medicines may be used as told.  Exercise often. Ask your doctor how much and what kind of exercise is best for you.  Go to the bathroom when you have the urge to poop. Do not wait.  Avoid pushing too  hard when you poop.  Keep your butt dry and clean. Use wet toilet paper or moist towelettes after pooping.  Do not sit on the toilet for a Merrihew time.  Keep all follow-up visits as told by your doctor. This is important. Contact a doctor if you:  Have pain and swelling that do not get better with treatment or medicine.  Have trouble pooping.  Cannot poop.  Have pain or swelling outside the area of the hemorrhoids. Get help right away if you have:  Bleeding that will not stop. Summary  Hemorrhoids are swollen veins in the butt or around the opening of the butt.  They can cause pain, itching, or bleeding.  Eat foods that have a lot of fiber in them. These include whole grains, beans, nuts, fruits, and vegetables.  Take a warm-water bath (sitz bath) for 20 minutes to ease pain. Do this 3-4 times a day. This information is not intended to replace advice given to you by your health care provider. Make sure you discuss any questions you have with your health care provider. Document Revised: 09/08/2018 Document Reviewed: 01/20/2018 Elsevier Patient Education  [place].  Colon Polyps  Colon polyps are tissue growths inside the colon, which is part of the large intestine. They  are one of the types of polyps that can grow in the body. A polyp may be a round bump or a mushroom-shaped growth. You could have one polyp or more than one. Most colon polyps are noncancerous (benign). However, some colon polyps can become cancerous over time. Finding and removing the polyps early can help prevent this. What are the causes? The exact cause of colon polyps is not known. What increases the risk? The following factors may make you more likely to develop this condition:  Having a family history of colorectal cancer or colon polyps.  Being older than 57 years of age.  Being younger than 57 years of age and having a significant family history of colorectal cancer or colon polyps or a genetic condition that puts you at higher risk of getting colon polyps.  Having inflammatory bowel disease, such as ulcerative colitis or Crohn's disease.  Having certain conditions passed from parent to child (hereditary conditions), such as: ? Familial adenomatous polyposis (FAP). ? Lynch syndrome. ? Turcot syndrome. ? Peutz-Jeghers syndrome. ? MUTYH-associated polyposis (MAP).  Being overweight.  Certain lifestyle factors. These include smoking cigarettes, drinking too much alcohol, not getting enough exercise, and eating a diet that is high in fat and red meat and low in fiber.  Having had childhood cancer that was treated with radiation of the abdomen. What are the signs or symptoms? Many times, there are no symptoms. If you have symptoms, they may include:  Blood coming from the rectum during a bowel movement.  Blood in the stool (feces). The blood may be bright red or very dark in color.  Pain in the abdomen.  A change in bowel habits, such as constipation or diarrhea. How is this diagnosed? This condition is diagnosed with a colonoscopy. This is a procedure in which a lighted, flexible scope is inserted into the opening between the buttocks (anus) and then passed into the colon  to examine the area. Polyps are sometimes found when a colonoscopy is done as part of routine cancer screening tests. How is this treated? This condition is treated by removing any polyps that are found. Most polyps can be removed during a colonoscopy. Those polyps will then be tested for  cancer. Additional treatment may be needed depending on the results of testing. Follow these instructions at home: Eating and drinking  Eat foods that are high in fiber, such as fruits, vegetables, and whole grains.  Eat foods that are high in calcium and vitamin D, such as milk, cheese, yogurt, eggs, liver, fish, and broccoli.  Limit foods that are high in fat, such as fried foods and desserts.  Limit the amount of red meat, precooked or cured meat, or other processed meat that you eat, such as hot dogs, sausages, bacon, or meat loaves.  Limit sugary drinks.   Lifestyle  Maintain a healthy weight, or lose weight if recommended by your health care provider.  Exercise every day or as told by your health care provider.  Do not use any products that contain nicotine or tobacco, such as cigarettes, e-cigarettes, and chewing tobacco. If you need help quitting, ask your health care provider.  Do not drink alcohol if: ? Your health care provider tells you not to drink. ? You are pregnant, may be pregnant, or are planning to become pregnant.  If you drink alcohol: ? Limit how much you use to:  0-1 drink a day for women.  0-2 drinks a day for men. ? Know how much alcohol is in your drink. In the [place] one drink equals one 12 oz bottle of beer (355 mL), one 5 oz glass of wine (148 mL), or one 1 oz glass of hard liquor (44 mL). General instructions  Take over-the-counter and prescription medicines only as told by your health care provider.  Keep all follow-up visits. This is important. This includes having regularly scheduled colonoscopies. Talk to your health care provider about when you need a  colonoscopy. Contact a health care provider if:  You have new or worsening bleeding during a bowel movement.  You have new or increased blood in your stool.  You have a change in bowel habits.  You lose weight for no known reason. Summary  Colon polyps are tissue growths inside the colon, which is part of the large intestine. They are one type of polyp that can grow in the body.  Most colon polyps are noncancerous (benign), but some can become cancerous over time.  This condition is diagnosed with a colonoscopy.  This condition is treated by removing any polyps that are found. Most polyps can be removed during a colonoscopy. This information is not intended to replace advice given to you by your health care provider. Make sure you discuss any questions you have with your health care provider. Document Revised: 12/20/2019 Document Reviewed: 12/20/2019 Elsevier Patient Education  2021 [place] are being discharged to home.  Resume your previous diet.  Your physician has recommended a repeat colonoscopy in five years for surveillance.

## 2020-11-19 NOTE — H&P (Signed)
Grace Valdez is an 55 y.o. female.   Chief Complaint: screening colonoscopy HPI: 55 year old female with past medical history of hypertension, coming for screening colonoscopy. The patient reported that she had a colonoscopy a little close than 10 years ago which was unremarkable.  The patient denies having any complaints such as melena, hematochezia, abdominal pain or distention, change in her bowel movement consistency or frequency, no changes in her weight recently.  No family history of colorectal cancer.   Past Medical History:  Diagnosis Date  . Hypertension     Past Surgical History:  Procedure Laterality Date  . [place]    History reviewed. No pertinent family history. Social History:  reports that she has quit smoking. She has never used smokeless tobacco. She reports current alcohol use. She reports that she does not use drugs.  Allergies:  Allergies  Allergen Reactions  . Phentermine Nausea And Vomiting    Medications Prior to Admission  Medication Sig Dispense Refill  . Calcium Carb-Cholecalciferol (CALCIUM + D3 PO) Take 1 tablet by mouth 2 (two) times a week.    . EQ LORATADINE 10 MG tablet Take 1 tablet by mouth once daily (Patient taking differently: Take 10 mg by mouth daily as needed for allergies.) 30 tablet 0  . Phenyleph-Doxylamine-DM-APAP (NYQUIL SEVERE COLD/FLU) 5-6.25-10-325 MG/15ML LIQD Take 15-30 mLs by mouth at bedtime as needed (cold symptoms.).     Marland Kitchen triamterene-hydrochlorothiazide (MAXZIDE-25) 37.5-25 MG tablet Take 1 tablet by mouth once daily (Patient taking differently: Take 1 tablet by mouth daily.) 90 tablet 0  . Aspirin-Salicylamide-Caffeine (BC FAST PAIN RELIEF) 650-195-33.3 MG PACK Take 1 packet by mouth 2 (two) times daily as needed (pain.).    Marland Kitchen PLENVU 140 g SOLR Take 140 g by mouth as directed.      Results for orders placed or performed during the hospital encounter of 11/18/20 (from the past 48 hour(s))  SARS CORONAVIRUS 2  (TAT 6-24 HRS) Nasopharyngeal Nasopharyngeal Swab     Status: None   Collection Time: 11/18/20  8:01 AM   Specimen: Nasopharyngeal Swab  Result Value Ref Range   SARS Coronavirus 2 NEGATIVE NEGATIVE    Comment: (NOTE) SARS-CoV-2 target nucleic acids are NOT DETECTED.  The SARS-CoV-2 RNA is generally detectable in upper and lower respiratory specimens during the acute phase of infection. Negative results do not preclude SARS-CoV-2 infection, do not rule out co-infections with other pathogens, and should not be used as the sole basis for treatment or other patient management decisions. Negative results must be combined with clinical observations, patient history, and epidemiological information. The expected result is Negative.  Fact Sheet for Patients: SugarRoll.be  Fact Sheet for Healthcare Providers: https://www.woods-mathews.com/  This test is not yet approved or cleared by the [place] FDA and  has been authorized for detection and/or diagnosis of SARS-CoV-2 by FDA under an Emergency Use Authorization (EUA). This EUA will remain  in effect (meaning this test can be used) for the duration of the COVID-19 declaration under Se ction 564(b)(1) of the Act, 21 U.S.C. section 360bbb-3(b)(1), unless the authorization is terminated or revoked sooner.  Performed at [facility], [place] [address] [place], [place] [address]   Basic metabolic panel     Status: Abnormal   Collection Time: 11/18/20  8:30 AM  Result Value Ref Range   Sodium 137 135 - 145 mmol/L   Potassium 3.8 3.5 - 5.1 mmol/L   Chloride 103 98 - 111 mmol/L  CO2 27 22 - 32 mmol/L   Glucose, Bld 122 (H) 70 - 99 mg/dL    Comment: Glucose reference range applies only to samples taken after fasting for at least 8 hours.   BUN 24 (H) 6 - 20 mg/dL   Creatinine, Ser 1.08 (H) 0.44 - 1.00 mg/dL   Calcium 9.2 8.9 - 10.3 mg/dL   GFR, Estimated >60 >60 mL/min    Comment:  (NOTE) Calculated using the CKD-EPI Creatinine Equation (2021)    Anion gap 7 5 - 15    Comment: Performed at [facility], [address], [place],  [address]   No results found.  Review of Systems  Constitutional: Negative.   HENT: Negative.   Eyes: Negative.   Respiratory: Negative.   Cardiovascular: Negative.   Gastrointestinal: Negative.   Endocrine: Negative.   Genitourinary: Negative.   Musculoskeletal: Negative.   Skin: Negative.   Allergic/Immunologic: Negative.   Neurological: Negative.   Hematological: Negative.   Psychiatric/Behavioral: Negative.     Blood pressure (!) 153/79, temperature 98 F (36.7 C), temperature source Oral, resp. rate 14, height 5\' 5"  (1.651 m), weight 83.9 kg, last menstrual period 01/24/2013, SpO2 99 %. Physical Exam  GENERAL: The patient is AO x3, in no acute distress. HEENT: Head is normocephalic and atraumatic. EOMI are intact. Mouth is well hydrated and without lesions. NECK: Supple. No masses LUNGS: Clear to auscultation. No presence of rhonchi/wheezing/rales. Adequate chest expansion HEART: RRR, normal s1 and s2. ABDOMEN: Soft, nontender, no guarding, no peritoneal signs, and nondistended. BS +. No masses. EXTREMITIES: Without any cyanosis, clubbing, rash, lesions or edema. NEUROLOGIC: AOx3, no focal motor deficit. SKIN: no jaundice, no rashes  Assessment/Plan 55 year old female with past medical history of hypertension, coming for screening colonoscopy. The patient is at average risk for colorectal cancer.  We will proceed with colonoscopy today.   Harvel Quale, MD 11/19/2020, 7:16 AM

## 2020-11-19 NOTE — Op Note (Signed)
[facility] Patient Name: Grace Valdez Procedure Date: 11/19/2020 7:18 AM MRN: [id number] Date of Birth: [birth year] Attending MD: Maylon Peppers ,  CSN: [id number] Age: 55 Admit Type: Outpatient Procedure:                Colonoscopy Indications:              Screening for colorectal malignant neoplasm Providers:                Maylon Peppers, Janeece Riggers, RN, Raphael Gibney,                            Technician Referring MD:              Medicines:                Monitored Anesthesia Care Complications:            No immediate complications. Estimated Blood Loss:     Estimated blood loss: none. Procedure:                Pre-Anesthesia Assessment:                           - Prior to the procedure, a History and Physical                            was performed, and patient medications, allergies                            and sensitivities were reviewed. The patient's                            tolerance of previous anesthesia was reviewed.                           - The risks and benefits of the procedure and the                            sedation options and risks were discussed with the                            patient. All questions were answered and informed                            consent was obtained.                           - ASA Grade Assessment: II - A patient with mild                            systemic disease.                           After obtaining informed consent, the colonoscope                            was passed under direct vision. Throughout the  procedure, the patient's blood pressure, pulse, and                            oxygen saturations were monitored continuously. The                            [id number]) was introduced through the anus                            and advanced to the the cecum, identified by                            appendiceal orifice and ileocecal valve. The                             colonoscopy was performed without difficulty. The                            patient tolerated the procedure well. The quality                            of the bowel preparation was excellent. Scope                            withdrawal time was 12 minutes. Scope In: 7:37:16 AM Scope Out: 7:50:55 AM Scope Withdrawal Time: 0 hours 13 minutes 9 seconds  Total Procedure Duration: 0 hours 13 minutes 39 seconds  Findings:      Hemorrhoids were found on perianal exam.      A 6 mm polyp was found in the ascending colon. The polyp was sessile.       The polyp was removed with a cold snare. Resection was complete, but the       polyp tissue was not retrieved.      Non-bleeding internal hemorrhoids were found during retroflexion. The       hemorrhoids were small. Impression:               - Hemorrhoids found on perianal exam.                           - One 6 mm polyp in the ascending colon, removed                            with a cold snare. Complete resection. Polyp tissue                            not retrieved.                           - Non-bleeding internal hemorrhoids. Moderate Sedation:      Per Anesthesia Care Recommendation:           - Discharge patient to home (ambulatory).                           - Resume previous diet.                           -  Repeat colonoscopy in 5 years for surveillance. Procedure Code(s):        --- Professional ---                           [phone], Colonoscopy, flexible; with removal of                            tumor(s), polyp(s), or other lesion(s) by snare                            technique Diagnosis Code(s):        --- Professional ---                           Z12.11, Encounter for screening for malignant                            neoplasm of colon                           K64.8, Other hemorrhoids                           K63.5, Polyp of colon CPT copyright 2019 American Medical Association. All rights reserved. The codes  documented in this report are preliminary and upon coder review may  be revised to meet current compliance requirements. Maylon Peppers, MD Maylon Peppers,  11/19/2020 8:01:33 AM This report has been signed electronically. Number of Addenda: 0

## 2020-11-19 NOTE — Anesthesia Postprocedure Evaluation (Signed)
Anesthesia Post Note  Patient: Grace Valdez  Procedure(s) Performed: COLONOSCOPY WITH PROPOFOL (N/A ) POLYPECTOMY  Patient location during evaluation: Phase II Anesthesia Type: General Level of consciousness: awake, oriented, awake and alert and patient cooperative Pain management: satisfactory to patient Vital Signs Assessment: post-procedure vital signs reviewed and stable Respiratory status: spontaneous breathing, respiratory function stable and nonlabored ventilation Cardiovascular status: stable Postop Assessment: no apparent nausea or vomiting Anesthetic complications: no   No complications documented.   Last Vitals:  Vitals:   11/19/20 0651 11/19/20 0755  BP: (!) 153/79 96/61  Pulse:  100  Resp: 14 13  Temp: 36.7 C 36.7 C  SpO2: 99% 98%    Last Pain:  Vitals:   11/19/20 0755  TempSrc: Oral  PainSc:                  Willa Rough

## 2020-11-22 ENCOUNTER — Encounter (HOSPITAL_COMMUNITY): Payer: Self-pay | Admitting: Gastroenterology

## 2020-11-26 ENCOUNTER — Ambulatory Visit (HOSPITAL_COMMUNITY)
Admission: RE | Admit: 2020-11-26 | Discharge: 2020-11-26 | Disposition: A | Discharge status: Home / Self Care | Payer: 59 | Source: Ambulatory Visit | Attending: Family Medicine | Admitting: Family Medicine

## 2020-11-26 ENCOUNTER — Encounter (HOSPITAL_COMMUNITY): Payer: 59

## 2020-11-26 ENCOUNTER — Ambulatory Visit (HOSPITAL_COMMUNITY): Payer: 59

## 2020-11-26 DIAGNOSIS — R928 Other abnormal and inconclusive findings on diagnostic imaging of breast: Secondary | ICD-10-CM

## 2020-12-11 ENCOUNTER — Other Ambulatory Visit: Payer: Self-pay | Admitting: Family Medicine

## 2020-12-19 ENCOUNTER — Ambulatory Visit: Payer: 59 | Admitting: Family Medicine

## 2021-01-01 ENCOUNTER — Ambulatory Visit: Payer: 59 | Admitting: Family Medicine

## 2021-01-01 ENCOUNTER — Other Ambulatory Visit: Payer: Self-pay

## 2021-01-01 ENCOUNTER — Encounter: Payer: Self-pay | Admitting: Family Medicine

## 2021-01-01 VITALS — BP 133/77 | HR 77 | Temp 98.6°F | Ht 65.0 in | Wt 197.0 lb

## 2021-01-01 DIAGNOSIS — E669 Obesity, unspecified: Secondary | ICD-10-CM | POA: Diagnosis not present

## 2021-01-01 DIAGNOSIS — R7302 Impaired glucose tolerance (oral): Secondary | ICD-10-CM

## 2021-01-01 DIAGNOSIS — E785 Hyperlipidemia, unspecified: Secondary | ICD-10-CM | POA: Diagnosis not present

## 2021-01-01 DIAGNOSIS — I10 Essential (primary) hypertension: Secondary | ICD-10-CM

## 2021-01-01 DIAGNOSIS — J302 Other seasonal allergic rhinitis: Secondary | ICD-10-CM

## 2021-01-01 LAB — POCT GLYCOSYLATED HEMOGLOBIN (HGB A1C): HbA1c, POC (prediabetic range): 6.1 % (ref 5.7–6.4)

## 2021-01-01 MED ORDER — ERGOCALCIFEROL 1.25 MG (50000 UT) PO CAPS: 50000.0000 [IU] | ORAL_CAPSULE | ORAL | 1 refills | Status: DC

## 2021-01-01 MED ORDER — FLUTICASONE PROPIONATE 50 MCG/ACT NA SUSP: 2.0000 | Freq: Every day | NASAL | 3 refills | Status: DC

## 2021-01-01 MED ORDER — OLOPATADINE HCL 0.1 % OP SOLN: 1.0000 [drp] | Freq: Two times a day (BID) | OPHTHALMIC | 2 refills | Status: DC

## 2021-01-01 NOTE — Addendum Note (Signed)
Addended by: Eual Fines on: 01/01/2021 10:27 AM   Modules accepted: Orders

## 2021-01-01 NOTE — Assessment & Plan Note (Signed)
  Patient re-educated about  the importance of commitment to a  minimum of 150 minutes of exercise per week as able.  The importance of healthy food choices with portion control discussed, as well as eating regularly and within a 12 hour window most days. The need to choose "clean , green" food 50 to 75% of the time is discussed, as well as to make water the primary drink and set a goal of 64 ounces water daily.    Weight /BMI 01/01/2021 11/19/2020 05/08/2020  WEIGHT 197 lb 185 lb 196 lb  HEIGHT 5\' 5"  5\' 5"  5\' 5"   BMI 32.78 kg/m2 30.79 kg/m2 32.62 kg/m2

## 2021-01-01 NOTE — Assessment & Plan Note (Signed)
Patient educated about the importance of limiting  Carbohydrate intake , the need to commit to daily physical activity for a minimum of 30 minutes , and to commit weight loss. The fact that changes in all these areas will reduce or eliminate all together the development of diabetes is stressed.   Diabetic Labs Latest Ref Rng & Units 11/18/2020 11/05/2020 09/24/2020 05/09/2020 11/06/2019  HbA1c 4.0 - 5.6 % - - - 5.9(A) 6.0(H)  Chol 100 - 199 mg/dL - 223(H) - - 189  HDL >39 mg/dL - 72 - - 62  Calc LDL 0 - 99 mg/dL - 138(H) - - 111(H)  Triglycerides 0 - 149 mg/dL - 75 - - 74  Creatinine 0.44 - 1.00 mg/dL 1.08(H) 1.07(H) 1.03(H) - 0.95   BP/Weight 01/01/2021 11/19/2020 05/08/2020 06/15/2019 06/05/2019 01/30/2019 74/04/2706  Systolic BP 867 96 544 920 100 712 197  Diastolic BP 77 61 70 58 78 78 72  Wt. (Lbs) 197 185 196 190.12 186 196 193  BMI 32.78 30.79 32.62 31.64 31.93 33.64 33.13   No flowsheet data found.   Updated lab needed at/ before next visit.

## 2021-01-01 NOTE — Assessment & Plan Note (Signed)
Controlled, no change in medication DASH diet and commitment to daily physical activity for a minimum of 30 minutes discussed and encouraged, as a part of hypertension management. The importance of attaining a healthy weight is also discussed.  BP/Weight 01/01/2021 11/19/2020 05/08/2020 06/15/2019 06/05/2019 01/30/2019 27/0/3500  Systolic BP 938 96 182 993 716 967 893  Diastolic BP 77 61 70 58 78 78 72  Wt. (Lbs) 197 185 196 190.12 186 196 193  BMI 32.78 30.79 32.62 31.64 31.93 33.64 33.13

## 2021-01-01 NOTE — Assessment & Plan Note (Signed)
Hyperlipidemia:Low fat diet discussed and encouraged.   Lipid Panel  Lab Results  Component Value Date   CHOL 223 (H) 11/05/2020   HDL 72 11/05/2020   LDLCALC 138 (H) 11/05/2020   TRIG 75 11/05/2020   CHOLHDL 3.1 11/05/2020     Updated lab needed at/ before next visit.

## 2021-01-01 NOTE — Assessment & Plan Note (Signed)
Uncontrolled add flonase

## 2021-01-01 NOTE — Patient Instructions (Signed)
Annual physical exam in office in 6 months, call if you need me sooner  Fasting lipid, bmp and eGFr 5 days before next visit New is once weekly vit D if too expensive take OTC daily vit D3 , 1000 units , two daily  Need to reduce fat and sugar in your diet  It is important that you exercise regularly at least 30 minutes 5 times a week. If you develop chest pain, have severe difficulty breathing, or feel very tired, stop exercising immediately and seek medical attention   I have recommended the following steps for improving diabetic care and outcome to her: low cholesterol diet, weight control and daily exercise discussed and home glucose monitoring emphasized. A followup visit will be scheduled in the near future to review and reinforce the importance of careful diabetic control to improve [place] term outcomes. Thanks for choosing [facility], we consider it a privelige to serve you.   Heart-Healthy Eating Plan Heart-healthy meal planning includes:  Eating less unhealthy fats.  Eating more healthy fats.  Making other changes in your diet. Talk with your doctor or a diet specialist (dietitian) to create an eating plan that is right for you. What is my plan? Your doctor may recommend an eating plan that includes:  Total fat: ______% or less of total calories a day.  Saturated fat: ______% or less of total calories a day.  Cholesterol: less than _________mg a day. What are tips for following this plan? Cooking Avoid frying your food. Try to bake, boil, grill, or broil it instead. You can also reduce fat by:  Removing the skin from poultry.  Removing all visible fats from meats.  Steaming vegetables in water or broth. Meal planning  At meals, divide your plate into four equal parts: ? Fill one-half of your plate with vegetables and green salads. ? Fill one-fourth of your plate with whole grains. ? Fill one-fourth of your plate with lean protein foods.  Eat 4-5  servings of vegetables per day. A serving of vegetables is: ? 1 cup of raw or cooked vegetables. ? 2 cups of raw leafy greens.  Eat 4-5 servings of fruit per day. A serving of fruit is: ? 1 medium whole fruit. ?  cup of dried fruit. ?  cup of fresh, frozen, or canned fruit. ?  cup of 100% fruit juice.  Eat more foods that have soluble fiber. These are apples, broccoli, carrots, beans, peas, and barley. Try to get 20-30 g of fiber per day.  Eat 4-5 servings of nuts, legumes, and seeds per week: ? 1 serving of dried beans or legumes equals  cup after being cooked. ? 1 serving of nuts is  cup. ? 1 serving of seeds equals 1 tablespoon.   General information  Eat more home-cooked food. Eat less restaurant, buffet, and fast food.  Limit or avoid alcohol.  Limit foods that are high in starch and sugar.  Avoid fried foods.  Lose weight if you are overweight.  Keep track of how much salt (sodium) you eat. This is important if you have high blood pressure. Ask your doctor to tell you more about this.  Try to add vegetarian meals each week. Fats  Choose healthy fats. These include olive oil and canola oil, flaxseeds, walnuts, almonds, and seeds.  Eat more omega-3 fats. These include salmon, mackerel, sardines, tuna, flaxseed oil, and ground flaxseeds. Try to eat fish at least 2 times each week.  Check food labels. Avoid foods with  trans fats or high amounts of saturated fat.  Limit saturated fats. ? These are often found in animal products, such as meats, butter, and cream. ? These are also found in plant foods, such as palm oil, palm kernel oil, and coconut oil.  Avoid foods with partially hydrogenated oils in them. These have trans fats. Examples are stick margarine, some tub margarines, cookies, crackers, and other baked goods. What foods can I eat? Fruits All fresh, canned (in natural juice), or frozen fruits. Vegetables Fresh or frozen vegetables (raw, steamed, roasted,  or grilled). Green salads. Grains Most grains. Choose whole wheat and whole grains most of the time. Rice and pasta, including brown rice and pastas made with whole wheat. Meats and other proteins Lean, well-trimmed beef, veal, pork, and lamb. Chicken and [place] without skin. All fish and shellfish. Wild duck, rabbit, pheasant, and venison. Egg whites or low-cholesterol egg substitutes. Dried beans, peas, lentils, and tofu. Seeds and most nuts. Dairy Low-fat or nonfat cheeses, including ricotta and mozzarella. Skim or 1% milk that is liquid, powdered, or evaporated. Buttermilk that is made with low-fat milk. Nonfat or low-fat yogurt. Fats and oils Non-hydrogenated (trans-free) margarines. Vegetable oils, including soybean, sesame, sunflower, olive, peanut, safflower, corn, canola, and cottonseed. Salad dressings or mayonnaise made with a vegetable oil. Beverages Mineral water. Coffee and tea. Diet carbonated beverages. Sweets and desserts Sherbet, gelatin, and fruit ice. Small amounts of dark chocolate. Limit all sweets and desserts. Seasonings and condiments All seasonings and condiments. The items listed above may not be a complete list of foods and drinks you can eat. Contact a dietitian for more options. What foods should I avoid? Fruits Canned fruit in heavy syrup. Fruit in cream or butter sauce. Fried fruit. Limit coconut. Vegetables Vegetables cooked in cheese, cream, or butter sauce. Fried vegetables. Grains Breads that are made with saturated or trans fats, oils, or whole milk. Croissants. Sweet rolls. Donuts. High-fat crackers, such as cheese crackers. Meats and other proteins Fatty meats, such as hot dogs, ribs, sausage, bacon, rib-eye roast or steak. High-fat deli meats, such as salami and bologna. Caviar. Domestic duck and goose. Organ meats, such as liver. Dairy Cream, sour cream, cream cheese, and creamed cottage cheese. Whole-milk cheeses. Whole or 2% milk that is liquid,  evaporated, or condensed. Whole buttermilk. Cream sauce or high-fat cheese sauce. Yogurt that is made from whole milk. Fats and oils Meat fat, or shortening. Cocoa butter, hydrogenated oils, palm oil, coconut oil, palm kernel oil. Solid fats and shortenings, including bacon fat, salt pork, lard, and butter. Nondairy cream substitutes. Salad dressings with cheese or sour cream. Beverages Regular sodas and juice drinks with added sugar. Sweets and desserts Frosting. Pudding. Cookies. Cakes. Pies. Milk chocolate or white chocolate. Buttered syrups. Full-fat ice cream or ice cream drinks. The items listed above may not be a complete list of foods and drinks to avoid. Contact a dietitian for more information. Summary  Heart-healthy meal planning includes eating less unhealthy fats, eating more healthy fats, and making other changes in your diet.  Eat a balanced diet. This includes fruits and vegetables, low-fat or nonfat dairy, lean protein, nuts and legumes, whole grains, and heart-healthy oils and fats. This information is not intended to replace advice given to you by your health care provider. Make sure you discuss any questions you have with your health care provider. Document Revised: 11/04/2017 Document Reviewed: 10/08/2017 Elsevier Patient Education  2021 [employer].

## 2021-01-01 NOTE — Progress Notes (Signed)
Grace Valdez     MRN: [id number]      DOB: Jun 04, 1966   HPI Grace Valdez is here for follow up and re-evaluation of chronic medical conditions, medication management and review of any available recent lab and radiology data.  Preventive health is updated, specifically  Cancer screening and Immunization.   Questions or concerns regarding consultations or procedures which the PT has had in the interim are  addressed. The PT denies any adverse reactions to current medications since the last visit.  There are no new concerns.  There are no specific complaints   ROS Denies recent fever or chills. Denies ear pain or sore throat.c/o nasal congestion and sinus pressure Denies chest congestion, productive cough or wheezing. Denies chest pains, palpitations and leg swelling Denies abdominal pain, nausea, vomiting,diarrhea or constipation.   Denies dysuria, frequency, hesitancy or incontinence. Denies joint pain, swelling and limitation in mobility. Denies headaches, seizures, numbness, or tingling. Denies depression, anxiety or insomnia. Denies skin break down or rash.   PE  BP 133/77 (BP Location: Right Arm, Patient Position: Sitting, Cuff Size: Normal)   Pulse 77   Temp 98.6 F (37 C) (Temporal)   Ht 5\' 5"  (1.651 m)   Wt 197 lb (89.4 kg)   LMP 01/24/2013   SpO2 99%   BMI 32.78 kg/m   Patient alert and oriented and in no cardiopulmonary distress.  HEENT: No facial asymmetry, EOMI,     Neck supple .  Chest: Clear to auscultation bilaterally.  CVS: S1, S2 no murmurs, no S3.Regular rate.  ABD: Soft non tender.   Ext: No edema  MS: Adequate ROM spine, shoulders, hips and knees.  Skin: Intact, no ulcerations or rash noted.  Psych: Good eye contact, normal affect. Memory intact not anxious or depressed appearing.  CNS: CN 2-12 intact, power,  normal throughout.no focal deficits noted.   Assessment & Plan  Essential hypertension Controlled, no change in medication DASH  diet and commitment to daily physical activity for a minimum of 30 minutes discussed and encouraged, as a part of hypertension management. The importance of attaining a healthy weight is also discussed.  BP/Weight 01/01/2021 11/19/2020 05/08/2020 06/15/2019 06/05/2019 01/30/2019 10/19/4268  Systolic BP 623 96 762 831 517 616 073  Diastolic BP 77 61 70 58 78 78 72  Wt. (Lbs) 197 185 196 190.12 186 196 193  BMI 32.78 30.79 32.62 31.64 31.93 33.64 33.13       IGT (impaired glucose tolerance) Patient educated about the importance of limiting  Carbohydrate intake , the need to commit to daily physical activity for a minimum of 30 minutes , and to commit weight loss. The fact that changes in all these areas will reduce or eliminate all together the development of diabetes is stressed.   Diabetic Labs Latest Ref Rng & Units 11/18/2020 11/05/2020 09/24/2020 05/09/2020 11/06/2019  HbA1c 4.0 - 5.6 % - - - 5.9(A) 6.0(H)  Chol 100 - 199 mg/dL - 223(H) - - 189  HDL >39 mg/dL - 72 - - 62  Calc LDL 0 - 99 mg/dL - 138(H) - - 111(H)  Triglycerides 0 - 149 mg/dL - 75 - - 74  Creatinine 0.44 - 1.00 mg/dL 1.08(H) 1.07(H) 1.03(H) - 0.95   BP/Weight 01/01/2021 11/19/2020 05/08/2020 06/15/2019 06/05/2019 01/30/2019 71/0/6269  Systolic BP 485 96 462 703 500 938 182  Diastolic BP 77 61 70 58 78 78 72  Wt. (Lbs) 197 185 196 190.12 186 196 193  BMI 32.78 30.79  32.62 31.64 31.93 33.64 33.13   No flowsheet data found.   Updated lab needed at/ before next visit.   Dyslipidemia, goal LDL below 100 Hyperlipidemia:Low fat diet discussed and encouraged.   Lipid Panel  Lab Results  Component Value Date   CHOL 223 (H) 11/05/2020   HDL 72 11/05/2020   LDLCALC 138 (H) 11/05/2020   TRIG 75 11/05/2020   CHOLHDL 3.1 11/05/2020     Updated lab needed at/ before next visit.   Obesity (BMI 30.0-34.9)  Patient re-educated about  the importance of commitment to a  minimum of 150 minutes of exercise per week as able.  The  importance of healthy food choices with portion control discussed, as well as eating regularly and within a 12 hour window most days. The need to choose "clean , green" food 50 to 75% of the time is discussed, as well as to make water the primary drink and set a goal of 64 ounces water daily.    Weight /BMI 01/01/2021 11/19/2020 05/08/2020  WEIGHT 197 lb 185 lb 196 lb  HEIGHT 5\' 5"  5\' 5"  5\' 5"   BMI 32.78 kg/m2 30.79 kg/m2 32.62 kg/m2      Seasonal allergies Uncontrolled add flonase

## 2021-01-01 NOTE — Addendum Note (Signed)
Addended by: Laretta Bolster on: 01/01/2021 10:18 AM   Modules accepted: Orders

## 2021-04-09 ENCOUNTER — Other Ambulatory Visit: Payer: Self-pay | Admitting: Family Medicine

## 2021-07-02 LAB — LIPID PANEL
Chol/HDL Ratio: 3.6 ratio (ref 0.0–4.4)
Cholesterol, Total: 211 mg/dL — ABNORMAL HIGH (ref 100–199)
HDL: 58 mg/dL (ref >39)
LDL Chol Calc (NIH): 129 mg/dL — ABNORMAL HIGH (ref 0–99)
Triglycerides: 135 mg/dL (ref 0–149)
VLDL Cholesterol Cal: 24 mg/dL (ref 5–40)

## 2021-07-02 LAB — BASIC METABOLIC PANEL
BUN/Creatinine Ratio: 13 (ref 9–23)
BUN: 15 mg/dL (ref 6–24)
CO2: 26 mmol/L (ref 20–29)
Calcium: 9.1 mg/dL (ref 8.7–10.2)
Chloride: 101 mmol/L (ref 96–106)
Creatinine, Ser: 1.12 mg/dL — ABNORMAL HIGH (ref 0.57–1.00)
Glucose: 96 mg/dL (ref 70–99)
Potassium: 3.8 mmol/L (ref 3.5–5.2)
Sodium: 141 mmol/L (ref 134–144)
eGFR: 57 mL/min/{1.73_m2} — ABNORMAL LOW (ref >59)

## 2021-07-03 ENCOUNTER — Encounter: Payer: Self-pay | Admitting: Family Medicine

## 2021-07-03 ENCOUNTER — Other Ambulatory Visit: Payer: Self-pay

## 2021-07-03 ENCOUNTER — Other Ambulatory Visit (HOSPITAL_COMMUNITY): Payer: Self-pay | Admitting: Family Medicine

## 2021-07-03 ENCOUNTER — Ambulatory Visit (HOSPITAL_COMMUNITY)
Admission: RE | Admit: 2021-07-03 | Discharge: 2021-07-03 | Disposition: A | Discharge status: Home / Self Care | Payer: 59 | Source: Ambulatory Visit | Attending: Family Medicine | Admitting: Family Medicine

## 2021-07-03 ENCOUNTER — Encounter (INDEPENDENT_AMBULATORY_CARE_PROVIDER_SITE_OTHER): Payer: Self-pay

## 2021-07-03 ENCOUNTER — Ambulatory Visit (INDEPENDENT_AMBULATORY_CARE_PROVIDER_SITE_OTHER): Payer: 59 | Admitting: Family Medicine

## 2021-07-03 VITALS — BP 125/70 | HR 78 | Resp 18 | Ht 65.0 in | Wt 198.1 lb

## 2021-07-03 DIAGNOSIS — E669 Obesity, unspecified: Secondary | ICD-10-CM | POA: Diagnosis not present

## 2021-07-03 DIAGNOSIS — J302 Other seasonal allergic rhinitis: Secondary | ICD-10-CM | POA: Diagnosis not present

## 2021-07-03 DIAGNOSIS — G8929 Other chronic pain: Secondary | ICD-10-CM | POA: Insufficient documentation

## 2021-07-03 DIAGNOSIS — R0683 Snoring: Secondary | ICD-10-CM | POA: Diagnosis not present

## 2021-07-03 DIAGNOSIS — M5442 Lumbago with sciatica, left side: Secondary | ICD-10-CM | POA: Insufficient documentation

## 2021-07-03 DIAGNOSIS — E559 Vitamin D deficiency, unspecified: Secondary | ICD-10-CM

## 2021-07-03 DIAGNOSIS — Z1231 Encounter for screening mammogram for malignant neoplasm of breast: Secondary | ICD-10-CM

## 2021-07-03 DIAGNOSIS — Z0000 Encounter for general adult medical examination without abnormal findings: Secondary | ICD-10-CM | POA: Diagnosis not present

## 2021-07-03 DIAGNOSIS — R7302 Impaired glucose tolerance (oral): Secondary | ICD-10-CM

## 2021-07-03 DIAGNOSIS — I10 Essential (primary) hypertension: Secondary | ICD-10-CM

## 2021-07-03 DIAGNOSIS — E785 Hyperlipidemia, unspecified: Secondary | ICD-10-CM

## 2021-07-03 MED ORDER — PROMETHAZINE-DM 6.25-15 MG/5ML PO SYRP: ORAL_SOLUTION | ORAL | 0 refills | Status: DC

## 2021-07-03 MED ORDER — IBUPROFEN 800 MG PO TABS: 800.0000 mg | ORAL_TABLET | Freq: Three times a day (TID) | ORAL | 0 refills | Status: DC | PRN

## 2021-07-03 MED ORDER — CHLORPHENIRAMINE MALEATE 4 MG PO TABS: 4.0000 mg | ORAL_TABLET | Freq: Two times a day (BID) | ORAL | 1 refills | Status: DC | PRN

## 2021-07-03 MED ORDER — BENZONATATE 100 MG PO CAPS: 100.0000 mg | ORAL_CAPSULE | Freq: Two times a day (BID) | ORAL | 0 refills | Status: DC | PRN

## 2021-07-03 MED ORDER — ERGOCALCIFEROL 1.25 MG (50000 UT) PO CAPS: 50000.0000 [IU] | ORAL_CAPSULE | ORAL | 2 refills | Status: DC

## 2021-07-03 MED ORDER — LORATADINE 10 MG PO TABS: 10.0000 mg | ORAL_TABLET | Freq: Every day | ORAL | 11 refills | Status: DC

## 2021-07-03 NOTE — Patient Instructions (Addendum)
F/u in 6 month, call if you need me before  Annual exam with pap in 1 year  X ray of low back today, and ibuprofen  is prescribed for back and left thigh pain  Please schedule March mammogram at checkout  Please schedule appt with [facility] to evaluate for OSA at checkout   Loratidine and tessalon perles prescribed , ao chlorpheniramine and phenergan DM  Vit D weekly is prescribed for 9 months    It is important that you exercise regularly at least 30 minutes 5 times a week. If you develop chest pain, have severe difficulty breathing, or feel very tired, stop exercising immediately and seek medical attention   Change or stop snacks and increase vegetable intake and water   Goal sleep is 6 hr at least / day  cBC,  fasting lipid, cmp and EGFR, tSh, vit D and hBA1C 5 days before next visit  Thanks for choosing [facility], we consider it a privelige to serve you.

## 2021-07-03 NOTE — Assessment & Plan Note (Signed)
Increased and uncontrolled, tessalon perles, chlorpheniramine, Flonase, Claritin and phenergan dm

## 2021-07-03 NOTE — Assessment & Plan Note (Signed)
2 month history, x ray lumbar and short course of ibuprofen

## 2021-07-03 NOTE — Assessment & Plan Note (Signed)

## 2021-07-03 NOTE — Progress Notes (Signed)
    Grace Valdez     MRN: [id number]      DOB: April 24, 1966  HPI: Patient is in for annual physical exam. 2 month h/o ; left buttock and thigh pain awakewns her a t night on avg twice per week. Poor sleep, snoring and fatigue C/o increased cough and congestion x 1 week, no fever wEight management discussed Recent labs,  are reviewed.refuses vaccines updated if needed.   PE: BP 125/70   Pulse 78   Resp 18   Ht 5\' 5"  (1.651 m)   Wt 198 lb 1.9 oz (89.9 kg)   LMP 01/24/2013   SpO2 94%   BMI 32.97 kg/m   Pleasant  female, alert and oriented x 3, in no cardio-pulmonary distress. Afebrile. HEENT No facial trauma or asymetry. Sinuses non tender.  Extra occullar muscles intact.. External ears normal, . Neck: supple, no adenopathy,JVD or thyromegaly.No bruits.  Chest: Clear to ascultation bilaterally.No crackles or wheezes. Non tender to palpation  Breast: No asymetry,no masses or lumps. No tenderness. No nipple discharge or inversion. No axillary or supraclavicular adenopathy  Cardiovascular system; Heart sounds normal,  S1 and  S2 ,no S3.  No murmur, or thrill. Apical beat not displaced Peripheral pulses normal.  Abdomen: Soft, non tender,   Musculoskeletal exam: Full ROM of spine, hips , shoulders and knees. No deformity ,swelling or crepitus noted. No muscle wasting or atrophy.   Neurologic: Cranial nerves 2 to 12 intact. Power, tone ,sensation and reflexes normal throughout. No disturbance in gait. No tremor.  Skin: Intact, no ulceration, erythema , scaling or rash noted. Pigmentation normal throughout  Psych; Normal mood and affect. Judgement and concentration normal   Assessment & Plan:  Annual physical exam Annual exam as documented. Counseling done  re healthy lifestyle involving commitment to 150 minutes exercise per week, heart healthy diet, and attaining healthy weight.The importance of adequate sleep also discussed. Regular seat belt use and  home safety, is also discussed. Changes in health habits are decided on by the patient with goals and time frames  set for achieving them. Immunization and cancer screening needs are specifically addressed at this visit.   Habitual snoring Refer for sleep study  Low back pain with left-sided sciatica 2 month history, x ray lumbar and short course of ibuprofen  Seasonal allergies Increased and uncontrolled, tessalon perles, chlorpheniramine, Flonase, Claritin and phenergan dm  Obesity (BMI 30.0-34.9)  Patient re-educated about  the importance of commitment to a  minimum of 150 minutes of exercise per week as able.  The importance of healthy food choices with portion control discussed, as well as eating regularly and within a 12 hour window most days. The need to choose "clean , green" food 50 to 75% of the time is discussed, as well as to make water the primary drink and set a goal of 64 ounces water daily.    Weight /BMI 07/03/2021 01/01/2021 11/19/2020  WEIGHT 198 lb 1.9 oz 197 lb 185 lb  HEIGHT 5\' 5"  5\' 5"  5\' 5"   BMI 32.97 kg/m2 32.78 kg/m2 30.79 kg/m2

## 2021-07-03 NOTE — Assessment & Plan Note (Signed)
Refer for sleep study

## 2021-07-03 NOTE — Assessment & Plan Note (Signed)
  Patient re-educated about  the importance of commitment to a  minimum of 150 minutes of exercise per week as able.  The importance of healthy food choices with portion control discussed, as well as eating regularly and within a 12 hour window most days. The need to choose "clean , green" food 50 to 75% of the time is discussed, as well as to make water the primary drink and set a goal of 64 ounces water daily.    Weight /BMI 07/03/2021 01/01/2021 11/19/2020  WEIGHT 198 lb 1.9 oz 197 lb 185 lb  HEIGHT 5\' 5"  5\' 5"  5\' 5"   BMI 32.97 kg/m2 32.78 kg/m2 30.79 kg/m2

## 2021-07-04 NOTE — Progress Notes (Signed)
Called pt with results she stated understanding

## 2021-08-06 ENCOUNTER — Other Ambulatory Visit: Payer: Self-pay | Admitting: Family Medicine

## 2021-09-04 ENCOUNTER — Institutional Professional Consult (permissible substitution): Payer: 59 | Admitting: Pulmonary Disease

## 2021-11-12 ENCOUNTER — Ambulatory Visit: Payer: 59 | Admitting: Pulmonary Disease

## 2021-11-12 ENCOUNTER — Other Ambulatory Visit: Payer: Self-pay

## 2021-11-12 ENCOUNTER — Encounter: Payer: Self-pay | Admitting: Pulmonary Disease

## 2021-11-12 VITALS — BP 138/82 | HR 84 | Temp 98.4°F | Ht 65.0 in | Wt 196.4 lb

## 2021-11-12 DIAGNOSIS — R0683 Snoring: Secondary | ICD-10-CM

## 2021-11-12 DIAGNOSIS — J3089 Other allergic rhinitis: Secondary | ICD-10-CM

## 2021-11-12 DIAGNOSIS — J351 Hypertrophy of tonsils: Secondary | ICD-10-CM | POA: Diagnosis not present

## 2021-11-12 NOTE — Patient Instructions (Signed)
Will arrange for home sleep study Will call to arrange for follow up after sleep study reviewed  

## 2021-11-12 NOTE — Progress Notes (Signed)
? ?[facility] ? ?Chief Complaint  ?Patient presents with  ? Consult  ?  Ref by Dr. Moshe Cipro for snoring   ? ? ?Past Surgical History:  ?She  has a past surgical history that includes Cesarean section (1992); Colonoscopy with propofol (N/A, 11/19/2020); and polypectomy (11/19/2020). ? ?Past Medical History:  ?HTN, Allergies ? ?Constitutional:  ?BP 138/82 (BP Location: Left Arm, Patient Position: Sitting)   Pulse 84   Temp 98.4 ?F (36.9 ?C)   Ht 5\' 5"  (1.651 m)   Wt 196 lb 6.4 oz (89.1 kg)   LMP 01/24/2013   SpO2 98%   BMI 32.68 kg/m?  ? ?Brief Summary:  ?Grace Valdez is a 56 y.o. female with snoring. ?  ? ? ? ?Subjective:  ? ?She was seen recently by her PCP. Noted to have snoring and daytime fatigue.  She is a restless sleeper.  She can fall asleep in the evening when watching TV. ? ?She goes to sleep at 11 pm.  She falls asleep after a while.  She wakes up a couple of times to use the bathroom.  She gets out of bed at 7 am at the latest.  She feels okay in the morning.  She denies morning headache.  She does not use anything to help her fall sleep or stay awake. ? ?She denies sleep walking, sleep talking, bruxism, or nightmares.  There is no history of restless legs.  She denies sleep hallucinations, sleep paralysis, or cataplexy. ? ?The Epworth score is 4 out of 24. ? ? ?Physical Exam:  ? ?Appearance - well kempt  ? ?ENMT - no sinus tenderness, no oral exudate, no LAN, Mallampati 3 airway, no stridor, 3+ tonsils ? ?Respiratory - equal breath sounds bilaterally, no wheezing or rales ? ?CV - s1s2 regular rate and rhythm, no murmurs ? ?Ext - no clubbing, no edema ? ?Skin - no rashes ? ?Psych - normal mood and affect ?  ?Sleep Tests:  ? ? ?Social History:  ?She  reports that she has quit smoking. She has never used smokeless tobacco. She reports current alcohol use. She reports that she does not use drugs. ? ?Family History:  ?Her family history is not on file. ?   ? ?Discussion:  ?She has snoring, sleep disruption, apnea, and daytime sleepiness.  She has history of hypertension.  She has seasonal allergies and tonsillar hypertrophy.  I am concerned she could have obstructive sleep apnea. ? ?Assessment/Plan:  ? ?Snoring with excessive daytime sleepiness. ?- will need to arrange for a home sleep study; she will contact her insurance provider to determine out of pocket expense before deciding whether she would want to schedule study ? ?Tonsillar hypertrophy with perennial allergic rhinitis. ?- explained how this could impact snoring and sleep disordered breathing ?- she is reluctant to add medications at this time ?- continue claritin and flonase ? ?Obesity. ?- discussed how weight can impact sleep and risk for sleep disordered breathing ?- discussed options to assist with weight loss: combination of diet modification, cardiovascular and strength training exercises ? ?Cardiovascular risk. ?- had an extensive discussion regarding the adverse health consequences related to untreated sleep disordered breathing ?- specifically discussed the risks for hypertension, coronary artery disease, cardiac dysrhythmias, cerebrovascular disease, and diabetes ?- lifestyle modification discussed ? ?Safe driving practices. ?- discussed how sleep disruption can increase risk of accidents, particularly when driving ?- safe driving practices were discussed ? ?Therapies for obstructive sleep apnea. ?- if  the sleep study shows significant sleep apnea, then various therapies for treatment were reviewed: CPAP, oral appliance, and surgical interventions ? ?Time Spent Involved in Patient Care on Day of Examination:  ?35 minutes ? ?Follow up:  ? ?Patient Instructions  ?Will arrange for home sleep study ?Will call to arrange for follow up after sleep study reviewed ? ? ?Medication List:  ? ?Allergies as of 11/12/2021   ? ?   Reactions  ? Phentermine Nausea And Vomiting  ? ?  ? ?  ?Medication List  ?  ? ?  ?  Accurate as of November 12, 2021  9:25 AM. If you have any questions, ask your nurse or doctor.  ?  ?  ? ?  ? ?STOP taking these medications   ? ?benzonatate 100 MG capsule ?Commonly known as: TESSALON ?Stopped by: Chesley Mires, MD ?  ?promethazine-dextromethorphan 6.25-15 MG/5ML syrup ?Commonly known as: PROMETHAZINE-DM ?Stopped by: Chesley Mires, MD ?  ? ?  ? ?TAKE these medications   ? ?BC Fast Pain Relief 650-195-33.3 MG Pack ?Generic drug: Aspirin-Salicylamide-Caffeine ?Take 1 packet by mouth 2 (two) times daily as needed (pain.). ?  ?CALCIUM + D3 PO ?Take 1 tablet by mouth 2 (two) times a week. ?  ?chlorpheniramine 4 MG tablet ?Commonly known as: CHLOR-TRIMETON ?Take 1 tablet (4 mg total) by mouth 2 (two) times daily as needed for allergies. ?  ?ergocalciferol 1.25 MG (50000 UT) capsule ?Commonly known as: VITAMIN D2 ?Take 1 capsule (50,000 Units total) by mouth once a week. One capsule once weekly ?  ?fluticasone 50 MCG/ACT nasal spray ?Commonly known as: Flonase ?Place 2 sprays into both nostrils daily. ?  ?ibuprofen 800 MG tablet ?Commonly known as: ADVIL ?Take 1 tablet (800 mg total) by mouth every 8 (eight) hours as needed. ?  ?loratadine 10 MG tablet ?Commonly known as: CLARITIN ?Take 1 tablet (10 mg total) by mouth daily. ?  ?olopatadine 0.1 % ophthalmic solution ?Commonly known as: Patanol ?Place 1 drop into both eyes 2 (two) times daily. ?  ?triamterene-hydrochlorothiazide 37.5-25 MG tablet ?Commonly known as: MAXZIDE-25 ?Take 1 tablet by mouth once daily ?  ? ?  ? ? ?Signature:  ?Chesley Mires, MD ?[place] ?Pager - [phone] ?11/12/2021, 9:25 AM ?  ? ? ? ? ? ? ? ?  ?

## 2021-11-17 ENCOUNTER — Ambulatory Visit (HOSPITAL_COMMUNITY)
Admission: RE | Admit: 2021-11-17 | Discharge: 2021-11-17 | Disposition: A | Discharge status: Home / Self Care | Payer: 59 | Source: Ambulatory Visit | Attending: Family Medicine | Admitting: Family Medicine

## 2021-11-17 ENCOUNTER — Other Ambulatory Visit: Payer: Self-pay

## 2021-11-17 DIAGNOSIS — Z1231 Encounter for screening mammogram for malignant neoplasm of breast: Secondary | ICD-10-CM | POA: Insufficient documentation

## 2021-12-11 ENCOUNTER — Other Ambulatory Visit: Payer: Self-pay | Admitting: Family Medicine

## 2022-01-09 ENCOUNTER — Ambulatory Visit: Payer: 59 | Admitting: Family Medicine

## 2022-01-14 LAB — LIPID PANEL
Chol/HDL Ratio: 3.7 ratio (ref 0.0–4.4)
Cholesterol, Total: 231 mg/dL — ABNORMAL HIGH (ref 100–199)
HDL: 63 mg/dL (ref >39)
LDL Chol Calc (NIH): 150 mg/dL — ABNORMAL HIGH (ref 0–99)
Triglycerides: 101 mg/dL (ref 0–149)
VLDL Cholesterol Cal: 18 mg/dL (ref 5–40)

## 2022-01-14 LAB — CMP14+EGFR
ALT: 12 IU/L (ref 0–32)
AST: 15 IU/L (ref 0–40)
Albumin/Globulin Ratio: 1.7 (ref 1.2–2.2)
Albumin: 4.6 g/dL (ref 3.8–4.9)
Alkaline Phosphatase: 70 IU/L (ref 44–121)
BUN/Creatinine Ratio: 18 (ref 9–23)
BUN: 17 mg/dL (ref 6–24)
Bilirubin Total: 0.3 mg/dL (ref 0.0–1.2)
CO2: 25 mmol/L (ref 20–29)
Calcium: 9.6 mg/dL (ref 8.7–10.2)
Chloride: 102 mmol/L (ref 96–106)
Creatinine, Ser: 0.97 mg/dL (ref 0.57–1.00)
Globulin, Total: 2.7 g/dL (ref 1.5–4.5)
Glucose: 113 mg/dL — ABNORMAL HIGH (ref 70–99)
Potassium: 4.3 mmol/L (ref 3.5–5.2)
Sodium: 141 mmol/L (ref 134–144)
Total Protein: 7.3 g/dL (ref 6.0–8.5)
eGFR: 68 mL/min/{1.73_m2} (ref >59)

## 2022-01-14 LAB — CBC
Hematocrit: 38.2 % (ref 34.0–46.6)
Hemoglobin: 12.9 g/dL (ref 11.1–15.9)
MCH: 30.5 pg (ref 26.6–33.0)
MCHC: 33.8 g/dL (ref 31.5–35.7)
MCV: 90 fL (ref 79–97)
Platelets: 359 10*3/uL (ref 150–450)
RBC: 4.23 x10E6/uL (ref 3.77–5.28)
RDW: 12.9 % (ref 11.7–15.4)
WBC: 7.5 10*3/uL (ref 3.4–10.8)

## 2022-01-14 LAB — TSH: TSH: 3.55 u[IU]/mL (ref 0.450–4.500)

## 2022-01-14 LAB — VITAMIN D 25 HYDROXY (VIT D DEFICIENCY, FRACTURES): Vit D, 25-Hydroxy: 70.3 ng/mL (ref 30.0–100.0)

## 2022-01-14 LAB — HEMOGLOBIN A1C
Est. average glucose Bld gHb Est-mCnc: 131 mg/dL
Hgb A1c MFr Bld: 6.2 % — ABNORMAL HIGH (ref 4.8–5.6)

## 2022-01-21 ENCOUNTER — Ambulatory Visit: Payer: 59 | Admitting: Family Medicine

## 2022-01-21 ENCOUNTER — Encounter: Payer: Self-pay | Admitting: Family Medicine

## 2022-01-21 VITALS — BP 109/61 | HR 76 | Ht 65.0 in | Wt 197.0 lb

## 2022-01-21 DIAGNOSIS — I10 Essential (primary) hypertension: Secondary | ICD-10-CM | POA: Diagnosis not present

## 2022-01-21 DIAGNOSIS — R7302 Impaired glucose tolerance (oral): Secondary | ICD-10-CM | POA: Diagnosis not present

## 2022-01-21 DIAGNOSIS — E669 Obesity, unspecified: Secondary | ICD-10-CM | POA: Diagnosis not present

## 2022-01-21 DIAGNOSIS — E785 Hyperlipidemia, unspecified: Secondary | ICD-10-CM

## 2022-01-21 NOTE — Patient Instructions (Signed)
Annual exam in 5 months, call if you need me sooner ? ?Lifestyle change with diet and exercise, as discussed ? ?Cholesterol and bloodsugar have increased so need to change ? ?Eat between 7 am and 7 pm ? ?Schedule eating 2 to 3 times per day and if you must snack, fresh vegetable or fruit ? ?75 % of what you eat should be vegetables or fruits ? ?Eat protein daily, white meat preferred , grilled or baked ? ?64 ounces water daily ? ?It is important that you exercise regularly at least 30 minutes 5 times a week. If you develop chest pain, have severe difficulty breathing, or feel very tired, stop exercising immediately and seek medical attentionsyop weekly vit D and start daily OTC 1000 IU when done ? ?Fasting lipid, cmp and EGR and HBA1C 5 days before follow up ? ? Reconsider he vaccines which you need please ? ?Remember all the good reports that you have ?

## 2022-01-25 ENCOUNTER — Encounter: Payer: Self-pay | Admitting: Family Medicine

## 2022-01-25 NOTE — Assessment & Plan Note (Signed)
Hyperlipidemia:Low fat diet discussed and encouraged. ? ? ?Lipid Panel  ?Lab Results  ?Component Value Date  ? CHOL 231 (H) 01/13/2022  ? HDL 63 01/13/2022  ? LDLCALC 150 (H) 01/13/2022  ? TRIG 101 01/13/2022  ? CHOLHDL 3.7 01/13/2022  ? ? ? ?Needs to reduce fried and fatty foods ?

## 2022-01-25 NOTE — Progress Notes (Signed)
? ?Grace Valdez     MRN: [id number]      DOB: 1966-01-23 ? ? ?HPI ?Ms. Baade is here for follow up and re-evaluation of chronic medical conditions, medication management and review of any available recent lab and radiology data.  ?Preventive health is updated, specifically  Cancer screening and Immunization.   ?Questions or concerns regarding consultations or procedures which the PT has had in the interim are  addressed. ?The PT denies any adverse reactions to current medications since the last visit.  ?There are no new concerns.  ?There are no specific complaints  ? ?ROS ?Denies recent fever or chills. ?Denies sinus pressure, nasal congestion, ear pain or sore throat. ?Denies chest congestion, productive cough or wheezing. ?Denies chest pains, palpitations and leg swelling ?Denies abdominal pain, nausea, vomiting,diarrhea or constipation.   ?Denies dysuria, frequency, hesitancy or incontinence. ?Denies joint pain, swelling and limitation in mobility. ?Denies headaches, seizures, numbness, or tingling. ?Denies depression, anxiety or insomnia. ?Denies skin break down or rash. ? ? ?PE ? ?BP 109/61   Pulse 76   Ht '5\' 5"'$  (1.651 m)   Wt 197 lb 0.6 oz (89.4 kg)   LMP 01/24/2013   SpO2 95%   BMI 32.79 kg/m?  ? ?Patient alert and oriented and in no cardiopulmonary distress. ? ?HEENT: No facial asymmetry, EOMI,     Neck supple . ? ?Chest: Clear to auscultation bilaterally. ? ?CVS: S1, S2 no murmurs, no S3.Regular rate. ? ?ABD: Soft non tender.  ? ?Ext: No edema ? ?MS: Adequate ROM spine, shoulders, hips and knees. ? ?Skin: Intact, no ulcerations or rash noted. ? ?Psych: Good eye contact, normal affect. Memory intact not anxious or depressed appearing. ? ?CNS: CN 2-12 intact, power,  normal throughout.no focal deficits noted. ? ? ?Assessment & Plan ? ?Essential hypertension ?Controlled, no change in medication ?DASH diet and commitment to daily physical activity for a minimum of 30 minutes discussed and encouraged, as a  part of hypertension management. ?The importance of attaining a healthy weight is also discussed. ? ? ?  01/21/2022  ? 11:26 AM 11/12/2021  ?  9:02 AM 07/03/2021  ?  8:05 AM 01/01/2021  ?  9:37 AM 11/19/2020  ?  7:55 AM 11/19/2020  ?  6:51 AM 05/08/2020  ?  9:23 AM  ?BP/Weight  ?Systolic BP 474 259 563 875 96 153 113  ?Diastolic BP 61 82 70 77 61 79 70  ?Wt. (Lbs) 197.04 196.4 198.12 197  185 196  ?BMI 32.79 kg/m2 32.68 kg/m2 32.97 kg/m2 32.78 kg/m2  30.79 kg/m2 32.62 kg/m2  ? ? ? ? ? ?Dyslipidemia, goal LDL below 100 ?Hyperlipidemia:Low fat diet discussed and encouraged. ? ? ?Lipid Panel  ?Lab Results  ?Component Value Date  ? CHOL 231 (H) 01/13/2022  ? HDL 63 01/13/2022  ? LDLCALC 150 (H) 01/13/2022  ? TRIG 101 01/13/2022  ? CHOLHDL 3.7 01/13/2022  ? ? ? ?Needs to reduce fried and fatty foods ? ?Obesity (BMI 30.0-34.9) ? ?Patient re-educated about  the importance of commitment to a  minimum of 150 minutes of exercise per week as able. ? ?The importance of healthy food choices with portion control discussed, as well as eating regularly and within a 12 hour window most days. ?The need to choose "clean , green" food 50 to 75% of the time is discussed, as well as to make water the primary drink and set a goal of 64 ounces water daily. ? ?  ? ?  01/21/2022  ?  11:26 AM 11/12/2021  ?  9:02 AM 07/03/2021  ?  8:05 AM  ?Weight /BMI  ?Weight 197 lb 0.6 oz 196 lb 6.4 oz 198 lb 1.9 oz  ?Height '5\' 5"'$  (1.651 m) '5\' 5"'$  (1.651 m) '5\' 5"'$  (1.651 m)  ?BMI 32.79 kg/m2 32.68 kg/m2 32.97 kg/m2  ? ? ? ? ?

## 2022-01-25 NOTE — Assessment & Plan Note (Signed)
Controlled, no change in medication ?DASH diet and commitment to daily physical activity for a minimum of 30 minutes discussed and encouraged, as a part of hypertension management. ?The importance of attaining a healthy weight is also discussed. ? ? ?  01/21/2022  ? 11:26 AM 11/12/2021  ?  9:02 AM 07/03/2021  ?  8:05 AM 01/01/2021  ?  9:37 AM 11/19/2020  ?  7:55 AM 11/19/2020  ?  6:51 AM 05/08/2020  ?  9:23 AM  ?BP/Weight  ?Systolic BP 010 272 536 644 96 153 113  ?Diastolic BP 61 82 70 77 61 79 70  ?Wt. (Lbs) 197.04 196.4 198.12 197  185 196  ?BMI 32.79 kg/m2 32.68 kg/m2 32.97 kg/m2 32.78 kg/m2  30.79 kg/m2 32.62 kg/m2  ? ? ? ? ?

## 2022-01-25 NOTE — Assessment & Plan Note (Signed)
?  Patient re-educated about  the importance of commitment to a  minimum of 150 minutes of exercise per week as able. ? ?The importance of healthy food choices with portion control discussed, as well as eating regularly and within a 12 hour window most days. ?The need to choose "clean , green" food 50 to 75% of the time is discussed, as well as to make water the primary drink and set a goal of 64 ounces water daily. ? ?  ? ?  01/21/2022  ? 11:26 AM 11/12/2021  ?  9:02 AM 07/03/2021  ?  8:05 AM  ?Weight /BMI  ?Weight 197 lb 0.6 oz 196 lb 6.4 oz 198 lb 1.9 oz  ?Height '5\' 5"'$  (1.651 m) '5\' 5"'$  (1.651 m) '5\' 5"'$  (1.651 m)  ?BMI 32.79 kg/m2 32.68 kg/m2 32.97 kg/m2  ? ? ? ?

## 2022-02-04 IMAGING — US US BREAST*R* LIMITED INC AXILLA
1 series · 6 of 6 positions shown · non-contrast
Comparison: Previous exams.

CLINICAL DATA: Screening recall for right breast asymmetries.

EXAM:
DIGITAL DIAGNOSTIC UNILATERAL RIGHT MAMMOGRAM WITH TOMOSYNTHESIS AND
CAD; ULTRASOUND RIGHT BREAST LIMITED
TECHNIQUE: Right digital diagnostic mammography and breast tomosynthesis was
performed. The images were evaluated with computer-aided detection.;
Targeted ultrasound examination of the right breast was performed

[Series 1: us breast*right* limited inc axilla · 0.07mm/px · 6 of 6 slices shown]
[im 1/6]
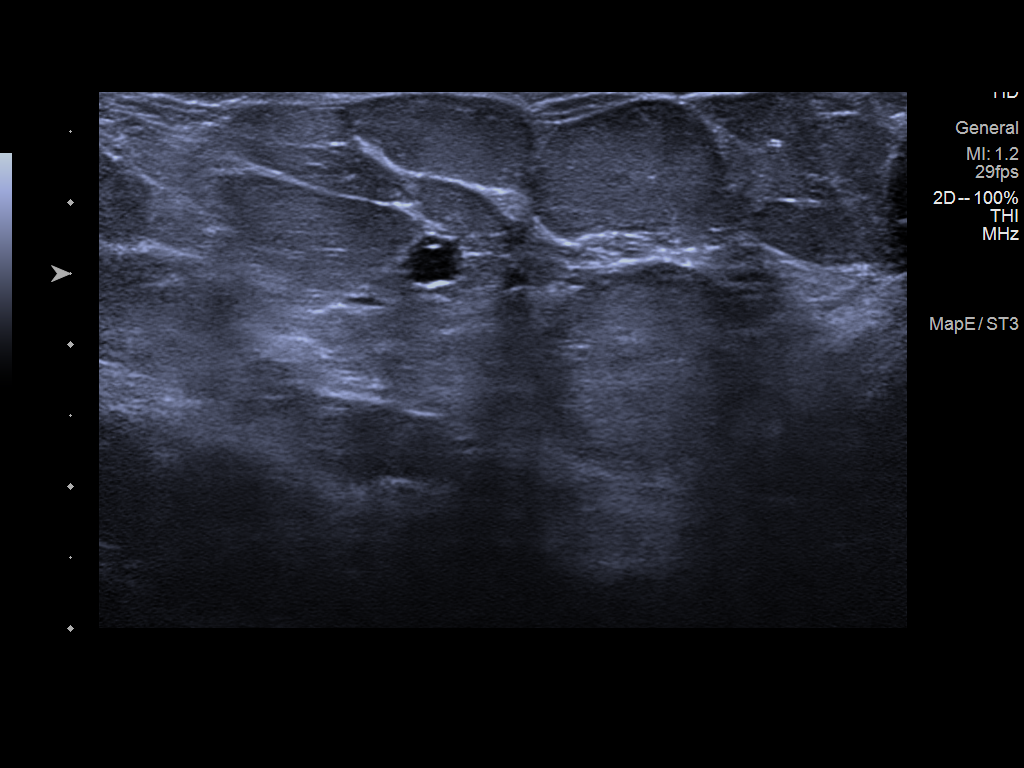
[im 2/6]
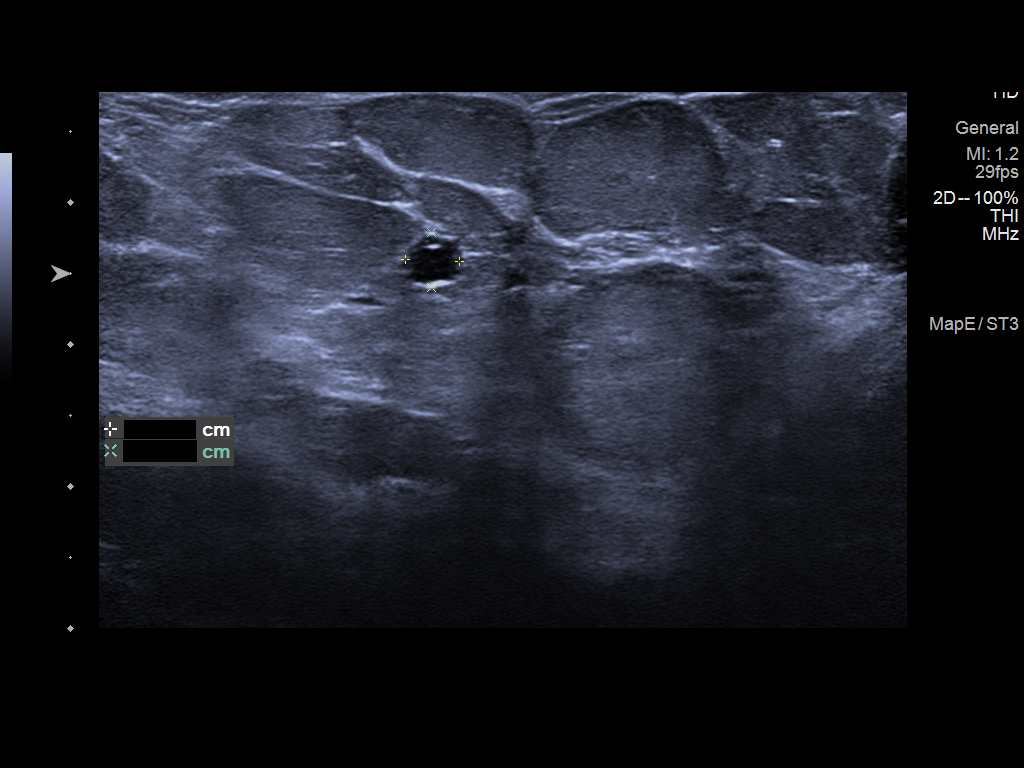
[im 3/6]
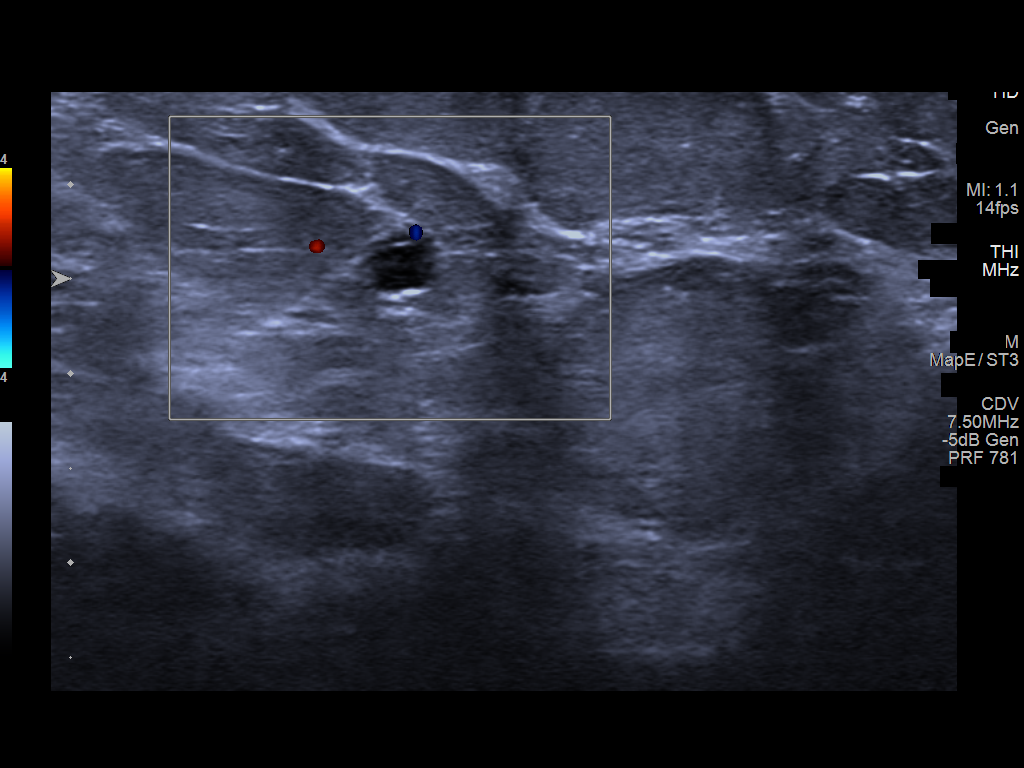
[im 4/6]
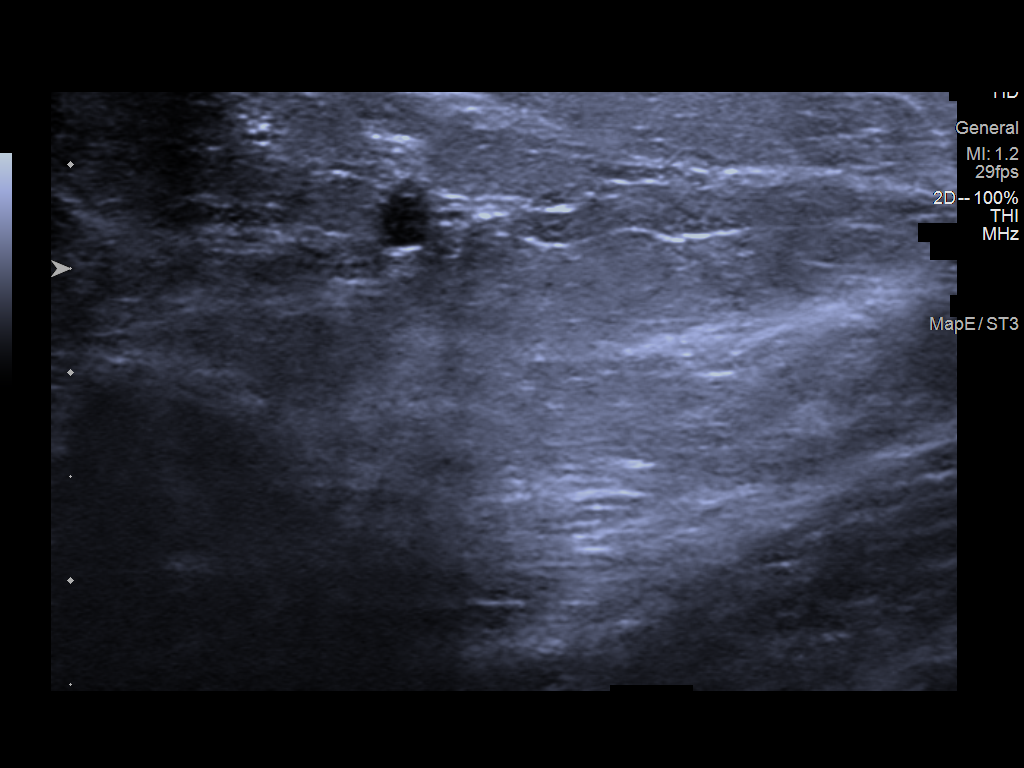
[im 5/6]
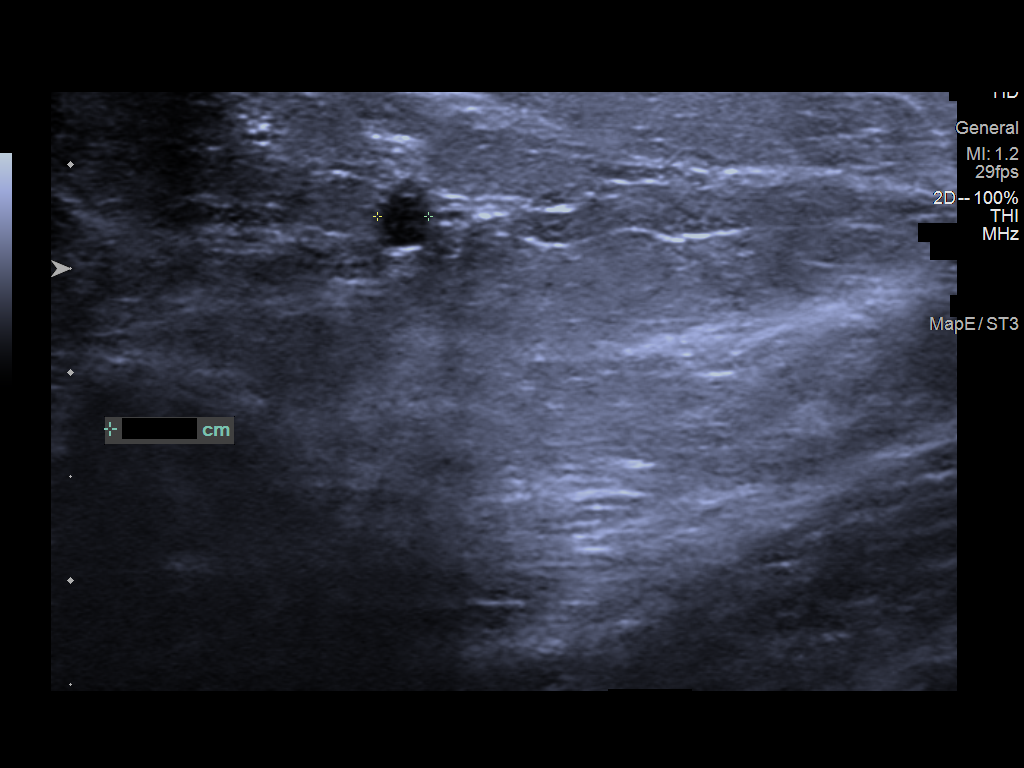
[im 6/6]
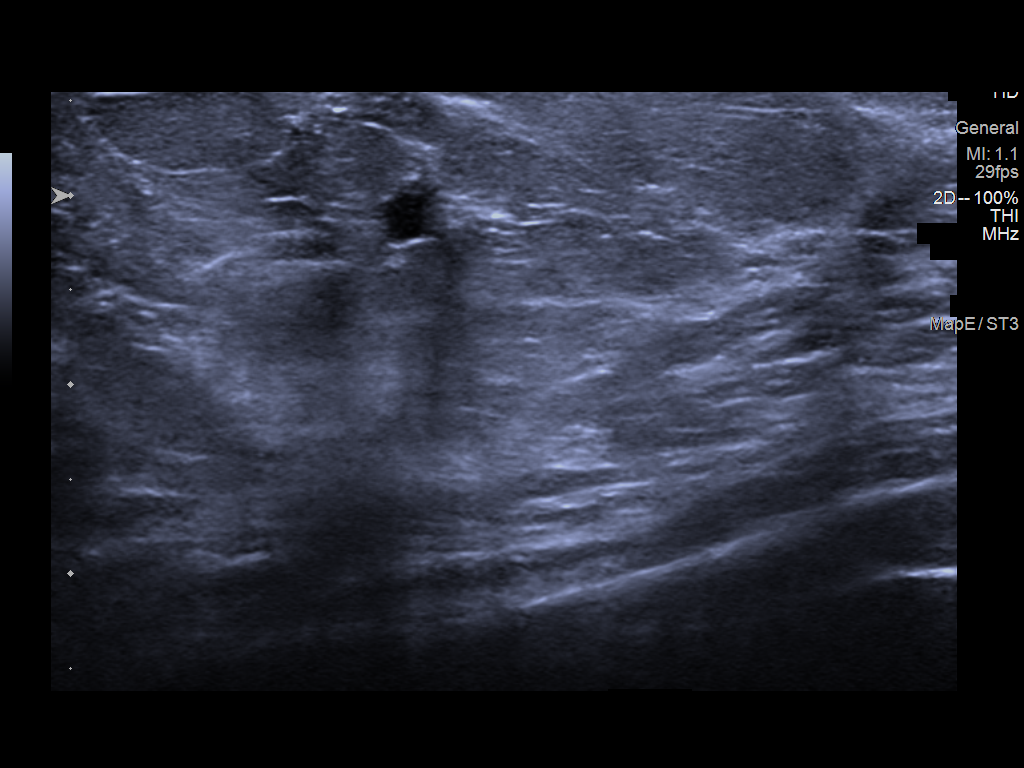

[6 of 6 positions shown; findings below may reference images not displayed]

ACR Breast Density Category b: There are scattered areas of
fibroglandular density.
FINDINGS: Spot compression tomograms were performed of the right breast. There
is an oval circumscribed mass in the slightly inner right breast
measuring 0.3 cm, possibly a vessel or portion of a duct.

Targeted ultrasound of the inner right breast was performed. There
is a cyst at 3 o'clock 1 cm from nipple measuring 0.4 x 0.4 x
cm. This corresponds well with the mass seen in the inner right
breast at mammography. No suspicious masses or abnormality seen in
the inner right breast.
IMPRESSION: No findings of malignancy in the right breast.

RECOMMENDATION:
Screening mammogram in one year.(Code:LQ-A-BUX)

I have discussed the findings and recommendations with the patient.
If applicable, a reminder letter will be sent to the patient
regarding the next appointment.

BI-RADS CATEGORY  2: Benign.

## 2022-03-05 ENCOUNTER — Other Ambulatory Visit: Payer: Self-pay | Admitting: Family Medicine

## 2022-07-02 LAB — CMP14+EGFR
ALT: 8 IU/L (ref 0–32)
AST: 18 IU/L (ref 0–40)
Albumin/Globulin Ratio: 1.5 (ref 1.2–2.2)
Albumin: 4.4 g/dL (ref 3.8–4.9)
Alkaline Phosphatase: 63 IU/L (ref 44–121)
BUN/Creatinine Ratio: 19 (ref 9–23)
BUN: 20 mg/dL (ref 6–24)
Bilirubin Total: 0.4 mg/dL (ref 0.0–1.2)
CO2: 25 mmol/L (ref 20–29)
Calcium: 9.4 mg/dL (ref 8.7–10.2)
Chloride: 101 mmol/L (ref 96–106)
Creatinine, Ser: 1.07 mg/dL — ABNORMAL HIGH (ref 0.57–1.00)
Globulin, Total: 3 g/dL (ref 1.5–4.5)
Glucose: 118 mg/dL — ABNORMAL HIGH (ref 70–99)
Potassium: 3.8 mmol/L (ref 3.5–5.2)
Sodium: 138 mmol/L (ref 134–144)
Total Protein: 7.4 g/dL (ref 6.0–8.5)
eGFR: 60 mL/min/{1.73_m2} (ref >59)

## 2022-07-02 LAB — HEMOGLOBIN A1C
Est. average glucose Bld gHb Est-mCnc: 134 mg/dL
Hgb A1c MFr Bld: 6.3 % — ABNORMAL HIGH (ref 4.8–5.6)

## 2022-07-02 LAB — LIPID PANEL
Chol/HDL Ratio: 3.4 ratio (ref 0.0–4.4)
Cholesterol, Total: 222 mg/dL — ABNORMAL HIGH (ref 100–199)
HDL: 65 mg/dL (ref >39)
LDL Chol Calc (NIH): 140 mg/dL — ABNORMAL HIGH (ref 0–99)
Triglycerides: 95 mg/dL (ref 0–149)
VLDL Cholesterol Cal: 17 mg/dL (ref 5–40)

## 2022-07-07 ENCOUNTER — Other Ambulatory Visit: Payer: Self-pay

## 2022-07-07 ENCOUNTER — Ambulatory Visit (INDEPENDENT_AMBULATORY_CARE_PROVIDER_SITE_OTHER): Payer: 59 | Admitting: Family Medicine

## 2022-07-07 ENCOUNTER — Encounter: Payer: Self-pay | Admitting: Family Medicine

## 2022-07-07 ENCOUNTER — Other Ambulatory Visit (HOSPITAL_COMMUNITY)
Admission: RE | Admit: 2022-07-07 | Discharge: 2022-07-07 | Disposition: A | Discharge status: Home / Self Care | Payer: 59 | Source: Ambulatory Visit | Attending: Family Medicine | Admitting: Family Medicine

## 2022-07-07 VITALS — BP 120/73 | HR 86 | Ht 65.0 in | Wt 194.1 lb

## 2022-07-07 DIAGNOSIS — Z113 Encounter for screening for infections with a predominantly sexual mode of transmission: Secondary | ICD-10-CM | POA: Diagnosis present

## 2022-07-07 DIAGNOSIS — Z0001 Encounter for general adult medical examination with abnormal findings: Secondary | ICD-10-CM | POA: Diagnosis not present

## 2022-07-07 DIAGNOSIS — Z124 Encounter for screening for malignant neoplasm of cervix: Secondary | ICD-10-CM | POA: Insufficient documentation

## 2022-07-07 DIAGNOSIS — Z23 Encounter for immunization: Secondary | ICD-10-CM | POA: Diagnosis not present

## 2022-07-07 DIAGNOSIS — R7302 Impaired glucose tolerance (oral): Secondary | ICD-10-CM

## 2022-07-07 DIAGNOSIS — I10 Essential (primary) hypertension: Secondary | ICD-10-CM

## 2022-07-07 DIAGNOSIS — E559 Vitamin D deficiency, unspecified: Secondary | ICD-10-CM

## 2022-07-07 DIAGNOSIS — Z0000 Encounter for general adult medical examination without abnormal findings: Secondary | ICD-10-CM

## 2022-07-07 DIAGNOSIS — E785 Hyperlipidemia, unspecified: Secondary | ICD-10-CM

## 2022-07-07 MED ORDER — PROMETHAZINE-DM 6.25-15 MG/5ML PO SYRP: ORAL_SOLUTION | ORAL | 0 refills | Status: DC

## 2022-07-07 MED ORDER — FLONASE SENSIMIST 27.5 MCG/SPRAY NA SUSP: 2.0000 | Freq: Every day | NASAL | 12 refills | Status: AC

## 2022-07-07 MED ORDER — TRIAMTERENE-HCTZ 37.5-25 MG PO TABS: 1.0000 | ORAL_TABLET | Freq: Every day | ORAL | 3 refills | Status: DC

## 2022-07-07 NOTE — Patient Instructions (Signed)
Follow-up in 6 months call if you need me sooner.  Fasting CBC, lipid, cmp and eGFR, hBA1c, TSH and vit D 7 to 10 days before next visit  Tdap in office today.    Please reconsider vaccines which do help to reduce disease burden and risk.  Continue to work on increasing vegetable and fruit intake make them approximately 60 to 70% of what you eat.  Reduce sweets and white starchy foods and continue to be active.  Your blood sugar has increased which is concerning work on this so you do not become diabetic.  Your cholesterol is slightly better.  Please commit to Caltrate with D 1 tablet twice daily for bone health.  Please work on increasing sleep hours minimum of 6 hours per night 80 minutes for 8 hours per night this is important for health  Thanks for choosing [facility], we consider it a privelige to serve you.

## 2022-07-07 NOTE — Progress Notes (Signed)
    Grace Valdez     MRN: [id number]      DOB: Jul 25, 1966  HPI: Patient is in for annual physical exam. No other health concerns are expressed or addressed at the visit.STD testing is requested Recent labs,  are reviewed. Immunization is reviewed , and  updated if needed.   PE: BP 120/73 (BP Location: Right Arm, Patient Position: Sitting, Cuff Size: Normal)   Pulse 86   Ht '5\' 5"'$  (1.651 m)   Wt 194 lb 1.9 oz (88.1 kg)   LMP 01/24/2013   SpO2 94%   BMI 32.30 kg/m  Pleasant  female, alert and oriented x 3, in no cardio-pulmonary distress. Afebrile. HEENT No facial trauma or asymetry. Sinuses non tender.  Extra occullar muscles intact.. External ears normal, . Neck: supple, no adenopathy,JVD or thyromegaly.No bruits.  Chest: Clear to ascultation bilaterally.No crackles or wheezes. Non tender to palpation  Breast: No asymetry,no masses or lumps. No tenderness. No nipple discharge or inversion. No axillary or supraclavicular adenopathy  Cardiovascular system; Heart sounds normal,  S1 and  S2 ,no S3.  No murmur, or thrill. Apical beat not displaced Peripheral pulses normal.  Abdomen: Soft, non tender, no organomegaly or masses. No bruits. Bowel sounds normal. No guarding, tenderness or rebound.   GU: External genitalia normal female genitalia , normal female distribution of hair. No lesions. Urethral meatus normal in size, no  Prolapse, no lesions visibly  Present. Bladder non tender. Vagina pink and moist , with no visible lesions , discharge present . Adequate pelvic support no  cystocele or rectocele noted Cervix pink and appears healthy, no lesions or ulcerations noted, no discharge noted from os Uterus normal size, no adnexal masses, no cervical motion or adnexal tenderness.   Musculoskeletal exam: Full ROM of spine, hips , shoulders and knees. No deformity ,swelling or crepitus noted. No muscle wasting or atrophy.   Neurologic: Cranial nerves 2 to 12  intact. Power, tone ,sensation and reflexes normal throughout. No disturbance in gait. No tremor.  Skin: Intact, no ulceration, erythema , scaling or rash noted. Pigmentation normal throughout  Psych; Normal mood and affect. Judgement and concentration normal   Assessment & Plan:  Annual physical exam Annual exam as documented. Counseling done  re healthy lifestyle involving commitment to 150 minutes exercise per week, heart healthy diet, and attaining healthy weight.The importance of adequate sleep also discussed. Regular seat belt use and home safety, is also discussed. Changes in health habits are decided on by the patient with goals and time frames  set for achieving them. Immunization and cancer screening needs are specifically addressed at this visit.   Screen for STD (sexually transmitted disease) Specimens sent  Need for Tdap vaccination After obtaining informed consent, the vaccine is  administered , with no adverse effect noted at the time of administration.

## 2022-07-07 NOTE — Assessment & Plan Note (Signed)
Specimens sent

## 2022-07-07 NOTE — Assessment & Plan Note (Signed)

## 2022-07-07 NOTE — Assessment & Plan Note (Signed)
After obtaining informed consent, the vaccine is  administered , with no adverse effect noted at the time of administration.  

## 2022-07-08 LAB — CERVICOVAGINAL ANCILLARY ONLY
Bacterial Vaginitis (gardnerella): POSITIVE — AB
Candida Glabrata: NEGATIVE
Candida Vaginitis: NEGATIVE
Chlamydia: NEGATIVE
Comment: NEGATIVE
Comment: NEGATIVE
Comment: NEGATIVE
Comment: NEGATIVE
Comment: NEGATIVE
Comment: NORMAL
Neisseria Gonorrhea: NEGATIVE
Trichomonas: NEGATIVE

## 2022-07-08 LAB — CYTOLOGY - PAP
Adequacy: ABSENT
Comment: NEGATIVE
Diagnosis: NEGATIVE
High risk HPV: NEGATIVE

## 2022-07-10 ENCOUNTER — Other Ambulatory Visit: Payer: Self-pay | Admitting: Family Medicine

## 2022-07-10 MED ORDER — METRONIDAZOLE 0.75 % VA GEL: 1.0000 | Freq: Two times a day (BID) | VAGINAL | 0 refills | Status: DC

## 2022-07-30 ENCOUNTER — Ambulatory Visit: Payer: 59 | Admitting: Internal Medicine

## 2022-07-30 ENCOUNTER — Encounter: Payer: Self-pay | Admitting: Internal Medicine

## 2022-07-30 DIAGNOSIS — R0989 Other specified symptoms and signs involving the circulatory and respiratory systems: Secondary | ICD-10-CM | POA: Diagnosis not present

## 2022-07-30 NOTE — Progress Notes (Signed)
   Acute Telephone Visit  Virtual Visit via Telephone Note  I connected with Grace Valdez on 07/30/22 at 11:40 AM EST by telephone and verified that I am speaking with the correct person using two identifiers.  Location: Patient: [address] [place], [place] [address] Provider: [phone] [address], [place], [place] [address]   I discussed the limitations, risks, security and privacy concerns of performing an evaluation and management service by telephone and the availability of in person appointments. I also discussed with the patient that there may be a patient responsible charge related to this service. The patient expressed understanding and agreed to proceed.  Chief Complaint  Patient presents with   Cough    Congestion, ear pain, sore throat since 07/28/2022. When blows nose, notices blood coming out.   History of Present Illness:  Grace Valdez is evaluated today through telephone encounter for upper respiratory symptoms.  She reports a 2-day history of nasal congestion with clear drainage.  She has noted some dried specks of blood when blowing her nose.  She also endorses bilateral ear discomfort and throat irritation.  She denies fever/chills and cough or sputum production.  Grace Valdez is not aware of any sick contacts.  She has been managing her symptoms with cough syrup and Claritin.  Assessment and Plan:  Upper Respiratory Symptoms Acute telephone encounter scheduled today for evaluation of a 2-day history of the symptoms noted above.  Her symptoms do not seem consistent with a bacterial infection, possibly seasonal allergies or viral etiology.  I recommended supportive care measures for now, including use of nasal saline rinse followed by fluticasone spray, continued use of daily antihistamine, nasal decongestant use, and Tylenol for pain.  I recommend that she stay well-hydrated.  I offered COVID/influenza testing with Thanksgiving approaching next week.  She declined at this time.  Grace Valdez  requested a prescription for an antibiotic, however I reviewed with her that her symptoms do not suggest a bacterial infection and an antibiotic is not warranted.  She will return to care if her symptoms do not improve, otherwise she has previously scheduled follow-up with Dr. Moshe Cipro in late December.   Follow Up Instructions:  I discussed the assessment and treatment plan with the patient. The patient was provided an opportunity to ask questions and all were answered. The patient agreed with the plan and demonstrated an understanding of the instructions.   The patient was advised to call back or seek an in-person evaluation if the symptoms worsen or if the condition fails to improve as anticipated.  I provided 11 minutes of non-face-to-face time during this encounter.   Johnette Abraham, MD

## 2022-07-31 ENCOUNTER — Telehealth: Payer: 59 | Admitting: Internal Medicine

## 2022-09-10 ENCOUNTER — Ambulatory Visit (HOSPITAL_COMMUNITY)
Admission: RE | Admit: 2022-09-10 | Discharge: 2022-09-10 | Disposition: A | Discharge status: Home / Self Care | Payer: 59 | Source: Ambulatory Visit | Attending: Family Medicine | Admitting: Family Medicine

## 2022-09-10 ENCOUNTER — Ambulatory Visit: Payer: 59 | Admitting: Family Medicine

## 2022-09-10 ENCOUNTER — Encounter: Payer: Self-pay | Admitting: Family Medicine

## 2022-09-10 VITALS — BP 125/82 | HR 84 | Ht 65.0 in | Wt 194.0 lb

## 2022-09-10 DIAGNOSIS — R0989 Other specified symptoms and signs involving the circulatory and respiratory systems: Secondary | ICD-10-CM | POA: Insufficient documentation

## 2022-09-10 DIAGNOSIS — E669 Obesity, unspecified: Secondary | ICD-10-CM

## 2022-09-10 DIAGNOSIS — J209 Acute bronchitis, unspecified: Secondary | ICD-10-CM | POA: Diagnosis not present

## 2022-09-10 DIAGNOSIS — I10 Essential (primary) hypertension: Secondary | ICD-10-CM | POA: Diagnosis not present

## 2022-09-10 DIAGNOSIS — J302 Other seasonal allergic rhinitis: Secondary | ICD-10-CM

## 2022-09-10 DIAGNOSIS — E785 Hyperlipidemia, unspecified: Secondary | ICD-10-CM

## 2022-09-10 DIAGNOSIS — R7302 Impaired glucose tolerance (oral): Secondary | ICD-10-CM

## 2022-09-10 MED ORDER — PROMETHAZINE-DM 6.25-15 MG/5ML PO SYRP: ORAL_SOLUTION | ORAL | 0 refills | Status: DC

## 2022-09-10 MED ORDER — AZITHROMYCIN 250 MG PO TABS: ORAL_TABLET | ORAL | 0 refills | Status: AC

## 2022-09-10 MED ORDER — BENZONATATE 100 MG PO CAPS: 100.0000 mg | ORAL_CAPSULE | Freq: Two times a day (BID) | ORAL | 0 refills | Status: DC | PRN

## 2022-09-10 NOTE — Patient Instructions (Addendum)
F/U in  6 months, call if you need me sooner  You are treated for acute bronchitis,   Please get cXR at hospital today  Work excuise from 12/29 to return 09/14/2022  Thanks for choosing [facility], we consider it a privelige to serve you.

## 2022-09-11 LAB — CBC
Hematocrit: 36 % (ref 34.0–46.6)
Hemoglobin: 12.5 g/dL (ref 11.1–15.9)
MCH: 30.6 pg (ref 26.6–33.0)
MCHC: 34.7 g/dL (ref 31.5–35.7)
MCV: 88 fL (ref 79–97)
Platelets: 338 10*3/uL (ref 150–450)
RBC: 4.09 x10E6/uL (ref 3.77–5.28)
RDW: 11.9 % (ref 11.7–15.4)
WBC: 5.7 10*3/uL (ref 3.4–10.8)

## 2022-09-11 LAB — CMP14+EGFR
ALT: 13 IU/L (ref 0–32)
AST: 18 IU/L (ref 0–40)
Albumin/Globulin Ratio: 1.4 (ref 1.2–2.2)
Albumin: 4.2 g/dL (ref 3.8–4.9)
Alkaline Phosphatase: 60 IU/L (ref 44–121)
BUN/Creatinine Ratio: 17 (ref 9–23)
BUN: 20 mg/dL (ref 6–24)
Bilirubin Total: 0.2 mg/dL (ref 0.0–1.2)
CO2: 23 mmol/L (ref 20–29)
Calcium: 8.9 mg/dL (ref 8.7–10.2)
Chloride: 98 mmol/L (ref 96–106)
Creatinine, Ser: 1.2 mg/dL — ABNORMAL HIGH (ref 0.57–1.00)
Globulin, Total: 2.9 g/dL (ref 1.5–4.5)
Glucose: 110 mg/dL — ABNORMAL HIGH (ref 70–99)
Potassium: 3.7 mmol/L (ref 3.5–5.2)
Sodium: 136 mmol/L (ref 134–144)
Total Protein: 7.1 g/dL (ref 6.0–8.5)
eGFR: 52 mL/min/{1.73_m2} — ABNORMAL LOW (ref >59)

## 2022-09-11 LAB — LIPID PANEL
Chol/HDL Ratio: 3.5 ratio (ref 0.0–4.4)
Cholesterol, Total: 164 mg/dL (ref 100–199)
HDL: 47 mg/dL (ref >39)
LDL Chol Calc (NIH): 97 mg/dL (ref 0–99)
Triglycerides: 111 mg/dL (ref 0–149)
VLDL Cholesterol Cal: 20 mg/dL (ref 5–40)

## 2022-09-11 LAB — HEMOGLOBIN A1C
Est. average glucose Bld gHb Est-mCnc: 140 mg/dL
Hgb A1c MFr Bld: 6.5 % — ABNORMAL HIGH (ref 4.8–5.6)

## 2022-09-11 LAB — TSH: TSH: 3.65 u[IU]/mL (ref 0.450–4.500)

## 2022-09-11 LAB — VITAMIN D 25 HYDROXY (VIT D DEFICIENCY, FRACTURES): Vit D, 25-Hydroxy: 49.3 ng/mL (ref 30.0–100.0)

## 2022-09-13 ENCOUNTER — Encounter: Payer: Self-pay | Admitting: Family Medicine

## 2022-09-13 DIAGNOSIS — J209 Acute bronchitis, unspecified: Secondary | ICD-10-CM | POA: Insufficient documentation

## 2022-09-13 NOTE — Assessment & Plan Note (Signed)
Hyperlipidemia:Low fat diet discussed and encouraged.   Lipid Panel  Lab Results  Component Value Date   CHOL 164 09/10/2022   HDL 47 09/10/2022   LDLCALC 97 09/10/2022   TRIG 111 09/10/2022   CHOLHDL 3.5 09/10/2022     Controlled,

## 2022-09-13 NOTE — Assessment & Plan Note (Signed)
  Patient re-educated about  the importance of commitment to a  minimum of 150 minutes of exercise per week as able.  The importance of healthy food choices with portion control discussed, as well as eating regularly and within a 12 hour window most days. The need to choose "clean , green" food 50 to 75% of the time is discussed, as well as to make water the primary drink and set a goal of 64 ounces water daily.       09/10/2022    3:40 PM 07/07/2022    8:11 AM 01/21/2022   11:26 AM  Weight /BMI  Weight 194 lb 0.6 oz 194 lb 1.9 oz 197 lb 0.6 oz  Height '5\' 5"'$  (1.651 m) '5\' 5"'$  (1.651 m) '5\' 5"'$  (1.651 m)  BMI 32.29 kg/m2 32.3 kg/m2 32.79 kg/m2

## 2022-09-13 NOTE — Assessment & Plan Note (Signed)
CXR, work excuse until Jan 1 Z pack, tessalon perles , phenergan DM

## 2022-09-13 NOTE — Progress Notes (Signed)
Grace Valdez     MRN: [id number]      DOB: Sep 27, 1965   HPI Grace Valdez is here with 4 day h/o cough , congestion, increasing weakness and headache.She was treated for uRI approximately 5 weeks ago, seemed to have improved , and in the past 4 days she has developed new symptoms c/o chills , no documented fever but thinks she likely had one Also her for f/u chronic medical conditions  ROS C/o increased nasal congestion and drainage. Denies chest pains, palpitations and leg swelling Denies abdominal pain, nausea, vomiting,diarrhea or constipation.   Denies dysuria, frequency, hesitancy or incontinence. . Denies depression, anxiety or insomnia. Denies skin break down or rash.   PE  BP 125/82 (BP Location: Right Arm, Patient Position: Sitting, Cuff Size: Large)   Pulse 84   Ht '5\' 5"'$  (1.651 m)   Wt 194 lb 0.6 oz (88 kg)   LMP 01/24/2013   SpO2 95%   BMI 32.29 kg/m   Patient alert and oriented ill appearing,  HEENT: nasal congestion, increased anterior nasal drainage frontal and maxillary  sinus tenderness, TM clear,oropharynx , no erythema or exudate Chest: decreased air entry scattered crackles and few wheezes CVS: S1, S2 no murmurs, no S3.Regular rate.  ABD: Soft non tender.   Ext: No edema  MS: Adequate ROM spine, shoulders, hips and knees.  Skin: Intact, no ulcerations or rash noted.  Psych: Good eye contact, normal affect. Memory intact not anxious or depressed appearing.  CNS: CN 2-12 intact, power,  normal throughout.no focal deficits noted.   Assessment & Plan  Acute bronchitis CXR, work excuse until Jan 1 Z pack, tessalon perles , phenergan DM  Essential hypertension .cpnq DASH diet and commitment to daily physical activity for a minimum of 30 minutes discussed and encouraged, as a part of hypertension management. The importance of attaining a healthy weight is also discussed.     09/10/2022    3:40 PM 07/07/2022    8:11 AM 01/21/2022   11:26 AM  11/12/2021    9:02 AM 07/03/2021    8:05 AM 01/01/2021    9:37 AM 11/19/2020    7:55 AM  BP/Weight  Systolic BP 782 956 213 086 578 469 96  Diastolic BP 82 73 61 82 70 77 61  Wt. (Lbs) 194.04 194.12 197.04 196.4 198.12 197   BMI 32.29 kg/m2 32.3 kg/m2 32.79 kg/m2 32.68 kg/m2 32.97 kg/m2 32.78 kg/m2        Obesity (BMI 30.0-34.9)  Patient re-educated about  the importance of commitment to a  minimum of 150 minutes of exercise per week as able.  The importance of healthy food choices with portion control discussed, as well as eating regularly and within a 12 hour window most days. The need to choose "clean , green" food 50 to 75% of the time is discussed, as well as to make water the primary drink and set a goal of 64 ounces water daily.       09/10/2022    3:40 PM 07/07/2022    8:11 AM 01/21/2022   11:26 AM  Weight /BMI  Weight 194 lb 0.6 oz 194 lb 1.9 oz 197 lb 0.6 oz  Height '5\' 5"'$  (1.651 m) '5\' 5"'$  (1.651 m) '5\' 5"'$  (1.651 m)  BMI 32.29 kg/m2 32.3 kg/m2 32.79 kg/m2      Seasonal allergies Currently increased needs to commit to daily use of medication for better control  Dyslipidemia, goal LDL below 100 Hyperlipidemia:Low fat diet discussed  and encouraged.   Lipid Panel  Lab Results  Component Value Date   CHOL 164 09/10/2022   HDL 47 09/10/2022   LDLCALC 97 09/10/2022   TRIG 111 09/10/2022   CHOLHDL 3.5 09/10/2022     Controlled,    IGT (impaired glucose tolerance) Patient educated about the importance of limiting  Carbohydrate intake , the need to commit to daily physical activity for a minimum of 30 minutes , and to commit weight loss. The fact that changes in all these areas will reduce or eliminate all together the development of diabetes is stressed.      Latest Ref Rng & Units 09/10/2022    9:33 AM 07/01/2022    9:05 AM 01/13/2022   10:05 AM 07/01/2021   11:21 AM 01/01/2021   10:18 AM  Diabetic Labs  HbA1c 4.8 - 5.6 % 6.5  6.3  6.2   6.1   Chol 100 -  199 mg/dL 164  222  231  211    HDL >39 mg/dL 47  65  63  58    Calc LDL 0 - 99 mg/dL 97  140  150  129    Triglycerides 0 - 149 mg/dL 111  95  101  135    Creatinine 0.57 - 1.00 mg/dL 1.20  1.07  0.97  1.12        09/10/2022    3:40 PM 07/07/2022    8:11 AM 01/21/2022   11:26 AM 11/12/2021    9:02 AM 07/03/2021    8:05 AM 01/01/2021    9:37 AM 11/19/2020    7:55 AM  BP/Weight  Systolic BP 825 003 704 888 916 945 96  Diastolic BP 82 73 61 82 70 77 61  Wt. (Lbs) 194.04 194.12 197.04 196.4 198.12 197   BMI 32.29 kg/m2 32.3 kg/m2 32.79 kg/m2 32.68 kg/m2 32.97 kg/m2 32.78 kg/m2        No data to display         Deteriortaed needs to follow low car diet and reduce intake of sugar

## 2022-09-13 NOTE — Assessment & Plan Note (Signed)
Patient educated about the importance of limiting  Carbohydrate intake , the need to commit to daily physical activity for a minimum of 30 minutes , and to commit weight loss. The fact that changes in all these areas will reduce or eliminate all together the development of diabetes is stressed.      Latest Ref Rng & Units 09/10/2022    9:33 AM 07/01/2022    9:05 AM 01/13/2022   10:05 AM 07/01/2021   11:21 AM 01/01/2021   10:18 AM  Diabetic Labs  HbA1c 4.8 - 5.6 % 6.5  6.3  6.2   6.1   Chol 100 - 199 mg/dL 164  222  231  211    HDL >39 mg/dL 47  65  63  58    Calc LDL 0 - 99 mg/dL 97  140  150  129    Triglycerides 0 - 149 mg/dL 111  95  101  135    Creatinine 0.57 - 1.00 mg/dL 1.20  1.07  0.97  1.12        09/10/2022    3:40 PM 07/07/2022    8:11 AM 01/21/2022   11:26 AM 11/12/2021    9:02 AM 07/03/2021    8:05 AM 01/01/2021    9:37 AM 11/19/2020    7:55 AM  BP/Weight  Systolic BP 159 458 592 924 462 863 96  Diastolic BP 82 73 61 82 70 77 61  Wt. (Lbs) 194.04 194.12 197.04 196.4 198.12 197   BMI 32.29 kg/m2 32.3 kg/m2 32.79 kg/m2 32.68 kg/m2 32.97 kg/m2 32.78 kg/m2        No data to display         Deteriortaed needs to follow low car diet and reduce intake of sugar

## 2022-09-13 NOTE — Assessment & Plan Note (Signed)
Currently increased needs to commit to daily use of medication for better control

## 2022-09-13 NOTE — Assessment & Plan Note (Signed)
.  cpnq DASH diet and commitment to daily physical activity for a minimum of 30 minutes discussed and encouraged, as a part of hypertension management. The importance of attaining a healthy weight is also discussed.     09/10/2022    3:40 PM 07/07/2022    8:11 AM 01/21/2022   11:26 AM 11/12/2021    9:02 AM 07/03/2021    8:05 AM 01/01/2021    9:37 AM 11/19/2020    7:55 AM  BP/Weight  Systolic BP 076 808 811 031 594 585 96  Diastolic BP 82 73 61 82 70 77 61  Wt. (Lbs) 194.04 194.12 197.04 196.4 198.12 197   BMI 32.29 kg/m2 32.3 kg/m2 32.79 kg/m2 32.68 kg/m2 32.97 kg/m2 32.78 kg/m2

## 2022-09-16 ENCOUNTER — Ambulatory Visit: Payer: 59 | Admitting: Family Medicine

## 2022-10-02 ENCOUNTER — Encounter (HOSPITAL_COMMUNITY): Payer: Self-pay

## 2022-10-02 ENCOUNTER — Emergency Department (HOSPITAL_COMMUNITY)
Admission: EM | Admit: 2022-10-02 | Discharge: 2022-10-03 | Disposition: A | Discharge status: Home / Self Care | Payer: 59 | Attending: Emergency Medicine | Admitting: Emergency Medicine

## 2022-10-02 ENCOUNTER — Other Ambulatory Visit: Payer: Self-pay

## 2022-10-02 ENCOUNTER — Emergency Department (HOSPITAL_COMMUNITY): Payer: 59

## 2022-10-02 DIAGNOSIS — K1120 Sialoadenitis, unspecified: Secondary | ICD-10-CM | POA: Insufficient documentation

## 2022-10-02 DIAGNOSIS — Z20822 Contact with and (suspected) exposure to covid-19: Secondary | ICD-10-CM | POA: Diagnosis not present

## 2022-10-02 DIAGNOSIS — I889 Nonspecific lymphadenitis, unspecified: Secondary | ICD-10-CM | POA: Diagnosis not present

## 2022-10-02 DIAGNOSIS — R221 Localized swelling, mass and lump, neck: Secondary | ICD-10-CM

## 2022-10-02 LAB — CBC WITH DIFFERENTIAL/PLATELET
Abs Immature Granulocytes: 0.04 10*3/uL (ref 0.00–0.07)
Basophils Absolute: 0 10*3/uL (ref 0.0–0.1)
Basophils Relative: 0 %
Eosinophils Absolute: 0.3 10*3/uL (ref 0.0–0.5)
Eosinophils Relative: 3 %
HCT: 36.2 % (ref 36.0–46.0)
Hemoglobin: 12.2 g/dL (ref 12.0–15.0)
Immature Granulocytes: 0 %
Lymphocytes Relative: 26 %
Lymphs Abs: 3 10*3/uL (ref 0.7–4.0)
MCH: 31.2 pg (ref 26.0–34.0)
MCHC: 33.7 g/dL (ref 30.0–36.0)
MCV: 92.6 fL (ref 80.0–100.0)
Monocytes Absolute: 0.6 10*3/uL (ref 0.1–1.0)
Monocytes Relative: 5 %
Neutro Abs: 7.4 10*3/uL (ref 1.7–7.7)
Neutrophils Relative %: 66 %
Platelets: 322 10*3/uL (ref 150–400)
RBC: 3.91 MIL/uL (ref 3.87–5.11)
RDW: 13.8 % (ref 11.5–15.5)
WBC: 11.3 10*3/uL — ABNORMAL HIGH (ref 4.0–10.5)
nRBC: 0 % (ref 0.0–0.2)

## 2022-10-02 LAB — BASIC METABOLIC PANEL
Anion gap: 11 (ref 5–15)
BUN: 24 mg/dL — ABNORMAL HIGH (ref 6–20)
CO2: 23 mmol/L (ref 22–32)
Calcium: 9.1 mg/dL (ref 8.9–10.3)
Chloride: 102 mmol/L (ref 98–111)
Creatinine, Ser: 1.07 mg/dL — ABNORMAL HIGH (ref 0.44–1.00)
GFR, Estimated: >60 mL/min (ref >60)
Glucose, Bld: 118 mg/dL — ABNORMAL HIGH (ref 70–99)
Potassium: 3.1 mmol/L — ABNORMAL LOW (ref 3.5–5.1)
Sodium: 136 mmol/L (ref 135–145)

## 2022-10-02 LAB — RESP PANEL BY RT-PCR (RSV, FLU A&B, COVID)  RVPGX2
Influenza A by PCR: NEGATIVE
Influenza B by PCR: NEGATIVE
Resp Syncytial Virus by PCR: NEGATIVE
SARS Coronavirus 2 by RT PCR: NEGATIVE

## 2022-10-02 LAB — GROUP A STREP BY PCR: Group A Strep by PCR: NOT DETECTED

## 2022-10-02 MED ORDER — IOHEXOL 300 MG/ML  SOLN
100.0000 mL | Freq: Once | INTRAMUSCULAR | Status: AC | PRN
  Administered 2022-10-03: 75 mL via INTRAVENOUS

## 2022-10-02 NOTE — ED Provider Notes (Signed)
[facility] Provider Note   CSN: [id number] Arrival date & time: 10/02/22  2027     History  Chief Complaint  Patient presents with   Sore Throat    Grace Valdez is a 57 y.o. female who presents emergency department with chief complaint of swelling in her throat.  Patient states that she developed sudden onset and progressively worsening swelling under the angle of her mandible.  She states that it is sore and she has some soreness when swallowing but denies any sore throat, change in voice, lip or tongue swelling or wheezing.  She has never had anything like this before.  She does admit that this morning she woke up with a dry mouth.  She has no other complaints at this time    Sore Throat       Home Medications Prior to Admission medications   Medication Sig Start Date End Date Taking? Authorizing Provider  benzonatate (TESSALON) 100 MG capsule Take 1 capsule (100 mg total) by mouth 2 (two) times daily as needed for cough. 09/10/22   Fayrene Helper, MD  Calcium Carb-Cholecalciferol (CALTRATE 600+D3) 600-20 MG-MCG TABS Yake one tablet by mouth twice daily for bone and muscle health 07/07/22   Fayrene Helper, MD  fluticasone (FLONASE SENSIMIST) 27.5 MCG/SPRAY nasal spray Place 2 sprays into the nose daily. 07/07/22   Fayrene Helper, MD  loratadine (CLARITIN) 10 MG tablet Take 1 tablet (10 mg total) by mouth daily. 07/03/21   Fayrene Helper, MD  promethazine-dextromethorphan (PROMETHAZINE-DM) 6.25-15 MG/5ML syrup Take one teaspoon by mouth at bedtime, as needed, for cough 09/10/22   Fayrene Helper, MD  triamterene-hydrochlorothiazide (MAXZIDE-25) 37.5-25 MG tablet Take 1 tablet by mouth daily. 07/07/22   Fayrene Helper, MD      Allergies    Phentermine    Review of Systems   Review of Systems  Physical Exam Updated Vital Signs BP (!) 152/78 (BP Location: Right Arm)   Pulse 75   Temp 97.7 F (36.5  C) (Oral)   Resp 19   Ht '5\' 5"'$  (1.651 m)   Wt 88 kg   LMP 01/24/2013   SpO2 97%   BMI 32.28 kg/m  Physical Exam  ED Results / Procedures / Treatments   Labs (all labs ordered are listed, but only abnormal results are displayed) Labs Reviewed  RESP PANEL BY RT-PCR (RSV, FLU A&B, COVID)  [id number]  GROUP A STREP BY PCR  BASIC METABOLIC PANEL  CBC WITH DIFFERENTIAL/PLATELET    EKG None  Radiology No results found.  Procedures Procedures    Medications Ordered in ED Medications - No data to display  ED Course/ Medical Decision Making/ A&P                             Medical Decision Making Patient here with swelling of the hypoglossal region. DDX includes sialoadenitis, Lymphadenitis. I do not think that this is Ludwig's angina or anaphylaxis. Work up pending, sign out to Dr. Roxanne Mins at shift change.  Amount and/or Complexity of Data Reviewed Labs: ordered. Radiology: ordered.  Risk Prescription drug management.          Final Clinical Impression(s) / ED Diagnoses Final diagnoses:  None    Rx / DC Orders ED Discharge Orders     None         Margarita Mail, PA-C 10/04/22 1104  Hayden Rasmussen, MD 10/04/22 [phone]

## 2022-10-02 NOTE — ED Notes (Signed)
ED Provider at bedside. 

## 2022-10-02 NOTE — ED Provider Notes (Signed)
Care assumed from [facility], patient with enck swelling pending CT of neck.  CT shows no abnormalities.  I have independently viewed the images, and agree with the radiologist's interpretation.  Patient states that the swelling has gone down but she still thinks there is a little bit of swelling present.  On exam, I cannot detect any definite abnormalities.  I have ordered a dose of dexamethasone and I am discharging her with instructions to return if the swelling recurs.  I suspect she may have had transient obstruction of her salivary duct.  Results for orders placed or performed during the hospital encounter of 10/02/22  Resp panel by RT-PCR (RSV, Flu A&B, Covid) Anterior Nasal Swab   Specimen: Anterior Nasal Swab  Result Value Ref Range   SARS Coronavirus 2 by RT PCR NEGATIVE NEGATIVE   Influenza A by PCR NEGATIVE NEGATIVE   Influenza B by PCR NEGATIVE NEGATIVE   Resp Syncytial Virus by PCR NEGATIVE NEGATIVE  Group A Strep by PCR   Specimen: Throat; Sterile Swab  Result Value Ref Range   Group A Strep by PCR NOT DETECTED NOT DETECTED  Basic metabolic panel  Result Value Ref Range   Sodium 136 135 - 145 mmol/L   Potassium 3.1 (L) 3.5 - 5.1 mmol/L   Chloride 102 98 - 111 mmol/L   CO2 23 22 - 32 mmol/L   Glucose, Bld 118 (H) 70 - 99 mg/dL   BUN 24 (H) 6 - 20 mg/dL   Creatinine, Ser 1.07 (H) 0.44 - 1.00 mg/dL   Calcium 9.1 8.9 - 10.3 mg/dL   GFR, Estimated >60 >60 mL/min   Anion gap 11 5 - 15  CBC with Differential  Result Value Ref Range   WBC 11.3 (H) 4.0 - 10.5 K/uL   RBC 3.91 3.87 - 5.11 MIL/uL   Hemoglobin 12.2 12.0 - 15.0 g/dL   HCT 36.2 36.0 - 46.0 %   MCV 92.6 80.0 - 100.0 fL   MCH 31.2 26.0 - 34.0 pg   MCHC 33.7 30.0 - 36.0 g/dL   RDW 13.8 11.5 - 15.5 %   Platelets 322 150 - 400 K/uL   nRBC 0.0 0.0 - 0.2 %   Neutrophils Relative % 66 %   Neutro Abs 7.4 1.7 - 7.7 K/uL   Lymphocytes Relative 26 %   Lymphs Abs 3.0 0.7 - 4.0 K/uL   Monocytes Relative 5 %    Monocytes Absolute 0.6 0.1 - 1.0 K/uL   Eosinophils Relative 3 %   Eosinophils Absolute 0.3 0.0 - 0.5 K/uL   Basophils Relative 0 %   Basophils Absolute 0.0 0.0 - 0.1 K/uL   Immature Granulocytes 0 %   Abs Immature Granulocytes 0.04 0.00 - 0.07 K/uL   CT Soft Tissue Neck W Contrast  Result Date: 10/03/2022 CLINICAL DATA:  Soft tissue infection suspected EXAM: CT NECK WITH CONTRAST TECHNIQUE: Multidetector CT imaging of the neck was performed using the standard protocol following the bolus administration of intravenous contrast. RADIATION DOSE REDUCTION: This exam was performed according to the departmental dose-optimization program which includes automated exposure control, adjustment of the mA and/or kV according to patient size and/or use of iterative reconstruction technique. CONTRAST:  23m OMNIPAQUE IOHEXOL 300 MG/ML  SOLN COMPARISON:  None Available. FINDINGS: Pharynx and larynx: Normal. No mass or swelling. Salivary glands: No inflammation, mass, or stone. Thyroid: Normal. Lymph nodes: None enlarged or abnormal density. Vascular: Negative. Limited intracranial: Negative. Visualized orbits: Negative. Mastoids and visualized paranasal  sinuses: Clear. Skeleton: No acute or aggressive process. Upper chest: Negative. Other: None. IMPRESSION: Normal CT of the neck. Electronically Signed   By: Ulyses Jarred M.D.   On: 10/03/2022 00:52   DG Chest 2 View  Result Date: 09/10/2022 CLINICAL DATA:  Cough, fever, shortness of breath and URI symptoms since 09/07/2022 EXAM: CHEST - 2 VIEW COMPARISON:  None Available. FINDINGS: Normal heart size, mediastinal contours, and pulmonary vascularity. Lungs clear. No pleural effusion or pneumothorax. Bones unremarkable. IMPRESSION: No acute abnormalities. Electronically Signed   By: Lavonia Dana M.D.   On: 53/20/2334 35:68      Delora Fuel, MD 61/68/37 [phone]

## 2022-10-02 NOTE — ED Triage Notes (Signed)
Pt reports her throat feels swollen and hurts on the right side when she swallows.

## 2022-10-03 MED ORDER — DEXAMETHASONE 4 MG PO TABS
10.0000 mg | ORAL_TABLET | Freq: Once | ORAL | Status: AC
  Administered 2022-10-03: 10 mg via ORAL
  Filled 2022-10-03: qty 3

## 2022-10-03 NOTE — ED Notes (Signed)
Patient transported to CT 

## 2022-10-03 NOTE — ED Notes (Signed)
ED Provider at bedside. 

## 2022-10-03 NOTE — Discharge Instructions (Signed)
Your CT scan did not show any abnormal swelling.  However, if the swelling comes back and is getting worse, return to the emergency department for reevaluation.

## 2022-10-06 ENCOUNTER — Telehealth: Payer: Self-pay

## 2022-10-06 NOTE — Telephone Encounter (Signed)
Transition Care Management Unsuccessful Follow-up Telephone Call  Date of discharge and from where:  [facility]  Attempts:  1st Attempt  Reason for unsuccessful TCM follow-up call:  No answer/busy

## 2022-12-22 ENCOUNTER — Other Ambulatory Visit (HOSPITAL_COMMUNITY): Payer: Self-pay | Admitting: Family Medicine

## 2022-12-22 DIAGNOSIS — Z1231 Encounter for screening mammogram for malignant neoplasm of breast: Secondary | ICD-10-CM

## 2022-12-23 ENCOUNTER — Other Ambulatory Visit: Payer: Self-pay | Admitting: Family Medicine

## 2022-12-30 ENCOUNTER — Encounter (HOSPITAL_COMMUNITY): Payer: Self-pay

## 2022-12-30 ENCOUNTER — Ambulatory Visit (HOSPITAL_COMMUNITY)
Admission: RE | Admit: 2022-12-30 | Discharge: 2022-12-30 | Disposition: A | Discharge status: Home / Self Care | Payer: 59 | Source: Ambulatory Visit | Attending: Family Medicine | Admitting: Family Medicine

## 2022-12-30 DIAGNOSIS — Z1231 Encounter for screening mammogram for malignant neoplasm of breast: Secondary | ICD-10-CM | POA: Diagnosis present

## 2023-01-21 ENCOUNTER — Observation Stay (HOSPITAL_COMMUNITY)
Admission: EM | Admit: 2023-01-21 | Discharge: 2023-01-23 | Disposition: A | Discharge status: Home / Self Care | Payer: 59 | Attending: Internal Medicine | Admitting: Internal Medicine

## 2023-01-21 ENCOUNTER — Other Ambulatory Visit: Payer: Self-pay

## 2023-01-21 ENCOUNTER — Emergency Department (HOSPITAL_COMMUNITY): Payer: 59

## 2023-01-21 ENCOUNTER — Encounter (HOSPITAL_COMMUNITY): Payer: Self-pay

## 2023-01-21 ENCOUNTER — Ambulatory Visit
Admission: EM | Admit: 2023-01-21 | Discharge: 2023-01-21 | Disposition: A | Discharge status: Home / Self Care | Payer: 59

## 2023-01-21 DIAGNOSIS — G459 Transient cerebral ischemic attack, unspecified: Principal | ICD-10-CM | POA: Insufficient documentation

## 2023-01-21 DIAGNOSIS — F17290 Nicotine dependence, other tobacco product, uncomplicated: Secondary | ICD-10-CM | POA: Insufficient documentation

## 2023-01-21 DIAGNOSIS — I10 Essential (primary) hypertension: Secondary | ICD-10-CM | POA: Diagnosis not present

## 2023-01-21 DIAGNOSIS — E785 Hyperlipidemia, unspecified: Secondary | ICD-10-CM | POA: Insufficient documentation

## 2023-01-21 DIAGNOSIS — R4182 Altered mental status, unspecified: Secondary | ICD-10-CM | POA: Diagnosis present

## 2023-01-21 DIAGNOSIS — E669 Obesity, unspecified: Secondary | ICD-10-CM

## 2023-01-21 DIAGNOSIS — G454 Transient global amnesia: Secondary | ICD-10-CM

## 2023-01-21 LAB — COMPREHENSIVE METABOLIC PANEL
ALT: 18 U/L (ref 0–44)
AST: 21 U/L (ref 15–41)
Albumin: 4.1 g/dL (ref 3.5–5.0)
Alkaline Phosphatase: 55 U/L (ref 38–126)
Anion gap: 8 (ref 5–15)
BUN: 21 mg/dL — ABNORMAL HIGH (ref 6–20)
CO2: 24 mmol/L (ref 22–32)
Calcium: 9 mg/dL (ref 8.9–10.3)
Chloride: 105 mmol/L (ref 98–111)
Creatinine, Ser: 1.02 mg/dL — ABNORMAL HIGH (ref 0.44–1.00)
GFR, Estimated: >60 mL/min (ref >60)
Glucose, Bld: 129 mg/dL — ABNORMAL HIGH (ref 70–99)
Potassium: 3.7 mmol/L (ref 3.5–5.1)
Sodium: 137 mmol/L (ref 135–145)
Total Bilirubin: 0.6 mg/dL (ref 0.3–1.2)
Total Protein: 7.7 g/dL (ref 6.5–8.1)

## 2023-01-21 LAB — URINALYSIS, ROUTINE W REFLEX MICROSCOPIC
Bilirubin Urine: NEGATIVE
Glucose, UA: NEGATIVE mg/dL
Ketones, ur: NEGATIVE mg/dL
Leukocytes,Ua: NEGATIVE
Nitrite: NEGATIVE
Protein, ur: NEGATIVE mg/dL
Specific Gravity, Urine: 1.02 (ref 1.005–1.030)
pH: 5 (ref 5.0–8.0)

## 2023-01-21 LAB — CBC
HCT: 38 % (ref 36.0–46.0)
Hemoglobin: 12.4 g/dL (ref 12.0–15.0)
MCH: 30.7 pg (ref 26.0–34.0)
MCHC: 32.6 g/dL (ref 30.0–36.0)
MCV: 94.1 fL (ref 80.0–100.0)
Platelets: 354 10*3/uL (ref 150–400)
RBC: 4.04 MIL/uL (ref 3.87–5.11)
RDW: 13.2 % (ref 11.5–15.5)
WBC: 9.8 10*3/uL (ref 4.0–10.5)
nRBC: 0 % (ref 0.0–0.2)

## 2023-01-21 LAB — CBG MONITORING, ED: Glucose-Capillary: 113 mg/dL — ABNORMAL HIGH (ref 70–99)

## 2023-01-21 LAB — PREGNANCY, URINE: Preg Test, Ur: NEGATIVE

## 2023-01-21 MED ORDER — IOHEXOL 350 MG/ML SOLN
75.0000 mL | Freq: Once | INTRAVENOUS | Status: AC | PRN
  Administered 2023-01-21: 75 mL via INTRAVENOUS

## 2023-01-21 NOTE — ED Triage Notes (Signed)
Family reports pt was not herself today around 5:00pm. She was asking questions she should already know the answer to such as "what are we doing here? " While at work. Pt is supposed to be going to [place] but does not remember what she is going to [place] for.   Pt denies numbness and tingling in her arms or legs.

## 2023-01-21 NOTE — ED Triage Notes (Signed)
Pt presents with forgetfulness that started around 1630 this afternoon. States that she was at work and was asking questions that she already knew the answers too. No slurred speech notes, no numbness, no weakness. Pt was just seen at [facility] and told to come here for further evaluation. Pt is alert and oriented x4 at this time.

## 2023-01-21 NOTE — ED Notes (Signed)
Pt alert and oriented to self, place. Equal grips. Facial symmetry. Speech clear. Pt appears dazed. LKW per family 1630.

## 2023-01-21 NOTE — ED Provider Notes (Signed)
Patient presents today to urgent care with sister for sudden onset of forgetfulness around 4:30 pm today.  Reports patient was asking questions repeatedly such as "what are we doing here" while working.  No slurred speech, obvious facial droop, or unilateral weakness.  Reports this has resolved since it occurred.  No blurred vision, double vision, trouble swallowing.  No facial weakness, upper extremity or lower extremity weakness.  No chest pain or shortness of breath.  No history of strokes.  Patient took blood pressure medication prior to arrival today.  Patient states repeatedly in office, "I am fine."  Patient is neurologically intact today and vital signs are stable.  Given her symptoms, still history of Bonita Quin, I recommended further evaluation and management in the emergency room.  Patient is in agreement to this plan and is safe to transport via private vehicle at this time.   Valentino Nose, NP 01/21/23 2028

## 2023-01-21 NOTE — ED Provider Notes (Signed)
[place] EMERGENCY DEPARTMENT AT [facility] Provider Note   CSN: [id number] Arrival date & time: 01/21/23  2017     History  Chief Complaint  Patient presents with   Altered Mental Status    Grace Valdez is a 57 y.o. female.   Altered Mental Status Patient brought in for mental status.  Reportedly starting this afternoon for around 3 hours was confused.  Did not know answers to questions she should have known the answer to such as why she was traveling to [place].  Reportedly said she was fine.  Went to urgent care.  Patient does not have a whole lot of memory of the episode.  No headache.  No confusion.  Patient states she thinks she was off because she ate some fat back this morning.  Did not eat lunch.  Able to remember normally now except for that period of time.  No headache.  No numbness or weakness.  During the episode was having no other neurodeficits and was able to speak clearly and walk normally.    Past Medical History:  Diagnosis Date   Hypertension     Home Medications Prior to Admission medications   Medication Sig Start Date End Date Taking? Authorizing Provider  benzonatate (TESSALON) 100 MG capsule Take 1 capsule (100 mg total) by mouth 2 (two) times daily as needed for cough. 09/10/22   Kerri Perches, MD  Calcium Carb-Cholecalciferol (CALTRATE 600+D3) 600-20 MG-MCG TABS Yake one tablet by mouth twice daily for bone and muscle health 07/07/22   Kerri Perches, MD  fluticasone (FLONASE SENSIMIST) 27.5 MCG/SPRAY nasal spray Place 2 sprays into the nose daily. 07/07/22   Kerri Perches, MD  loratadine (EQ ALLERGY RELIEF) 10 MG tablet TAKE 1 TABLET BY MOUTH ONCE DAILY 12/24/22   Kerri Perches, MD  promethazine-dextromethorphan (PROMETHAZINE-DM) 6.25-15 MG/5ML syrup Take one teaspoon by mouth at bedtime, as needed, for cough 09/10/22   Kerri Perches, MD  triamterene-hydrochlorothiazide (MAXZIDE-25) 37.5-25 MG tablet Take 1  tablet by mouth daily. 07/07/22   Kerri Perches, MD      Allergies    Phentermine    Review of Systems   Review of Systems  Physical Exam Updated Vital Signs BP 127/89   Pulse 72   Temp 97.8 F (36.6 C)   Resp 18   Wt 88.5 kg   LMP 01/24/2013   SpO2 98%   BMI 32.45 kg/m  Physical Exam Vitals and nursing note reviewed.  Cardiovascular:     Rate and Rhythm: Regular rhythm.  Chest:     Chest wall: No tenderness.  Abdominal:     Tenderness: There is no abdominal tenderness.  Musculoskeletal:        General: No tenderness.     Cervical back: Neck supple.  Skin:    General: Skin is warm.     Capillary Refill: Capillary refill takes less than 2 seconds.  Neurological:     Mental Status: She is alert and oriented to person, place, and time. Mental status is at baseline.     ED Results / Procedures / Treatments   Labs (all labs ordered are listed, but only abnormal results are displayed) Labs Reviewed  COMPREHENSIVE METABOLIC PANEL - Abnormal; Notable for the following components:      Result Value   Glucose, Bld 129 (*)    BUN 21 (*)    Creatinine, Ser 1.02 (*)    All other components within normal limits  URINALYSIS, ROUTINE W REFLEX MICROSCOPIC - Abnormal; Notable for the following components:   Hgb urine dipstick SMALL (*)    Bacteria, UA RARE (*)    All other components within normal limits  CBG MONITORING, ED - Abnormal; Notable for the following components:   Glucose-Capillary 113 (*)    All other components within normal limits  CBC  PREGNANCY, URINE    EKG None  Radiology CT ANGIO HEAD NECK W WO CM  Result Date: 01/21/2023 CLINICAL DATA:  Forgetfulness.  Altered mental status. EXAM: CT ANGIOGRAPHY HEAD AND NECK WITH AND WITHOUT CONTRAST TECHNIQUE: Multidetector CT imaging of the head and neck was performed using the standard protocol during bolus administration of intravenous contrast. Multiplanar CT image reconstructions and MIPs were obtained  to evaluate the vascular anatomy. Carotid stenosis measurements (when applicable) are obtained utilizing NASCET criteria, using the distal internal carotid diameter as the denominator. RADIATION DOSE REDUCTION: This exam was performed according to the departmental dose-optimization program which includes automated exposure control, adjustment of the mA and/or kV according to patient size and/or use of iterative reconstruction technique. CONTRAST:  75mL OMNIPAQUE IOHEXOL 350 MG/ML SOLN COMPARISON:  None Available. FINDINGS: CT HEAD FINDINGS Brain: There is no mass, hemorrhage or extra-axial collection. The size and configuration of the ventricles and extra-axial CSF spaces are normal. There is no acute or chronic infarction. The brain parenchyma is normal. Skull: The visualized skull base, calvarium and extracranial soft tissues are normal. Sinuses/Orbits: No fluid levels or advanced mucosal thickening of the visualized paranasal sinuses. No mastoid or middle ear effusion. The orbits are normal. CTA NECK FINDINGS SKELETON: There is no bony spinal canal stenosis. No lytic or blastic lesion. OTHER NECK: Normal pharynx, larynx and major salivary glands. No cervical lymphadenopathy. Unremarkable thyroid gland. UPPER CHEST: No pneumothorax or pleural effusion. No nodules or masses. AORTIC ARCH: There is no calcific atherosclerosis of the aortic arch. There is no aneurysm, dissection or hemodynamically significant stenosis of the visualized portion of the aorta. Normal variant aortic arch branching pattern with the brachiocephalic and left common carotid arteries sharing a common origin. The visualized proximal subclavian arteries are widely patent. RIGHT CAROTID SYSTEM: Normal without aneurysm, dissection or stenosis. LEFT CAROTID SYSTEM: Normal without aneurysm, dissection or stenosis. VERTEBRAL ARTERIES: Left dominant configuration. Both origins are clearly patent. There is no dissection, occlusion or flow-limiting  stenosis to the skull base (V1-V3 segments). CTA HEAD FINDINGS POSTERIOR CIRCULATION: --Vertebral arteries: Normal V4 segments. --Inferior cerebellar arteries: Normal. --Basilar artery: Normal. --Superior cerebellar arteries: Normal. --Posterior cerebral arteries (PCA): Normal. ANTERIOR CIRCULATION: --Intracranial internal carotid arteries: Normal. --Anterior cerebral arteries (ACA): Normal. Both A1 segments are present. Patent anterior communicating artery (a-comm). --Middle cerebral arteries (MCA): Normal. VENOUS SINUSES: As permitted by contrast timing, patent. ANATOMIC VARIANTS: None Review of the MIP images confirms the above findings. IMPRESSION: Normal CTA of the head and neck. Electronically Signed   By: Deatra Robinson M.D.   On: 01/21/2023 23:38    Procedures Procedures    Medications Ordered in ED Medications  iohexol (OMNIPAQUE) 350 MG/ML injection 75 mL (75 mLs Intravenous Contrast Given 01/21/23 2257)    ED Course/ Medical Decision Making/ A&P                             Medical Decision Making Amount and/or Complexity of Data Reviewed Labs: ordered. Radiology: ordered.  Risk Prescription drug management.   Patient episode of memory loss.  Lasted couple  hours.  No other focal exam time.  Differential diagnosis includes condition such as encephalopathy with transient global amnesia is more likely.  Discussed with neurology, Sal recommends admission to the hospital to [facility] for further TIA workup.  Not a TNK candidate due to resolution of symptoms.  Lab work reassuring.  Head CT does not show clear cause.  Will discuss with hospitalist for admission.        Final Clinical Impression(s) / ED Diagnoses Final diagnoses:  Transient global amnesia    Rx / DC Orders ED Discharge Orders     None         Benjiman Core, MD 01/21/23 2342

## 2023-01-21 NOTE — ED Notes (Signed)
Patient is being discharged from the Urgent Care and sent to the Emergency Department via POV . Per provider, patient is in need of higher level of care due to altered mental status. Patient is aware and verbalizes understanding of plan of care.  Vitals:   01/21/23 1957  BP: (!) 161/86  Pulse: 66  Resp: 20  Temp: 98 F (36.7 C)  SpO2: 95%

## 2023-01-22 ENCOUNTER — Observation Stay (HOSPITAL_BASED_OUTPATIENT_CLINIC_OR_DEPARTMENT_OTHER): Payer: 59

## 2023-01-22 ENCOUNTER — Observation Stay (HOSPITAL_COMMUNITY): Payer: 59

## 2023-01-22 DIAGNOSIS — I10 Essential (primary) hypertension: Secondary | ICD-10-CM

## 2023-01-22 DIAGNOSIS — R4182 Altered mental status, unspecified: Secondary | ICD-10-CM | POA: Diagnosis present

## 2023-01-22 DIAGNOSIS — G459 Transient cerebral ischemic attack, unspecified: Secondary | ICD-10-CM | POA: Diagnosis not present

## 2023-01-22 DIAGNOSIS — G454 Transient global amnesia: Secondary | ICD-10-CM | POA: Diagnosis not present

## 2023-01-22 DIAGNOSIS — F17290 Nicotine dependence, other tobacco product, uncomplicated: Secondary | ICD-10-CM | POA: Diagnosis not present

## 2023-01-22 DIAGNOSIS — E785 Hyperlipidemia, unspecified: Secondary | ICD-10-CM

## 2023-01-22 LAB — ECHOCARDIOGRAM COMPLETE
Area-P 1/2: 4.8 cm2
MV M vel: 1.38 m/s
MV Peak grad: 7.6 mmHg
S' Lateral: 2.6 cm
Weight: 3120 oz

## 2023-01-22 LAB — LIPID PANEL
Cholesterol: 221 mg/dL — ABNORMAL HIGH (ref 0–200)
HDL: 60 mg/dL (ref >40)
LDL Cholesterol: 148 mg/dL — ABNORMAL HIGH (ref 0–99)
Total CHOL/HDL Ratio: 3.7 RATIO
Triglycerides: 66 mg/dL (ref <150)
VLDL: 13 mg/dL (ref 0–40)

## 2023-01-22 LAB — COMPREHENSIVE METABOLIC PANEL
ALT: 18 U/L (ref 0–44)
AST: 18 U/L (ref 15–41)
Albumin: 3.9 g/dL (ref 3.5–5.0)
Alkaline Phosphatase: 51 U/L (ref 38–126)
Anion gap: 9 (ref 5–15)
BUN: 17 mg/dL (ref 6–20)
CO2: 24 mmol/L (ref 22–32)
Calcium: 9.1 mg/dL (ref 8.9–10.3)
Chloride: 104 mmol/L (ref 98–111)
Creatinine, Ser: 1.01 mg/dL — ABNORMAL HIGH (ref 0.44–1.00)
GFR, Estimated: >60 mL/min (ref >60)
Glucose, Bld: 117 mg/dL — ABNORMAL HIGH (ref 70–99)
Potassium: 3 mmol/L — ABNORMAL LOW (ref 3.5–5.1)
Sodium: 137 mmol/L (ref 135–145)
Total Bilirubin: 0.9 mg/dL (ref 0.3–1.2)
Total Protein: 7.4 g/dL (ref 6.5–8.1)

## 2023-01-22 LAB — CBC
HCT: 36.4 % (ref 36.0–46.0)
Hemoglobin: 12.2 g/dL (ref 12.0–15.0)
MCH: 31 pg (ref 26.0–34.0)
MCHC: 33.5 g/dL (ref 30.0–36.0)
MCV: 92.6 fL (ref 80.0–100.0)
Platelets: 337 10*3/uL (ref 150–400)
RBC: 3.93 MIL/uL (ref 3.87–5.11)
RDW: 13.2 % (ref 11.5–15.5)
WBC: 7.8 10*3/uL (ref 4.0–10.5)
nRBC: 0 % (ref 0.0–0.2)

## 2023-01-22 LAB — HIV ANTIBODY (ROUTINE TESTING W REFLEX): HIV Screen 4th Generation wRfx: NONREACTIVE

## 2023-01-22 LAB — HEMOGLOBIN A1C
Hgb A1c MFr Bld: 6.3 % — ABNORMAL HIGH (ref 4.8–5.6)
Mean Plasma Glucose: 134.11 mg/dL

## 2023-01-22 MED ORDER — LORAZEPAM 2 MG/ML IJ SOLN
1.0000 mg | INTRAMUSCULAR | Status: DC | PRN
  Administered 2023-01-22: 1 mg via INTRAVENOUS
  Filled 2023-01-22: qty 1

## 2023-01-22 MED ORDER — STROKE: EARLY STAGES OF RECOVERY BOOK
Freq: Once | Status: AC
  Filled 2023-01-22 (×2): qty 1

## 2023-01-22 MED ORDER — SODIUM CHLORIDE 0.9 % IV SOLN: INTRAVENOUS | Status: DC

## 2023-01-22 MED ORDER — POTASSIUM CHLORIDE CRYS ER 20 MEQ PO TBCR
40.0000 meq | EXTENDED_RELEASE_TABLET | Freq: Once | ORAL | Status: AC
  Administered 2023-01-22: 40 meq via ORAL
  Filled 2023-01-22: qty 2

## 2023-01-22 MED ORDER — SENNOSIDES-DOCUSATE SODIUM 8.6-50 MG PO TABS: 1.0000 | ORAL_TABLET | Freq: Every evening | ORAL | Status: DC | PRN

## 2023-01-22 MED ORDER — HEPARIN SODIUM (PORCINE) 5000 UNIT/ML IJ SOLN
5000.0000 [IU] | Freq: Three times a day (TID) | INTRAMUSCULAR | Status: DC
  Administered 2023-01-22 – 2023-01-23 (×2): 5000 [IU] via SUBCUTANEOUS
  Filled 2023-01-22 (×2): qty 1

## 2023-01-22 MED ORDER — ACETAMINOPHEN 325 MG PO TABS: 650.0000 mg | ORAL_TABLET | ORAL | Status: DC | PRN

## 2023-01-22 MED ORDER — ATORVASTATIN CALCIUM 40 MG PO TABS
40.0000 mg | ORAL_TABLET | Freq: Every day | ORAL | Status: DC
  Administered 2023-01-22 – 2023-01-23 (×2): 40 mg via ORAL
  Filled 2023-01-22 (×2): qty 1

## 2023-01-22 MED ORDER — PERFLUTREN LIPID MICROSPHERE
1.0000 mL | INTRAVENOUS | Status: AC | PRN
  Administered 2023-01-22: 6 mL via INTRAVENOUS

## 2023-01-22 MED ORDER — ACETAMINOPHEN 650 MG RE SUPP: 650.0000 mg | RECTAL | Status: DC | PRN

## 2023-01-22 MED ORDER — ACETAMINOPHEN 160 MG/5ML PO SOLN: 650.0000 mg | ORAL | Status: DC | PRN

## 2023-01-22 NOTE — Progress Notes (Signed)
  Transition of Care [facility]) Screening Note   Patient Details  Name: Grace Valdez Date of Birth: 11/16/1965   Transition of Care [facility]) CM/SW Contact:    Villa Herb, LCSWA Phone Number: 01/22/2023, 9:17 AM    Transition of Care Department [facility]) has reviewed patient and no TOC needs have been identified at this time. We will continue to monitor patient advancement through interdisciplinary progression rounds. If new patient transition needs arise, please place a TOC consult.

## 2023-01-22 NOTE — ED Notes (Signed)
Hospitalist in to see pt.  Sister at beside.  They updated on POC and pt awaiting bed at [facility]

## 2023-01-22 NOTE — Progress Notes (Signed)
OT Cancellation Note  Patient Details Name: Grace Valdez MRN: [id number] DOB: 1966/05/19   Cancelled Treatment:    Reason Eval/Treat Not Completed: Other (comment). Pt reportedly transferring to [facility].   Leylany Nored OT, MOT   Danie Chandler 01/22/2023, 10:06 AM

## 2023-01-22 NOTE — Progress Notes (Signed)
Nutrition Brief Note  Consult received for prediabetes diet education.  Lab Results  Component Value Date   HGBA1C 6.3 (H) 01/22/2023   MRI brain pending (requires transfer to [facility] for this) and subsequent formal stroke neurology evaluation planned subsequently.   Patient currently in [facility] ED. Diet education handouts added to AVS. Will place referral for outpatient nutrition education pending ongoing workup and resolution of acute illness.   Wt Readings from Last 15 Encounters:  01/21/23 88.5 kg  10/02/22 88 kg  09/10/22 88 kg  07/07/22 88.1 kg  01/21/22 89.4 kg  11/12/21 89.1 kg   Body mass index is 32.45 kg/m. Patient meets criteria for obesity based on current BMI.   Current diet order is Heart Healthy, no documented meal completions on file at this time. Labs and medications reviewed.   If additional nutrition issues arise, please re-consult RD.   Drusilla Kanner, RDN, LDN Clinical Nutrition

## 2023-01-22 NOTE — Discharge Instructions (Signed)

## 2023-01-22 NOTE — Assessment & Plan Note (Signed)
-   CTA showed no acute findings - Neuro was consulted and recommended transfer to [facility] for TIA workup - Echo in the a.m. - MRI upon arrival to [facility] - PT/OT/ST eval and treat - Patient is currently at baseline - Continue to monitor

## 2023-01-22 NOTE — Assessment & Plan Note (Addendum)
-   Holding antihypertensives for permissive hypertension given TIA - Continue to monitor

## 2023-01-22 NOTE — H&P (Signed)
History and Physical    Patient: Grace Valdez:[id number] DOB: 1965/11/06 DOA: 01/21/2023 DOS: the patient was seen and examined on 01/22/2023 PCP: Kerri Perches, MD  Patient coming from: Home  Chief Complaint:  Chief Complaint  Patient presents with   Altered Mental Status   HPI: Grace Valdez is a 57 y.o. female with medical history significant of hypertension presents to the ED with a chief complaint of altered mental status.  Patient is irritable and that is worse when pushing for details of her history.  She reports that she was talking to her cousin on the phone her cousin said she was acting confused so she called her sister her sister took her to urgent care.  At the urgent care she was advised to come to the ER to rule out stroke.  She reports no asymmetrical weakness, and no generalized weakness.  She had no difficulty speaking.  She had no change in vision, numbness, chest pain, palpitations, facial droop, drooling, difficulty swallowing.  She reports that her cousin had asked her question and she thinks she was just rushing around to get ready for work she did not insert the way the cousin had expected that resulted in the perception that she was confused.  Earlier though, she told the ED provider she did not remember the episode.  It was reported by the ED provider that the episode lasted 2-3 hours, but patient will not admit that there was any episode at this point.  There is a sister at bedside at the time of my exam but is not the same sister who took her to the urgent care apparently.  Patient is upset about staying in the hospital because she was post to go out of time today to see her daughter in [place].  Patient reports otherwise she is in completely her baseline state of health and has no other complaints.  Patient does not smoke.  She reports she drinks a glass of wine regularly but not every day.  She has never had withdrawal.  She does not use illicit drugs.  She is  vaccinated for COVID.  Patient is full code. Review of Systems: As mentioned in the history of present illness. All other systems reviewed and are negative. Past Medical History:  Diagnosis Date   Hypertension    Past Surgical History:  Procedure Laterality Date   CESAREAN SECTION  1992   COLONOSCOPY WITH PROPOFOL N/A 11/19/2020   Procedure: COLONOSCOPY WITH PROPOFOL;  Surgeon: Dolores Frame, MD;  Location: [facility];  Service: Gastroenterology;  Laterality: N/A;  AM   POLYPECTOMY  11/19/2020   Procedure: POLYPECTOMY;  Surgeon: Dolores Frame, MD;  Location: [facility];  Service: Gastroenterology;;   Social History:  reports that she has been smoking cigarettes and cigars. She has never used smokeless tobacco. She reports current alcohol use. She reports that she does not use drugs.  Allergies  Allergen Reactions   Phentermine Nausea And Vomiting    History reviewed. No pertinent family history.  Prior to Admission medications   Medication Sig Start Date End Date Taking? Authorizing Provider  benzonatate (TESSALON) 100 MG capsule Take 1 capsule (100 mg total) by mouth 2 (two) times daily as needed for cough. 09/10/22   Kerri Perches, MD  Calcium Carb-Cholecalciferol (CALTRATE 600+D3) 600-20 MG-MCG TABS Yake one tablet by mouth twice daily for bone and muscle health 07/07/22   Kerri Perches, MD  fluticasone (FLONASE SENSIMIST) 27.5 MCG/SPRAY  nasal spray Place 2 sprays into the nose daily. 07/07/22   Kerri Perches, MD  loratadine (EQ ALLERGY RELIEF) 10 MG tablet TAKE 1 TABLET BY MOUTH ONCE DAILY 12/24/22   Kerri Perches, MD  promethazine-dextromethorphan (PROMETHAZINE-DM) 6.25-15 MG/5ML syrup Take one teaspoon by mouth at bedtime, as needed, for cough 09/10/22   Kerri Perches, MD  triamterene-hydrochlorothiazide (MAXZIDE-25) 37.5-25 MG tablet Take 1 tablet by mouth daily. 07/07/22   Kerri Perches, MD    Physical  Exam: Vitals:   01/22/23 0130 01/22/23 0200 01/22/23 0230 01/22/23 0300  BP: 135/73 121/72 124/68 116/67  Pulse: 62 66 61 (!) 59  Resp:   18 13  Temp:      SpO2: 95% 95% 98% 97%  Weight:       1.  General: Patient lying supine in bed,  no acute distress   2. Psychiatric: Alert and oriented x 3, mood and behavior normal for situation, pleasant and cooperative with exam   3. Neurologic: Speech and language are normal, face is symmetric, moves all 4 extremities voluntarily, at baseline without acute deficits on limited exam   4. HEENMT:  Head is atraumatic, normocephalic, pupils reactive to light, neck is supple, trachea is midline, mucous membranes are moist   5. Respiratory : Lungs are clear to auscultation bilaterally without wheezing, rhonchi, rales, no cyanosis, no increase in work of breathing or accessory muscle use   6. Cardiovascular : Heart rate normal, rhythm is regular, no murmurs, rubs or gallops, no peripheral edema, peripheral pulses palpated   7. Gastrointestinal:  Abdomen is soft, nondistended, nontender to palpation bowel sounds active, no masses or organomegaly palpated   8. Skin:  Skin is warm, dry and intact without rashes, acute lesions, or ulcers on limited exam   9.Musculoskeletal:  No acute deformities or trauma, no asymmetry in tone, no peripheral edema, peripheral pulses palpated, no tenderness to palpation in the extremities  Data Reviewed: In the ED Afebrile, heart rate normal, respiratory rate within normal limits, blood pressure 126/62-161/89, satting 95-100% No leukocytosis, hemoglobin stable Chemistry is unremarkable UA is not indicative of UTI CTA of the head is without acute findings Neuro recommends transfer to [facility] for TIA workup  Assessment and Plan: * TIA (transient ischemic attack) - CTA showed no acute findings - Neuro was consulted and recommended transfer to [facility] for TIA workup - Echo in the a.m. - MRI upon arrival  to [facility] - PT/OT/ST eval and treat - Patient is currently at baseline - Continue to monitor  Dyslipidemia, goal LDL below 100 - Lipid panel in the a.m. - Will likely need to start statin  Essential hypertension - Holding antihypertensives for permissive hypertension given TIA - Continue to monitor      Advance Care Planning:   Code Status: Full Code  Consults: Neuro  Family Communication: Sister at bedside  Severity of Illness: The appropriate patient status for this patient is OBSERVATION. Observation status is judged to be reasonable and necessary in order to provide the required intensity of service to ensure the patient's safety. The patient's presenting symptoms, physical exam findings, and initial radiographic and laboratory data in the context of their medical condition is felt to place them at decreased risk for further clinical deterioration. Furthermore, it is anticipated that the patient will be medically stable for discharge from the hospital within 2 midnights of admission.   Author: Lilyan Gilford, DO 01/22/2023 4:46 AM  For on call review [email or web address].

## 2023-01-22 NOTE — Progress Notes (Signed)
[facility] PROGRESS NOTE  Grace Valdez (DOB: 03/09/1966) WUJ:[id number] PCP: Kerri Perches, MD  Brief Narrative: Grace Valdez is a 57 y.o. female with a history of HTN who presented to the ED last night with some confusion, having been referred from urgent care for concern of stroke. CTA head and neck were unremarkable. Neurology was consulted and recommended brain MRI and formal consultation pending that. She was admitted with plans for transfer to [facility] earlier this morning.   Subjective: Feels fine, sister at bedside says she's returned to her mental baseline. No numbness, weakness, speech difficulties or confusion currently.  Objective: BP (!) 115/52   Pulse 63   Temp 97.9 F (36.6 C) (Oral)   Resp (!) 27   Wt 88.5 kg   LMP 01/24/2013   SpO2 95%   BMI 32.45 kg/m   Gen: No distress Pulm: Clear, nonlabored  CV: RRR, no MRG GI: Soft, NT, ND, +BS Neuro: Alert and oriented. No new focal deficits. Ext: Warm, no deformities. Skin: No rashes, lesions or ulcers on visualized skin   Assessment & Plan: Transient confusion: Now resolved, episode reportedly 2-3 hours yesterday. CT H/N unremarkable.  - MRI brain pending (requires transfer to [facility] for this) and subsequent formal stroke neurology evaluation planned subsequently. - PT/OT/SLP consulted per routine.  - NSR on telemetry and ECG - LDL is 148. Will start statin. HbA1c 6.3%, could consider metformin. Will consult RD and recommend PCP follow up for prediabetes.  - Allowing permissive HTN for now.  Holding triamterene-HCTZ.  - Body mass index is 32.45 kg/m. Has allergy to phentermine listed, may be candidate for GLP1 agonist. Defer to PCP.  - K 3.0, supplement this morning.   Tyrone Nine, MD [facility] [email or web address] 01/22/2023, 10:29 AM

## 2023-01-22 NOTE — Assessment & Plan Note (Signed)
-   Lipid panel in the a.m. - Will likely need to start statin

## 2023-01-23 DIAGNOSIS — G459 Transient cerebral ischemic attack, unspecified: Secondary | ICD-10-CM | POA: Diagnosis not present

## 2023-01-23 LAB — RAPID URINE DRUG SCREEN, HOSP PERFORMED
Amphetamines: NOT DETECTED
Barbiturates: NOT DETECTED
Benzodiazepines: POSITIVE — AB
Cocaine: NOT DETECTED
Opiates: NOT DETECTED
Tetrahydrocannabinol: NOT DETECTED

## 2023-01-23 MED ORDER — CLOPIDOGREL BISULFATE 75 MG PO TABS: 75.0000 mg | ORAL_TABLET | Freq: Every day | ORAL | 0 refills | Status: DC

## 2023-01-23 MED ORDER — ASPIRIN 81 MG PO TBEC
81.0000 mg | DELAYED_RELEASE_TABLET | Freq: Every day | ORAL | Status: DC
  Administered 2023-01-23: 81 mg via ORAL
  Filled 2023-01-23: qty 1

## 2023-01-23 MED ORDER — CLOPIDOGREL BISULFATE 75 MG PO TABS
75.0000 mg | ORAL_TABLET | Freq: Every day | ORAL | Status: DC
  Administered 2023-01-23: 75 mg via ORAL
  Filled 2023-01-23: qty 1

## 2023-01-23 MED ORDER — ATORVASTATIN CALCIUM 40 MG PO TABS: 40.0000 mg | ORAL_TABLET | Freq: Every day | ORAL | 1 refills | Status: DC

## 2023-01-23 MED ORDER — ASPIRIN 81 MG PO TBEC: 81.0000 mg | DELAYED_RELEASE_TABLET | Freq: Every day | ORAL | 12 refills | Status: AC

## 2023-01-23 NOTE — Discharge Summary (Signed)
PATIENT DETAILS Name: Grace Valdez Age: 57 y.o. Sex: female Date of Birth: [birth year] MRN: [id number]. Admitting Physician: Lilyan Gilford, DO WUJ:[id number], Milus Mallick, MD  Admit Date: 01/21/2023 Discharge date: 01/23/2023  Recommendations for Outpatient Follow-up:  Follow up with PCP in 1-2 weeks Please obtain CMP/CBC in one week  Admitted From:  Home  Disposition: Home   Discharge Condition: good  CODE STATUS:   Code Status: Full Code   Diet recommendation:  Diet Order             Diet - low sodium heart healthy           Diet Carb Modified           Diet Heart Room service appropriate? Yes; Fluid consistency: Thin  Diet effective now                    Brief Summary: 57 year old with history of HTN, prediabetes who presented with an episode of transient confusion-she was initially seen at [facility] ED-and transferred to [facility] for further evaluation.  Brief Hospital Course: TIA Back to baseline MRI brain/CT angio-negative for stroke/stenosis/LVO LDL 148-will be started on statin A1c essentially unchanged from prior-6.3-patient will intensify diet/exercise regimen-does not want to be started on medications yet Evaluated by neurology-recommendations aspirin/Plavix x 3 weeks followed by aspirin alone.  Rest of medical issues were stable during discharge overnight hospitalization.  Obesity: Estimated body mass index is 32.45 kg/m as calculated from the following:   Height as of 10/02/22: 5\' 5"  (1.651 m).   Weight as of this encounter: 88.5 kg.    Discharge Diagnoses:  Principal Problem:   TIA (transient ischemic attack) Active Problems:   Essential hypertension   Dyslipidemia, goal LDL below 100   Discharge Instructions:  Activity:  As tolerated    Discharge Instructions     Amb Referral to Nutrition and Diabetic Education   Complete by: As directed    Ambulatory referral to Neurology   Complete by: As directed    An appointment is  requested in approximately: 8 weeks   Call MD for:  difficulty breathing, headache or visual disturbances   Complete by: As directed    Call MD for:  extreme fatigue   Complete by: As directed    Call MD for:  persistant dizziness or light-headedness   Complete by: As directed    Diet - low sodium heart healthy   Complete by: As directed    Diet Carb Modified   Complete by: As directed    Discharge instructions   Complete by: As directed    Follow with Primary MD  Kerri Perches, MD in 1-2 weeks  Follow-up stroke clinic-the office will give you a call  Aspirin/Plavix x 3 weeks-after 3 weeks stop Plavix and continue on aspirin  You have been started on a cholesterol medication-please continue this medication, ask your primary care practitioner for further refills  You continue to have prediabetes-please intensify her exercise/dietary regimen-and follow-up with your primary care practitioner.  You may need to be started on a diabetic medication if your A1c is still elevated in the near future.  Please get a complete blood count and chemistry panel checked by your Primary MD at your next visit, and again as instructed by your Primary MD.  Get Medicines reviewed and adjusted: Please take all your medications with you for your next visit with your Primary MD  Laboratory/radiological data: Please request your Primary MD to go over  all hospital tests and procedure/radiological results at the follow up, please ask your Primary MD to get all Hospital records sent to his/her office.  In some cases, they will be blood work, cultures and biopsy results pending at the time of your discharge. Please request that your primary care M.D. follows up on these results.  Also Note the following: If you experience worsening of your admission symptoms, develop shortness of breath, life threatening emergency, suicidal or homicidal thoughts you must seek medical attention immediately by calling 911 or  calling your MD immediately  if symptoms less severe.  You must read complete instructions/literature along with all the possible adverse reactions/side effects for all the Medicines you take and that have been prescribed to you. Take any new Medicines after you have completely understood and accpet all the possible adverse reactions/side effects.   Do not drive when taking Pain medications or sleeping medications (Benzodaizepines)  Do not take more than prescribed Pain, Sleep and Anxiety Medications. It is not advisable to combine anxiety,sleep and pain medications without talking with your primary care practitioner  Special Instructions: If you have smoked or chewed Tobacco  in the last 2 yrs please stop smoking, stop any regular Alcohol  and or any Recreational drug use.  Wear Seat belts while driving.  Please note: You were cared for by a hospitalist during your hospital stay. Once you are discharged, your primary care physician will handle any further medical issues. Please note that NO REFILLS for any discharge medications will be authorized once you are discharged, as it is imperative that you return to your primary care physician (or establish a relationship with a primary care physician if you do not have one) for your post hospital discharge needs so that they can reassess your need for medications and monitor your lab values.   Increase activity slowly   Complete by: As directed       Allergies as of 01/23/2023       Reactions   Phentermine Nausea And Vomiting        Medication List     STOP taking these medications    promethazine-dextromethorphan 6.25-15 MG/5ML syrup Commonly known as: PROMETHAZINE-DM       TAKE these medications    aspirin EC 81 MG tablet Take 1 tablet (81 mg total) by mouth daily. Swallow whole. Start taking on: Jan 24, 2023   atorvastatin 40 MG tablet Commonly known as: LIPITOR Take 1 tablet (40 mg total) by mouth daily. Start taking on:  Jan 24, 2023   benzonatate 100 MG capsule Commonly known as: TESSALON Take 1 capsule (100 mg total) by mouth 2 (two) times daily as needed for cough.   Caltrate 600+D3 600-20 MG-MCG Tabs Generic drug: Calcium Carb-Cholecalciferol Yake one tablet by mouth twice daily for bone and muscle health   clopidogrel 75 MG tablet Commonly known as: PLAVIX Take 1 tablet (75 mg total) by mouth daily. Start taking on: Jan 24, 2023   EQ Allergy Relief 10 MG tablet Generic drug: loratadine TAKE 1 TABLET BY MOUTH ONCE DAILY   Flonase Sensimist 27.5 MCG/SPRAY nasal spray Generic drug: fluticasone Place 2 sprays into the nose daily.   triamterene-hydrochlorothiazide 37.5-25 MG tablet Commonly known as: MAXZIDE-25 Take 1 tablet by mouth daily.        Follow-up Information     Kerri Perches, MD. Schedule an appointment as soon as possible for a visit in 1 week(s).   Specialty: Family Medicine Contact information: [address] [place] [address] [phone]         [facility] Follow up.   Specialty: Neurology Why: Hospital follow up, Office will call with date/time, If you dont hear from them,please give them a call Contact information: [address] [place] [address] [phone]               Allergies  Allergen Reactions   Phentermine Nausea And Vomiting     Other Procedures/Studies: MR BRAIN WO CONTRAST  Result Date: 01/22/2023 CLINICAL DATA:  Transient ischemic attack EXAM: MRI HEAD WITHOUT CONTRAST TECHNIQUE: Multiplanar, multiecho pulse sequences of the brain and surrounding structures were obtained without intravenous contrast. COMPARISON:  None Available. FINDINGS: Brain: No acute infarct, mass effect or extra-axial collection. No acute or chronic hemorrhage. Minimal multifocal hyperintense T2-weight signal within the white matter. The midline structures are normal. Vascular: Major  flow voids are preserved. Skull and upper cervical spine: Normal calvarium and skull base. Visualized upper cervical spine and soft tissues are normal. Sinuses/Orbits:No paranasal sinus fluid levels or advanced mucosal thickening. No mastoid or middle ear effusion. Normal orbits. IMPRESSION: 1. No acute intracranial abnormality. 2. Minimal multifocal hyperintense T2-weight signal within the white matter, nonspecific but may be seen in the setting of migraine headaches or early chronic small vessel ischemia. Electronically Signed   By: Deatra Robinson M.D.   On: 01/22/2023 20:27   ECHOCARDIOGRAM COMPLETE  Result Date: 01/22/2023    ECHOCARDIOGRAM REPORT   Patient Name:   Grace Valdez Date of Exam: 01/22/2023 Medical Rec #:  [id number]      Height:       65.0 in Accession #:    [id number]     Weight:       195.0 lb Date of Birth:  07/05/1966      BSA:          1.957 m Patient Age:    57 years       BP:           115/52 mmHg Patient Gender: F              HR:           64 bpm. [facility] Procedure: 2D Echo, 3D Echo, Cardiac Doppler, Color Doppler and Intracardiac            Opacification Agent Indications:    TIA G45.9  History:        Patient has no prior history of Echocardiogram examinations.                 TIA; Risk Factors:Hypertension, Dyslipidemia and Current Smoker.  Sonographer:    Aron Baba Referring Phys: [id number] ASIA B ZIERLE-GHOSH  Sonographer Comments: Image acquisition challenging due to patient body habitus and Image acquisition challenging due to respiratory motion. IMPRESSIONS  1. Left ventricular ejection fraction, by estimation, is 55 to 60%. The left ventricle has normal function. The left ventricle has no regional wall motion abnormalities. Left ventricular diastolic parameters were normal.  2. Right ventricular systolic function is normal. The right ventricular size is normal. Tricuspid regurgitation signal is inadequate for assessing PA pressure.  3. The mitral valve is  grossly normal. Trivial mitral valve regurgitation.  4. The aortic valve is tricuspid. Aortic valve regurgitation is not visualized.  5. The inferior vena cava is normal in size with greater than 50% respiratory variability, suggesting right atrial pressure of 3 mmHg.  Comparison(s): No prior Echocardiogram. FINDINGS  Left Ventricle: Left ventricular ejection fraction, by estimation, is 55 to 60%. The left ventricle has normal function. The left ventricle has no regional wall motion abnormalities. Definity contrast agent was given IV to delineate the left ventricular  endocardial borders. The left ventricular internal cavity size was normal in size. There is borderline left ventricular hypertrophy. Left ventricular diastolic parameters were normal. Right Ventricle: The right ventricular size is normal. No increase in right ventricular wall thickness. Right ventricular systolic function is normal. Tricuspid regurgitation signal is inadequate for assessing PA pressure. The tricuspid regurgitant velocity is 1.30 m/s, and with an assumed right atrial pressure of 3 mmHg, the estimated right ventricular systolic pressure is 9.8 mmHg. Left Atrium: Left atrial size was normal in size. Right Atrium: Right atrial size was normal in size. Pericardium: Trivial pericardial effusion is present. The pericardial effusion is circumferential. Mitral Valve: The mitral valve is grossly normal. Trivial mitral valve regurgitation. Tricuspid Valve: The tricuspid valve is grossly normal. Tricuspid valve regurgitation is mild. Aortic Valve: The aortic valve is tricuspid. Aortic valve regurgitation is not visualized. Pulmonic Valve: The pulmonic valve was grossly normal. Pulmonic valve regurgitation is trivial. Aorta: The aortic root is normal in size and structure. Venous: The inferior vena cava is normal in size with greater than 50% respiratory variability, suggesting right atrial pressure of 3 mmHg. IAS/Shunts: No atrial level shunt  detected by color flow Doppler.  LEFT VENTRICLE PLAX 2D LVIDd:         4.60 cm   Diastology LVIDs:         2.60 cm   LV e' medial:    10.30 cm/s LV PW:         1.10 cm   LV E/e' medial:  10.4 LV IVS:        0.90 cm   LV e' lateral:   13.60 cm/s LVOT diam:     1.90 cm   LV E/e' lateral: 7.9 LV SV:         77 LV SV Index:   39 LVOT Area:     2.84 cm                           3D Volume EF:                          3D EF:        56 %                          LV EDV:       147 ml                          LV ESV:       65 ml                          LV SV:        82 ml RIGHT VENTRICLE RV S prime:     14.80 cm/s TAPSE (M-mode): 2.6 cm LEFT ATRIUM             Index        RIGHT ATRIUM           Index LA diam:        3.00 cm 1.53 cm/m  RA Area:     21.20 cm LA Vol (A2C):   56.0 ml 28.62 ml/m  RA Volume:   69.50 ml  35.51 ml/m LA Vol (A4C):   42.8 ml 21.87 ml/m LA Biplane Vol: 53.4 ml 27.29 ml/m  AORTIC VALVE LVOT Vmax:   116.00 cm/s LVOT Vmean:  84.100 cm/s LVOT VTI:    0.272 m  AORTA Ao Root diam: 3.00 cm Ao Asc diam:  3.20 cm MITRAL VALVE                TRICUSPID VALVE MV Area (PHT): 4.80 cm     TR Peak grad:   6.8 mmHg MV Decel Time: 158 msec     TR Vmax:        130.00 cm/s MR Peak grad: 7.6 mmHg MR Vmax:      137.50 cm/s   SHUNTS MV E velocity: 107.00 cm/s  Systemic VTI:  0.27 m MV A velocity: 70.80 cm/s   Systemic Diam: 1.90 cm MV E/A ratio:  1.51 Nona Dell MD Electronically signed by Nona Dell MD Signature Date/Time: 01/22/2023/1:30:41 PM    Final    CT ANGIO HEAD NECK W WO CM  Result Date: 01/21/2023 CLINICAL DATA:  Forgetfulness.  Altered mental status. EXAM: CT ANGIOGRAPHY HEAD AND NECK WITH AND WITHOUT CONTRAST TECHNIQUE: Multidetector CT imaging of the head and neck was performed using the standard protocol during bolus administration of intravenous contrast. Multiplanar CT image reconstructions and MIPs were obtained to evaluate the vascular anatomy. Carotid stenosis measurements (when  applicable) are obtained utilizing NASCET criteria, using the distal internal carotid diameter as the denominator. RADIATION DOSE REDUCTION: This exam was performed according to the departmental dose-optimization program which includes automated exposure control, adjustment of the mA and/or kV according to patient size and/or use of iterative reconstruction technique. CONTRAST:  75mL OMNIPAQUE IOHEXOL 350 MG/ML SOLN COMPARISON:  None Available. FINDINGS: CT HEAD FINDINGS Brain: There is no mass, hemorrhage or extra-axial collection. The size and configuration of the ventricles and extra-axial CSF spaces are normal. There is no acute or chronic infarction. The brain parenchyma is normal. Skull: The visualized skull base, calvarium and extracranial soft tissues are normal. Sinuses/Orbits: No fluid levels or advanced mucosal thickening of the visualized paranasal sinuses. No mastoid or middle ear effusion. The orbits are normal. CTA NECK FINDINGS SKELETON: There is no bony spinal canal stenosis. No lytic or blastic lesion. OTHER NECK: Normal pharynx, larynx and major salivary glands. No cervical lymphadenopathy. Unremarkable thyroid gland. UPPER CHEST: No pneumothorax or pleural effusion. No nodules or masses. AORTIC ARCH: There is no calcific atherosclerosis of the aortic arch. There is no aneurysm, dissection or hemodynamically significant stenosis of the visualized portion of the aorta. Normal variant aortic arch branching pattern with the brachiocephalic and left common carotid arteries sharing a common origin. The visualized proximal subclavian arteries are widely patent. RIGHT CAROTID SYSTEM: Normal without aneurysm, dissection or stenosis. LEFT CAROTID SYSTEM: Normal without aneurysm, dissection or stenosis. VERTEBRAL ARTERIES: Left dominant configuration. Both origins are clearly patent. There is no dissection, occlusion or flow-limiting stenosis to the skull base (V1-V3 segments). CTA HEAD FINDINGS POSTERIOR  CIRCULATION: --Vertebral arteries: Normal V4 segments. --Inferior cerebellar arteries: Normal. --Basilar artery: Normal. --Superior cerebellar arteries: Normal. --Posterior cerebral arteries (PCA): Normal. ANTERIOR CIRCULATION: --Intracranial internal carotid arteries: Normal. --Anterior cerebral arteries (ACA): Normal. Both A1 segments are present. Patent anterior communicating artery (a-comm). --Middle cerebral arteries (MCA): Normal. VENOUS SINUSES: As permitted by contrast timing, patent. ANATOMIC VARIANTS: None  Review of the MIP images confirms the above findings. IMPRESSION: Normal CTA of the head and neck. Electronically Signed   By: Deatra Robinson M.D.   On: 01/21/2023 23:38   MM 3D SCREENING MAMMOGRAM BILATERAL BREAST  Result Date: 12/31/2022 CLINICAL DATA:  Screening. EXAM: DIGITAL SCREENING BILATERAL MAMMOGRAM WITH TOMOSYNTHESIS AND CAD TECHNIQUE: Bilateral screening digital craniocaudal and mediolateral oblique mammograms were obtained. Bilateral screening digital breast tomosynthesis was performed. The images were evaluated with computer-aided detection. COMPARISON:  Previous exam(s). ACR Breast Density Category b: There are scattered areas of fibroglandular density. FINDINGS: There are no findings suspicious for malignancy. IMPRESSION: No mammographic evidence of malignancy. A result letter of this screening mammogram will be mailed directly to the patient. RECOMMENDATION: Screening mammogram in one year. (Code:SM-B-01Y) BI-RADS CATEGORY  1: Negative. Electronically Signed   By: Elberta Fortis M.D.   On: 12/31/2022 09:36     TODAY-DAY OF DISCHARGE:  Subjective:   Grace Valdez today has no headache,no chest abdominal pain,no new weakness tingling or numbness, feels much better wants to go home today.   Objective:   Blood pressure 111/67, pulse 76, temperature 97.6 F (36.4 C), temperature source Oral, resp. rate 15, weight 88.5 kg, last menstrual period 01/24/2013, SpO2 94 %. No intake or  output data in the 24 hours ending 01/23/23 0844 Filed Weights   01/21/23 2033  Weight: 88.5 kg    Exam: Awake Alert, Oriented *3, No new F.N deficits, Normal affect [place].AT,PERRAL Supple Neck,No JVD, No cervical lymphadenopathy appriciated.  Symmetrical Chest wall movement, Good air movement bilaterally, CTAB RRR,No Gallops,Rubs or new Murmurs, No Parasternal Heave +ve B.Sounds, Abd Soft, Non tender, No organomegaly appriciated, No rebound -guarding or rigidity. No Cyanosis, Clubbing or edema, No new Rash or bruise   PERTINENT RADIOLOGIC STUDIES: MR BRAIN WO CONTRAST  Result Date: 01/22/2023 CLINICAL DATA:  Transient ischemic attack EXAM: MRI HEAD WITHOUT CONTRAST TECHNIQUE: Multiplanar, multiecho pulse sequences of the brain and surrounding structures were obtained without intravenous contrast. COMPARISON:  None Available. FINDINGS: Brain: No acute infarct, mass effect or extra-axial collection. No acute or chronic hemorrhage. Minimal multifocal hyperintense T2-weight signal within the white matter. The midline structures are normal. Vascular: Major flow voids are preserved. Skull and upper cervical spine: Normal calvarium and skull base. Visualized upper cervical spine and soft tissues are normal. Sinuses/Orbits:No paranasal sinus fluid levels or advanced mucosal thickening. No mastoid or middle ear effusion. Normal orbits. IMPRESSION: 1. No acute intracranial abnormality. 2. Minimal multifocal hyperintense T2-weight signal within the white matter, nonspecific but may be seen in the setting of migraine headaches or early chronic small vessel ischemia. Electronically Signed   By: Deatra Robinson M.D.   On: 01/22/2023 20:27   ECHOCARDIOGRAM COMPLETE  Result Date: 01/22/2023    ECHOCARDIOGRAM REPORT   Patient Name:   Emunah P Scannell Date of Exam: 01/22/2023 Medical Rec #:  [id number]      Height:       65.0 in Accession #:    [id number]     Weight:       195.0 lb Date of Birth:  18-Jul-1966      BSA:           1.957 m Patient Age:    57 years       BP:           115/52 mmHg Patient Gender: F              HR:  64 bpm. [facility] Procedure: 2D Echo, 3D Echo, Cardiac Doppler, Color Doppler and Intracardiac            Opacification Agent Indications:    TIA G45.9  History:        Patient has no prior history of Echocardiogram examinations.                 TIA; Risk Factors:Hypertension, Dyslipidemia and Current Smoker.  Sonographer:    Aron Baba Referring Phys: [id number] ASIA B ZIERLE-GHOSH  Sonographer Comments: Image acquisition challenging due to patient body habitus and Image acquisition challenging due to respiratory motion. IMPRESSIONS  1. Left ventricular ejection fraction, by estimation, is 55 to 60%. The left ventricle has normal function. The left ventricle has no regional wall motion abnormalities. Left ventricular diastolic parameters were normal.  2. Right ventricular systolic function is normal. The right ventricular size is normal. Tricuspid regurgitation signal is inadequate for assessing PA pressure.  3. The mitral valve is grossly normal. Trivial mitral valve regurgitation.  4. The aortic valve is tricuspid. Aortic valve regurgitation is not visualized.  5. The inferior vena cava is normal in size with greater than 50% respiratory variability, suggesting right atrial pressure of 3 mmHg. Comparison(s): No prior Echocardiogram. FINDINGS  Left Ventricle: Left ventricular ejection fraction, by estimation, is 55 to 60%. The left ventricle has normal function. The left ventricle has no regional wall motion abnormalities. Definity contrast agent was given IV to delineate the left ventricular  endocardial borders. The left ventricular internal cavity size was normal in size. There is borderline left ventricular hypertrophy. Left ventricular diastolic parameters were normal. Right Ventricle: The right ventricular size is normal. No increase in right ventricular wall thickness. Right  ventricular systolic function is normal. Tricuspid regurgitation signal is inadequate for assessing PA pressure. The tricuspid regurgitant velocity is 1.30 m/s, and with an assumed right atrial pressure of 3 mmHg, the estimated right ventricular systolic pressure is 9.8 mmHg. Left Atrium: Left atrial size was normal in size. Right Atrium: Right atrial size was normal in size. Pericardium: Trivial pericardial effusion is present. The pericardial effusion is circumferential. Mitral Valve: The mitral valve is grossly normal. Trivial mitral valve regurgitation. Tricuspid Valve: The tricuspid valve is grossly normal. Tricuspid valve regurgitation is mild. Aortic Valve: The aortic valve is tricuspid. Aortic valve regurgitation is not visualized. Pulmonic Valve: The pulmonic valve was grossly normal. Pulmonic valve regurgitation is trivial. Aorta: The aortic root is normal in size and structure. Venous: The inferior vena cava is normal in size with greater than 50% respiratory variability, suggesting right atrial pressure of 3 mmHg. IAS/Shunts: No atrial level shunt detected by color flow Doppler.  LEFT VENTRICLE PLAX 2D LVIDd:         4.60 cm   Diastology LVIDs:         2.60 cm   LV e' medial:    10.30 cm/s LV PW:         1.10 cm   LV E/e' medial:  10.4 LV IVS:        0.90 cm   LV e' lateral:   13.60 cm/s LVOT diam:     1.90 cm   LV E/e' lateral: 7.9 LV SV:         77 LV SV Index:   39 LVOT Area:     2.84 cm  3D Volume EF:                          3D EF:        56 %                          LV EDV:       147 ml                          LV ESV:       65 ml                          LV SV:        82 ml RIGHT VENTRICLE RV S prime:     14.80 cm/s TAPSE (M-mode): 2.6 cm LEFT ATRIUM             Index        RIGHT ATRIUM           Index LA diam:        3.00 cm 1.53 cm/m   RA Area:     21.20 cm LA Vol (A2C):   56.0 ml 28.62 ml/m  RA Volume:   69.50 ml  35.51 ml/m LA Vol (A4C):   42.8 ml 21.87 ml/m LA  Biplane Vol: 53.4 ml 27.29 ml/m  AORTIC VALVE LVOT Vmax:   116.00 cm/s LVOT Vmean:  84.100 cm/s LVOT VTI:    0.272 m  AORTA Ao Root diam: 3.00 cm Ao Asc diam:  3.20 cm MITRAL VALVE                TRICUSPID VALVE MV Area (PHT): 4.80 cm     TR Peak grad:   6.8 mmHg MV Decel Time: 158 msec     TR Vmax:        130.00 cm/s MR Peak grad: 7.6 mmHg MR Vmax:      137.50 cm/s   SHUNTS MV E velocity: 107.00 cm/s  Systemic VTI:  0.27 m MV A velocity: 70.80 cm/s   Systemic Diam: 1.90 cm MV E/A ratio:  1.51 Nona Dell MD Electronically signed by Nona Dell MD Signature Date/Time: 01/22/2023/1:30:41 PM    Final    CT ANGIO HEAD NECK W WO CM  Result Date: 01/21/2023 CLINICAL DATA:  Forgetfulness.  Altered mental status. EXAM: CT ANGIOGRAPHY HEAD AND NECK WITH AND WITHOUT CONTRAST TECHNIQUE: Multidetector CT imaging of the head and neck was performed using the standard protocol during bolus administration of intravenous contrast. Multiplanar CT image reconstructions and MIPs were obtained to evaluate the vascular anatomy. Carotid stenosis measurements (when applicable) are obtained utilizing NASCET criteria, using the distal internal carotid diameter as the denominator. RADIATION DOSE REDUCTION: This exam was performed according to the departmental dose-optimization program which includes automated exposure control, adjustment of the mA and/or kV according to patient size and/or use of iterative reconstruction technique. CONTRAST:  75mL OMNIPAQUE IOHEXOL 350 MG/ML SOLN COMPARISON:  None Available. FINDINGS: CT HEAD FINDINGS Brain: There is no mass, hemorrhage or extra-axial collection. The size and configuration of the ventricles and extra-axial CSF spaces are normal. There is no acute or chronic infarction. The brain parenchyma is normal. Skull: The visualized skull base, calvarium and extracranial soft tissues are normal. Sinuses/Orbits: No fluid levels or advanced mucosal thickening of the visualized paranasal  sinuses. No  mastoid or middle ear effusion. The orbits are normal. CTA NECK FINDINGS SKELETON: There is no bony spinal canal stenosis. No lytic or blastic lesion. OTHER NECK: Normal pharynx, larynx and major salivary glands. No cervical lymphadenopathy. Unremarkable thyroid gland. UPPER CHEST: No pneumothorax or pleural effusion. No nodules or masses. AORTIC ARCH: There is no calcific atherosclerosis of the aortic arch. There is no aneurysm, dissection or hemodynamically significant stenosis of the visualized portion of the aorta. Normal variant aortic arch branching pattern with the brachiocephalic and left common carotid arteries sharing a common origin. The visualized proximal subclavian arteries are widely patent. RIGHT CAROTID SYSTEM: Normal without aneurysm, dissection or stenosis. LEFT CAROTID SYSTEM: Normal without aneurysm, dissection or stenosis. VERTEBRAL ARTERIES: Left dominant configuration. Both origins are clearly patent. There is no dissection, occlusion or flow-limiting stenosis to the skull base (V1-V3 segments). CTA HEAD FINDINGS POSTERIOR CIRCULATION: --Vertebral arteries: Normal V4 segments. --Inferior cerebellar arteries: Normal. --Basilar artery: Normal. --Superior cerebellar arteries: Normal. --Posterior cerebral arteries (PCA): Normal. ANTERIOR CIRCULATION: --Intracranial internal carotid arteries: Normal. --Anterior cerebral arteries (ACA): Normal. Both A1 segments are present. Patent anterior communicating artery (a-comm). --Middle cerebral arteries (MCA): Normal. VENOUS SINUSES: As permitted by contrast timing, patent. ANATOMIC VARIANTS: None Review of the MIP images confirms the above findings. IMPRESSION: Normal CTA of the head and neck. Electronically Signed   By: Deatra Robinson M.D.   On: 01/21/2023 23:38     PERTINENT LAB RESULTS: CBC: Recent Labs    01/21/23 2057 01/22/23 0510  WBC 9.8 7.8  HGB 12.4 12.2  HCT 38.0 36.4  PLT 354 337   CMET CMP     Component Value  Date/Time   NA 137 01/22/2023 0510   NA 136 09/10/2022 0933   K 3.0 (L) 01/22/2023 0510   CL 104 01/22/2023 0510   CO2 24 01/22/2023 0510   GLUCOSE 117 (H) 01/22/2023 0510   BUN 17 01/22/2023 0510   BUN 20 09/10/2022 0933   CREATININE 1.01 (H) 01/22/2023 0510   CREATININE 0.95 11/06/2019 0933   CALCIUM 9.1 01/22/2023 0510   PROT 7.4 01/22/2023 0510   PROT 7.1 09/10/2022 0933   ALBUMIN 3.9 01/22/2023 0510   ALBUMIN 4.2 09/10/2022 0933   AST 18 01/22/2023 0510   ALT 18 01/22/2023 0510   ALKPHOS 51 01/22/2023 0510   BILITOT 0.9 01/22/2023 0510   BILITOT 0.2 09/10/2022 0933   GFRNONAA >60 01/22/2023 0510   GFRNONAA 67 11/06/2019 0933   GFRAA 67 11/05/2020 1143   GFRAA 78 11/06/2019 0933    GFR Estimated Creatinine Clearance: 65.9 mL/min (A) (by C-G formula based on SCr of 1.01 mg/dL (H)). No results for input(s): "LIPASE", "AMYLASE" in the last 72 hours. No results for input(s): "CKTOTAL", "CKMB", "CKMBINDEX", "TROPONINI" in the last 72 hours. Invalid input(s): "POCBNP" No results for input(s): "DDIMER" in the last 72 hours. Recent Labs    01/22/23 0510  HGBA1C 6.3*   Recent Labs    01/22/23 0510  CHOL 221*  HDL 60  LDLCALC 148*  TRIG 66  CHOLHDL 3.7   No results for input(s): "TSH", "T4TOTAL", "T3FREE", "THYROIDAB" in the last 72 hours.  Invalid input(s): "FREET3" No results for input(s): "VITAMINB12", "FOLATE", "FERRITIN", "TIBC", "IRON", "RETICCTPCT" in the last 72 hours. Coags: No results for input(s): "INR" in the last 72 hours.  Invalid input(s): "PT" Microbiology: No results found for this or any previous visit (from the past 240 hour(s)).  FURTHER DISCHARGE INSTRUCTIONS:  Get Medicines reviewed and adjusted: Please take  all your medications with you for your next visit with your Primary MD  Laboratory/radiological data: Please request your Primary MD to go over all hospital tests and procedure/radiological results at the follow up, please ask your  Primary MD to get all Hospital records sent to his/her office.  In some cases, they will be blood work, cultures and biopsy results pending at the time of your discharge. Please request that your primary care M.D. goes through all the records of your hospital data and follows up on these results.  Also Note the following: If you experience worsening of your admission symptoms, develop shortness of breath, life threatening emergency, suicidal or homicidal thoughts you must seek medical attention immediately by calling 911 or calling your MD immediately  if symptoms less severe.  You must read complete instructions/literature along with all the possible adverse reactions/side effects for all the Medicines you take and that have been prescribed to you. Take any new Medicines after you have completely understood and accpet all the possible adverse reactions/side effects.   Do not drive when taking Pain medications or sleeping medications (Benzodaizepines)  Do not take more than prescribed Pain, Sleep and Anxiety Medications. It is not advisable to combine anxiety,sleep and pain medications without talking with your primary care practitioner  Special Instructions: If you have smoked or chewed Tobacco  in the last 2 yrs please stop smoking, stop any regular Alcohol  and or any Recreational drug use.  Wear Seat belts while driving.  Please note: You were cared for by a hospitalist during your hospital stay. Once you are discharged, your primary care physician will handle any further medical issues. Please note that NO REFILLS for any discharge medications will be authorized once you are discharged, as it is imperative that you return to your primary care physician (or establish a relationship with a primary care physician if you do not have one) for your post hospital discharge needs so that they can reassess your need for medications and monitor your lab values.  Total Time spent coordinating discharge  including counseling, education and face to face time equals less than 30 minutes.  SignedJeoffrey Massed 01/23/2023 8:44 AM

## 2023-01-23 NOTE — Evaluation (Signed)
Occupational Therapy Evaluation Patient Details Name: Grace Valdez MRN: [id number] DOB: [birth year] Today's Date: 01/23/2023   History of Present Illness Pt is a 57 yr old female who presented due to AMS. CT and MRI negative. 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 Pt reports they feel like they are back to their normal self and no changes in weakness or vision. Pt at this time was able to complete all dressing tasks in sitting to standing and oral care at sink level in standing independently. Pt was able to ambulate in room and hallways with no LOB or AE at this time. Occupational therapy signing off at this time. Thank you.      Recommendations for follow up therapy are one component of a multi-disciplinary discharge planning process, led by the attending physician.  Recommendations may be updated based on patient status, additional functional criteria and insurance authorization.   Assistance Recommended at Discharge None  Patient can return home with the following      Functional Status Assessment  Patient has had a recent decline in their functional status and demonstrates the ability to make significant improvements in function in a reasonable and predictable amount of time.  Equipment Recommendations  None recommended by OT    Recommendations for Other Services       Precautions / Restrictions Precautions Precautions: None Restrictions Weight Bearing Restrictions: No      Mobility Bed Mobility Overal bed mobility: Independent                  Transfers Overall transfer level: Independent                        Balance Overall balance assessment: Modified Independent (was able to complete LB dressing in standing position)                                         ADL either performed or assessed with clinical judgement   ADL Overall ADL's : Independent                                              Vision Baseline Vision/History: 1 Wears glasses Ability to See in Adequate Light: 0 Adequate Patient Visual Report: No change from baseline Vision Assessment?: No apparent visual deficits     Perception Perception Perception Tested?: No   Praxis Praxis Praxis tested?: Within functional limits    Pertinent Vitals/Pain Pain Assessment Pain Assessment: No/denies pain     Hand Dominance Right   Extremity/Trunk Assessment Upper Extremity Assessment Upper Extremity Assessment: Overall WFL for tasks assessed   Lower Extremity Assessment Lower Extremity Assessment: Defer to PT evaluation   Cervical / Trunk Assessment Cervical / Trunk Assessment: Normal   Communication Communication Communication: No difficulties   Cognition Arousal/Alertness: Awake/alert Behavior During Therapy: WFL for tasks assessed/performed Overall Cognitive Status: Within Functional Limits for tasks assessed                                       General Comments  Pt more educated on taking time into tasks  Exercises     Shoulder Instructions      Home Living Family/patient expects to be discharged to:: Private residence Living Arrangements: Alone     Home Access: Stairs to enter Entrance Stairs-Number of Steps: 3 Entrance Stairs-Rails: Can reach both Home Layout: One level     Bathroom Shower/Tub: IT trainer: Standard Bathroom Accessibility: Yes   Home Equipment: None          Prior Functioning/Environment Prior Level of Function : Independent/Modified Independent               ADLs Comments: works as a Production designer, theatre/television/film in Advice worker Problem List: Impaired balance (sitting and/or standing)      OT Treatment/Interventions:      OT Goals(Current goals can be found in the care plan section) Acute Rehab OT Goals Patient Stated Goal: To go home today OT Goal Formulation:  With patient Time For Goal Achievement: 01/23/23 Potential to Achieve Goals: Good  OT Frequency:      Co-evaluation              AM-PAC OT "6 Clicks" Daily Activity     Outcome Measure Help from another person eating meals?: None Help from another person taking care of personal grooming?: None Help from another person toileting, which includes using toliet, bedpan, or urinal?: None Help from another person bathing (including washing, rinsing, drying)?: None Help from another person to put on and taking off regular upper body clothing?: None Help from another person to put on and taking off regular lower body clothing?: None 6 Click Score: 24   End of Session Nurse Communication: Mobility status  Activity Tolerance: Patient tolerated treatment well Patient left: in chair;with call bell/phone within reach  OT Visit Diagnosis: Unsteadiness on feet (R26.81)                Time: 1610-9604 OT Time Calculation (min): 26 min Charges:  OT General Charges $OT Visit: 1 Visit OT Evaluation $OT Eval Low Complexity: 1 Low OT Treatments $Self Care/Home Management : 8-22 mins  Alphia Moh OTR/L  Acute Rehab Services  [phone] office number    Alphia Moh 01/23/2023, 8:18 AM

## 2023-01-23 NOTE — Progress Notes (Signed)
SLP Cancellation Note  Patient Details Name: Grace Valdez MRN: [id number] DOB: Apr 02, 1966   Cancelled treatment:       Reason Eval/Treat Not Completed: SLP screened, no needs identified, will sign off. Pt back to baseline. MRI unremarkable for acute changes.    Ardyth Gal MA, CCC-SLP Acute Rehabilitation Services   01/23/2023, 9:48 AM

## 2023-01-23 NOTE — Progress Notes (Signed)
AVS review due.Waiting for urine drug test collection per order.

## 2023-01-23 NOTE — Progress Notes (Signed)
PT Cancellation Note  Patient Details Name: Grace Valdez MRN: [id number] DOB: 01/04/1966   Cancelled Treatment:    Reason Eval/Treat Not Completed: PT screened, no needs identified, will sign off. Per conversation with OT, pt is at her baseline, independent with all mobility and no concerns upon discharge. PT will sign off at this time. Thank you.    Leonie Man 01/23/2023, 9:39 AM

## 2023-01-23 NOTE — Progress Notes (Signed)
Pt ordered to discharge home. AVS reviewed with pt. Education provided as needed. Patient verbalized understanding. All questions answered.   

## 2023-01-23 NOTE — Consult Note (Signed)
NEUROLOGY CONSULTATION NOTE   Date of service: Jan 23, 2023 Patient Name: Grace Valdez MRN:  [id number] DOB:  November 16, 1965 Reason for consult: "Episode of memory impairment" Requesting Provider: Lorin Glass, MD _ _ _   _ __   _ __ _ _  __ __   _ __   __ _  History of Present Illness  Grace Valdez is a 57 y.o. female with PMH significant for hypertension who spoke with her cousin in the morning and discussed details of her driving down to [place] to go see her daughter.  Patient got a call from her cousin again in the evening she was noted to be confused, had poor recollection of the conversation the morning and seemed off.  This persisted for about 2 to 3 hours and has not resolved and she is back to her baseline and somewhat improved recollection of details from yesterday.  She denies any prior history of similar episodes, no headache, no nausea, no vomiting, no fever.  She reports family history of stroke in her parent but is unable to recall more details.  She denies any prior history of strokes, denies any history of seizures, does not use any recreational substances.  She does not smoke.  She does not drink alcohol.  LKW: 1630 on 01/21/2023. mRS: 0 tNKASE: Not offered, due to resolution of symptoms. Thrombectomy: Not offered, due to resolution of symptoms. NIHSS components Score: Comment  1a Level of Conscious 0[x]  1[]  2[]  3[]      1b LOC Questions 0[x]  1[]  2[]       1c LOC Commands 0[x]  1[]  2[]       2 Best Gaze 0[x]  1[]  2[]       3 Visual 0[x]  1[]  2[]  3[]      4 Facial Palsy 0[x]  1[]  2[]  3[]      5a Motor Arm - left 0[x]  1[]  2[]  3[]  4[]  UN[]    5b Motor Arm - Right 0[x]  1[]  2[]  3[]  4[]  UN[]    6a Motor Leg - Left 0[x]  1[]  2[]  3[]  4[]  UN[]    6b Motor Leg - Right 0[x]  1[]  2[]  3[]  4[]  UN[]    7 Limb Ataxia 0[x]  1[]  2[]  3[]  UN[]     8 Sensory 0[x]  1[]  2[]  UN[]      9 Best Language 0[x]  1[]  2[]  3[]      10 Dysarthria 0[x]  1[]  2[]  UN[]      11 Extinct. and Inattention 0[x]  1[]  2[]       TOTAL:  0      ROS   Constitutional Denies weight loss, fever and chills.   HEENT Denies changes in vision and hearing.   Respiratory Denies SOB and cough.   CV Denies palpitations and CP   GI Denies abdominal pain, nausea, vomiting and diarrhea.   GU Denies dysuria and urinary frequency.   MSK Denies myalgia and joint pain.   Skin Denies rash and pruritus.   Neurological Denies headache and syncope.   Psychiatric Denies recent changes in mood. Denies anxiety and depression.    Past History   Past Medical History:  Diagnosis Date   Hypertension    Past Surgical History:  Procedure Laterality Date   CESAREAN SECTION  1992   COLONOSCOPY WITH PROPOFOL N/A 11/19/2020   Procedure: COLONOSCOPY WITH PROPOFOL;  Surgeon: Dolores Frame, MD;  Location: [facility];  Service: Gastroenterology;  Laterality: N/A;  AM   POLYPECTOMY  11/19/2020   Procedure: POLYPECTOMY;  Surgeon: Dolores Frame, MD;  Location: [facility];  Service: Gastroenterology;;  History reviewed. No pertinent family history. Social History   Socioeconomic History   Marital status: Legally Separated    Spouse name: Not on file   Number of children: Not on file   Years of education: Not on file   Highest education level: Not on file  Occupational History   Not on file  Tobacco Use   Smoking status: Some Days    Types: Cigarettes, Cigars   Smokeless tobacco: Never  Vaping Use   Vaping Use: Never used  Substance and Sexual Activity   Alcohol use: Yes    Comment: ocass   Drug use: No   Sexual activity: Yes  Other Topics Concern   Not on file  Social History Narrative   Not on file   Social Determinants of Health   Financial Resource Strain: Not on file  Food Insecurity: Not on file  Transportation Needs: Not on file  Physical Activity: Not on file  Stress: Not on file  Social Connections: Not on file   Allergies  Allergen Reactions   Phentermine Nausea And Vomiting     Medications   Medications Prior to Admission  Medication Sig Dispense Refill Last Dose   Calcium Carb-Cholecalciferol (CALTRATE 600+D3) 600-20 MG-MCG TABS Yake one tablet by mouth twice daily for bone and muscle health 180 tablet 11 Past Week   fluticasone (FLONASE SENSIMIST) 27.5 MCG/SPRAY nasal spray Place 2 sprays into the nose daily. 10 g 12 unknown   loratadine (EQ ALLERGY RELIEF) 10 MG tablet TAKE 1 TABLET BY MOUTH ONCE DAILY 30 tablet 5 01/21/2023   triamterene-hydrochlorothiazide (MAXZIDE-25) 37.5-25 MG tablet Take 1 tablet by mouth daily. 90 tablet 3 01/21/2023   benzonatate (TESSALON) 100 MG capsule Take 1 capsule (100 mg total) by mouth 2 (two) times daily as needed for cough. (Patient not taking: Reported on 01/22/2023) 20 capsule 0 Not Taking   promethazine-dextromethorphan (PROMETHAZINE-DM) 6.25-15 MG/5ML syrup Take one teaspoon by mouth at bedtime, as needed, for cough (Patient not taking: Reported on 01/22/2023) 180 mL 0 Not Taking     Vitals   Vitals:   01/22/23 1843 01/22/23 2023 01/22/23 2339 01/23/23 0406  BP: (!) 163/84 120/70 110/62 109/80  Pulse: 60 65 65 67  Resp: 18 14 16 15   Temp: 97.7 F (36.5 C) 98.2 F (36.8 C) 98.3 F (36.8 C) 97.6 F (36.4 C)  TempSrc: Oral Oral Oral Oral  SpO2: 98% 98% 97% 97%  Weight:         Body mass index is 32.45 kg/m.  Physical Exam   General: Laying comfortably in bed; in no acute distress.  HENT: Normal oropharynx and mucosa. Normal external appearance of ears and nose.  Neck: Supple, no pain or tenderness  CV: No JVD. No peripheral edema.  Pulmonary: Symmetric Chest rise. Normal respiratory effort.  Abdomen: Soft to touch, non-tender.  Ext: No cyanosis, edema, or deformity  Skin: No rash. Normal palpation of skin.   Musculoskeletal: Normal digits and nails by inspection. No clubbing.   Neurologic Examination  Mental status/Cognition: Alert, oriented to self, place, month and year, good attention.  Speech/language:  Fluent, comprehension intact, object naming intact, repetition intact.  Cranial nerves:   CN II Pupils equal and reactive to light, no VF deficits    CN III,IV,VI EOM intact, no gaze preference or deviation, no nystagmus    CN V normal sensation in V1, V2, and V3 segments bilaterally    CN VII no asymmetry, no nasolabial fold flattening    CN  VIII normal hearing to speech    CN IX & X normal palatal elevation, no uvular deviation    CN XI 5/5 head turn and 5/5 shoulder shrug bilaterally    CN XII midline tongue protrusion    Motor:  Muscle bulk: normal, tone normal, pronator drift none tremor none Mvmt Root Nerve  Muscle Right Left Comments  SA C5/6 Ax Deltoid 5 5   EF C5/6 Mc Biceps 5 5   EE C6/7/8 Rad Triceps 5 5   WF C6/7 Med FCR     WE C7/8 PIN ECU     F Ab C8/T1 U ADM/FDI 5 5   HF L1/2/3 Fem Illopsoas 5 5   KE L2/3/4 Fem Quad 5 5   DF L4/5 D Peron Tib Ant 5 5   PF S1/2 Tibial Grc/Sol 5 5    Sensation:  Light touch Intact throughout   Pin prick    Temperature    Vibration   Proprioception    Coordination/Complex Motor:  - Finger to Nose and intact bilaterally - Heel to shin into bilaterally - Rapid alternating movement are normal - Gait: deferred for patient safety.  Labs   CBC:  Recent Labs  Lab 01/21/23 2057 01/22/23 0510  WBC 9.8 7.8  HGB 12.4 12.2  HCT 38.0 36.4  MCV 94.1 92.6  PLT 354 337    Basic Metabolic Panel:  Lab Results  Component Value Date   NA 137 01/22/2023   K 3.0 (L) 01/22/2023   CO2 24 01/22/2023   GLUCOSE 117 (H) 01/22/2023   BUN 17 01/22/2023   CREATININE 1.01 (H) 01/22/2023   CALCIUM 9.1 01/22/2023   GFRNONAA >60 01/22/2023   GFRAA 67 11/05/2020   Lipid Panel:  Lab Results  Component Value Date   LDLCALC 148 (H) 01/22/2023   HgbA1c:  Lab Results  Component Value Date   HGBA1C 6.3 (H) 01/22/2023   Urine Drug Screen: No results found for: "LABOPIA", "COCAINSCRNUR", "LABBENZ", "AMPHETMU", "THCU", "LABBARB"  Alcohol  Level No results found for: "ETH"  CT Head without contrast(Personally reviewed): CTH was negative for a large hypodensity concerning for a large territory infarct or hyperdensity concerning for an ICH  CT angio Head and Neck with contrast(Personally reviewed): No LVO  MRI Brain(Personally reviewed): Negative for stroke  Impression   Ashantii Brawdy Klecka is a 57 y.o. 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  She is back to her baseline right now and has no focal deficit.  Stroke workup has been completed and notable for new onset diabetes, new onset hyperlipidemia.  Recommendations  - Frequent Neuro checks per stroke unit protocol - TTE with EF of 60-65%, no PFO - LDL elevated to 148, agree with atorvastatin 40mg  daily. - HbA1c elevated to 6.3 - Antithrombotic -aspirin 81 mg daily along with Plavix 75 mg daily for 21 days, followed by aspirin 81 mg daily alone. - Recommend DVT ppx - SBP goal - aim for gradual normotension. - Recommend Telemetry monitoring for arrythmia - Recommend bedside swallow screen prior to PO intake. - Stroke education booklet - Recommend PT/OT/SLP consult - follow up with stroke clinic outpatient. - we will signoff. Okay for discharge this AM. ______________________________________________________________________   Thank you for the opportunity to take part in the care of this patient. If you have any further questions, please contact the neurology consultation attending.  Signed,  Erick Blinks [facility] Pager Number [phone] _ _ _  _ __   _ __ _ _  __ __   _ __   __ _  

## 2023-01-25 ENCOUNTER — Telehealth: Payer: Self-pay

## 2023-01-25 NOTE — Transitions of Care (Post Inpatient/ED Visit) (Signed)
   01/25/2023  Name: Grace Valdez MRN: [id number] DOB: 07-Jan-1966  Today's TOC FU Call Status: Today's TOC FU Call Status:: Successful TOC FU Call Competed TOC FU Call Complete Date: 01/25/23  Transition Care Management Follow-up Telephone Call Date of Discharge: 01/23/23 Discharge Facility: [facility]) Type of Discharge: Inpatient Admission Primary Inpatient Discharge Diagnosis:: TIA How have you been since you were released from the hospital?: Better Any questions or concerns?: No  Items Reviewed: Did you receive and understand the discharge instructions provided?: Yes Medications obtained,verified, and reconciled?: Yes (Medications Reviewed) Any new allergies since your discharge?: No Dietary orders reviewed?: Yes Do you have support at home?: No  Medications Reviewed Today: Medications Reviewed Today     Reviewed by Karena Addison, LPN (Licensed Practical Nurse) on 01/25/23 at 1221  Med List Status: <None>   Medication Order Taking? Sig Documenting Provider Last Dose Status Informant  aspirin EC 81 MG tablet [id number] Yes Take 1 tablet (81 mg total) by mouth daily. Swallow whole. Maretta Bees, MD Taking Active   atorvastatin (LIPITOR) 40 MG tablet [id number] Yes Take 1 tablet (40 mg total) by mouth daily. Maretta Bees, MD Taking Active   benzonatate (TESSALON) 100 MG capsule [id number] No Take 1 capsule (100 mg total) by mouth 2 (two) times daily as needed for cough.  Patient not taking: Reported on 01/22/2023   Kerri Perches, MD Not Taking Active Self  Calcium Carb-Cholecalciferol (CALTRATE 600+D3) 600-20 MG-MCG TABS [id number] Yes Yake one tablet by mouth twice daily for bone and muscle health Kerri Perches, MD Taking Active Self  clopidogrel (PLAVIX) 75 MG tablet [id number] Yes Take 1 tablet (75 mg total) by mouth daily. Maretta Bees, MD Taking Active   fluticasone [facility] SENSIMIST) 27.5 MCG/SPRAY nasal spray [id number] Yes Place 2 sprays  into the nose daily. Kerri Perches, MD Taking Active Self  loratadine [facility] ALLERGY RELIEF) 10 MG tablet [id number] Yes TAKE 1 TABLET BY MOUTH ONCE DAILY Kerri Perches, MD Taking Active Self  triamterene-hydrochlorothiazide (MAXZIDE-25) 37.5-25 MG tablet [id number] Yes Take 1 tablet by mouth daily. Kerri Perches, MD Taking Active Self            Home Care and Equipment/Supplies: Were Home Health Services Ordered?: NA Any new equipment or medical supplies ordered?: NA  Functional Questionnaire: Do you need assistance with bathing/showering or dressing?: No Do you need assistance with meal preparation?: No Do you need assistance with eating?: No Do you have difficulty maintaining continence: No Do you need assistance with getting out of bed/getting out of a chair/moving?: No Do you have difficulty managing or taking your medications?: No  Follow up appointments reviewed: PCP Follow-up appointment confirmed?: Yes Date of PCP follow-up appointment?: 01/26/23 Follow-up Provider: [facility] Follow-up appointment confirmed?: NA Do you need transportation to your follow-up appointment?: No Do you understand care options if your condition(s) worsen?: Yes-patient verbalized understanding    SIGNATURE Karena Addison, LPN [facility] Nurse Health Advisor Direct Dial [phone]

## 2023-01-26 ENCOUNTER — Ambulatory Visit: Payer: 59 | Admitting: Family Medicine

## 2023-01-26 ENCOUNTER — Encounter: Payer: Self-pay | Admitting: Family Medicine

## 2023-01-26 VITALS — BP 126/83 | HR 90 | Ht 65.0 in | Wt 196.0 lb

## 2023-01-26 DIAGNOSIS — E785 Hyperlipidemia, unspecified: Secondary | ICD-10-CM

## 2023-01-26 DIAGNOSIS — I10 Essential (primary) hypertension: Secondary | ICD-10-CM

## 2023-01-26 DIAGNOSIS — R7302 Impaired glucose tolerance (oral): Secondary | ICD-10-CM | POA: Diagnosis not present

## 2023-01-26 DIAGNOSIS — E669 Obesity, unspecified: Secondary | ICD-10-CM

## 2023-01-26 DIAGNOSIS — Z09 Encounter for follow-up examination after completed treatment for conditions other than malignant neoplasm: Secondary | ICD-10-CM | POA: Insufficient documentation

## 2023-01-26 DIAGNOSIS — G459 Transient cerebral ischemic attack, unspecified: Secondary | ICD-10-CM | POA: Diagnosis not present

## 2023-01-26 MED ORDER — ATORVASTATIN CALCIUM 40 MG PO TABS: 40.0000 mg | ORAL_TABLET | Freq: Every day | ORAL | 5 refills | Status: DC

## 2023-01-26 NOTE — Progress Notes (Signed)
Grace Valdez     MRN: [id number]      DOB: 15-May-1966  Chief Complaint  Patient presents with   Transitions Of Care    D/c 01/23/23    HPI Grace Valdez is here for [facility] visit, she was hospitalized from 5/9 to 01/23/2023, dx TIA Patient in for follow up of recent hospitalization. Discharge summary, and laboratory and radiology data are reviewed, and any questions or concerns  are discussed. Specific issues requiring follow up are specifically addressed. Has specific concerns about lifelong statin use and clarifying plavix is for 3 weeks only and Asprin to continue indefinitely Has questions about injectable med for diabetes,is willing to go to educator as she prefers not to take med if not needed Able to recall essentially normal behavior around the vent but trusted family member reports confusion during  a  phone call the day she was admitted  ROS Denies recent fever or chills. Denies sinus pressure, nasal congestion, ear pain or sore throat. Denies chest congestion, productive cough or wheezing. Denies chest pains, palpitations and leg swelling Denies abdominal pain, nausea, vomiting,diarrhea or constipation.   Denies dysuria, frequency, hesitancy or incontinence. Denies joint pain, swelling and limitation in mobility. Denies headaches, seizures, numbness, or tingling. Denies depression, anxiety or insomnia. Denies skin break down or rash.   PE  BP 126/83   Pulse 90   Ht 5\' 5"  (1.651 m)   Wt 196 lb 0.6 oz (88.9 kg)   LMP 01/24/2013   SpO2 92%   BMI 32.62 kg/m   Patient alert and oriented and in no cardiopulmonary distress.  HEENT: No facial asymmetry, EOMI,     Neck supple .  Chest: Clear to auscultation bilaterally.  CVS: S1, S2 no murmurs, no S3.Regular rate.  .   Ext: No edema  MS: Adequate ROM spine, shoulders, hips and knees.  Skin: Intact, no ulcerations or rash noted.  Psych: Good eye contact, normal affect. Memory intact not anxious or depressed  appearing.  CNS: CN 2-12 intact, power,  normal throughout.no focal deficits noted.   Assessment & Plan  TIA (transient ischemic attack) No neuro deficits at visit today, entire workup reviewed during visit Pt has clarification on medications Neuro appt set up during the visit  Hyperlipidemia LDL goal <70 Hyperlipidemia:Low fat diet discussed and encouraged.   Lipid Panel  Lab Results  Component Value Date   CHOL 221 (H) 01/22/2023   HDL 60 01/22/2023   LDLCALC 148 (H) 01/22/2023   TRIG 66 01/22/2023   CHOLHDL 3.7 01/22/2023     Commit to atorvastatin as prescribed Rept lipid profile in 14 weeks at follow up  Essential hypertension Controlled, no change in medication DASH diet and commitment to daily physical activity for a minimum of 30 minutes discussed and encouraged, as a part of hypertension management. The importance of attaining a healthy weight is also discussed.     01/26/2023    4:06 PM 01/23/2023    8:26 AM 01/23/2023    4:06 AM 01/22/2023   11:39 PM 01/22/2023    8:23 PM 01/22/2023    6:43 PM 01/22/2023    1:32 PM  BP/Weight  Systolic BP 126 111 109 110 120 163 103  Diastolic BP 83 67 80 62 70 84 56  Wt. (Lbs) 196.04        BMI 32.62 kg/m2             IGT (impaired glucose tolerance) Patient educated about the importance of limiting  Carbohydrate intake , the need to commit to daily physical activity for a minimum of 30 minutes , and to commit weight loss. The fact that changes in all these areas will reduce or eliminate all together the development of diabetes is stressed.      Latest Ref Rng & Units 01/22/2023    5:10 AM 01/21/2023    8:57 PM 10/02/2022   10:58 PM 09/10/2022    9:33 AM 07/01/2022    9:05 AM  Diabetic Labs  HbA1c 4.8 - 5.6 % 6.3    6.5  6.3   Chol 0 - 200 mg/dL 657    846  962   HDL >95 mg/dL 60    47  65   Calc LDL 0 - 99 mg/dL 284    97  132   Triglycerides <150 mg/dL 66    440  95   Creatinine 0.44 - 1.00 mg/dL 1.02  7.25   3.66  4.40  1.07       01/26/2023    4:06 PM 01/23/2023    8:26 AM 01/23/2023    4:06 AM 01/22/2023   11:39 PM 01/22/2023    8:23 PM 01/22/2023    6:43 PM 01/22/2023    1:32 PM  BP/Weight  Systolic BP 126 111 109 110 120 163 103  Diastolic BP 83 67 80 62 70 84 56  Wt. (Lbs) 196.04        BMI 32.62 kg/m2             No data to display         Refer to diabetic ed   Obesity (BMI 30.0-34.9)  Patient re-educated about  the importance of commitment to a  minimum of 150 minutes of exercise per week as able.  The importance of healthy food choices with portion control discussed, as well as eating regularly and within a 12 hour window most days. The need to choose "clean , green" food 50 to 75% of the time is discussed, as well as to make water the primary drink and set a goal of 64 ounces water daily.       01/26/2023    4:06 PM 01/21/2023    8:33 PM 10/02/2022    8:49 PM  Weight /BMI  Weight 196 lb 0.6 oz 195 lb 194 lb  Height 5\' 5"  (1.651 m)  5\' 5"  (1.651 m)  BMI 32.62 kg/m2 32.45 kg/m2 32.28 kg/m2    Refer nutrition ed  Hospital discharge follow-up Patient in for follow up of recent hospitalization. Discharge summary, and laboratory and radiology data are reviewed, and any questions or concerns  are discussed. Specific issues requiring follow up are specifically addressed.

## 2023-01-26 NOTE — Assessment & Plan Note (Addendum)
Patient educated about the importance of limiting  Carbohydrate intake , the need to commit to daily physical activity for a minimum of 30 minutes , and to commit weight loss. The fact that changes in all these areas will reduce or eliminate all together the development of diabetes is stressed.      Latest Ref Rng & Units 01/22/2023    5:10 AM 01/21/2023    8:57 PM 10/02/2022   10:58 PM 09/10/2022    9:33 AM 07/01/2022    9:05 AM  Diabetic Labs  HbA1c 4.8 - 5.6 % 6.3    6.5  6.3   Chol 0 - 200 mg/dL 409    811  914   HDL >78 mg/dL 60    47  65   Calc LDL 0 - 99 mg/dL 295    97  621   Triglycerides <150 mg/dL 66    308  95   Creatinine 0.44 - 1.00 mg/dL 6.57  8.46  9.62  9.52  1.07       01/26/2023    4:06 PM 01/23/2023    8:26 AM 01/23/2023    4:06 AM 01/22/2023   11:39 PM 01/22/2023    8:23 PM 01/22/2023    6:43 PM 01/22/2023    1:32 PM  BP/Weight  Systolic BP 126 111 109 110 120 163 103  Diastolic BP 83 67 80 62 70 84 56  Wt. (Lbs) 196.04        BMI 32.62 kg/m2             No data to display         Refer to diabetic ed

## 2023-01-26 NOTE — Assessment & Plan Note (Signed)
No neuro deficits at visit today, entire workup reviewed during visit Pt has clarification on medications Neuro appt set up during the visit

## 2023-01-26 NOTE — Assessment & Plan Note (Signed)
Patient in for follow up of recent hospitalization. Discharge summary, and laboratory and radiology data are reviewed, and any questions or concerns  are discussed. Specific issues requiring follow up are specifically addressed.  

## 2023-01-26 NOTE — Patient Instructions (Addendum)
F/u in 13 weeks, call if you need me sooner  You are  referred to diabetic educator  You take plavix for 3 week till done , then stop  Continue aspirin daily  Continue atorvastatin daily  Fasting lipid, cmp andEGFR, hBA1C 3 to 5 days before next scheduled visit, end August or early September  It is important that you exercise regularly at least 30 minutes 5 times a week. If you develop chest pain, have severe difficulty breathing, or feel very tired, stop exercising immediately and seek medical attention   Thanks for choosing [facility], we consider it a privelige to serve you.

## 2023-01-26 NOTE — Assessment & Plan Note (Addendum)
Hyperlipidemia:Low fat diet discussed and encouraged.   Lipid Panel  Lab Results  Component Value Date   CHOL 221 (H) 01/22/2023   HDL 60 01/22/2023   LDLCALC 148 (H) 01/22/2023   TRIG 66 01/22/2023   CHOLHDL 3.7 01/22/2023     Commit to atorvastatin as prescribed Rept lipid profile in 14 weeks at follow up

## 2023-01-26 NOTE — Assessment & Plan Note (Signed)
  Patient re-educated about  the importance of commitment to a  minimum of 150 minutes of exercise per week as able.  The importance of healthy food choices with portion control discussed, as well as eating regularly and within a 12 hour window most days. The need to choose "clean , green" food 50 to 75% of the time is discussed, as well as to make water the primary drink and set a goal of 64 ounces water daily.       01/26/2023    4:06 PM 01/21/2023    8:33 PM 10/02/2022    8:49 PM  Weight /BMI  Weight 196 lb 0.6 oz 195 lb 194 lb  Height 5\' 5"  (1.651 m)  5\' 5"  (1.651 m)  BMI 32.62 kg/m2 32.45 kg/m2 32.28 kg/m2    Refer nutrition ed

## 2023-01-26 NOTE — Assessment & Plan Note (Signed)
Controlled, no change in medication DASH diet and commitment to daily physical activity for a minimum of 30 minutes discussed and encouraged, as a part of hypertension management. The importance of attaining a healthy weight is also discussed.     01/26/2023    4:06 PM 01/23/2023    8:26 AM 01/23/2023    4:06 AM 01/22/2023   11:39 PM 01/22/2023    8:23 PM 01/22/2023    6:43 PM 01/22/2023    1:32 PM  BP/Weight  Systolic BP 126 111 109 110 120 163 103  Diastolic BP 83 67 80 62 70 84 56  Wt. (Lbs) 196.04        BMI 32.62 kg/m2

## 2023-01-28 NOTE — Addendum Note (Signed)
Addended by: Kerri Perches on: 01/28/2023 05:27 PM   Modules accepted: Level of Service

## 2023-03-12 ENCOUNTER — Ambulatory Visit: Payer: 59 | Admitting: Family Medicine

## 2023-04-02 ENCOUNTER — Ambulatory Visit (INDEPENDENT_AMBULATORY_CARE_PROVIDER_SITE_OTHER): Payer: 59 | Admitting: Neurology

## 2023-04-02 ENCOUNTER — Encounter: Payer: Self-pay | Admitting: Neurology

## 2023-04-02 VITALS — BP 128/84 | HR 75 | Ht 65.0 in | Wt 193.0 lb

## 2023-04-02 DIAGNOSIS — G459 Transient cerebral ischemic attack, unspecified: Secondary | ICD-10-CM

## 2023-04-02 NOTE — Patient Instructions (Addendum)
Continue current medications  Repeat lipid panel at your doctors office, if LDL still elevated, consider Crestor 10, or 5 mg. She was unable to tolerate Lipitor  Return as needed

## 2023-04-02 NOTE — Progress Notes (Signed)
[facility]  PATIENT: Grace Valdez DOB: [birth year]  REQUESTING CLINICIAN: Ghimire, Werner Lean, MD HISTORY FROM: Patient  REASON FOR VISIT: TIA    HISTORICAL  CHIEF COMPLAINT:  Chief Complaint  Patient presents with   New Patient (Initial Visit)    Rm 13, alone NP, TIA    HISTORY OF PRESENT ILLNESS:  This is a 57 year old woman with past medical history of hypertension, hyperlipidemia, who is presenting after an episode of confusion lasting less than an hour. Patient was on the phone with her cousin discussing her upcoming trip with to [facility] to visit her daughter. Her cousin noted that she was confused, didn't know why she was going to [place], who she was going to visit.  She got concerned and called her sister to check on her.  Sister found patient at work and one of her coworkers also mentioned that she was confused, acting weird.  At that time she was taken to the hospital.  By the time she reached the hospital she felt like she was back to her normal self.  Her initial NIH score was 0, her stroke workup including CT scan, MRI MRA, echocardiogram are normal except for an elevated LDL.  Patient diagnosed with TIA and discharged on DAPT for 21 days and Lipitor.  She reported Lipitor made her feel weird, she self discontinued.  She continued to DAPT and currently on aspirin alone.  She never had something like this in the past and since leaving the hospital, she has not had any additional event.  No other concerns.  She is going to see her primary care doctor soon and had a plan to repeat the LDL.  If elevated, she is agreeable to restarting another statin.  No other complaint, no other concerns.   OTHER MEDICAL CONDITIONS: Hypertension, Hyperlipidemia    REVIEW OF SYSTEMS: Full 14 system review of systems performed and negative with exception of: As noted in the HPI   ALLERGIES: Allergies  Allergen Reactions   Phentermine Nausea And Vomiting    HOME  MEDICATIONS: Outpatient Medications Prior to Visit  Medication Sig Dispense Refill   aspirin EC 81 MG tablet Take 1 tablet (81 mg total) by mouth daily. Swallow whole. 30 tablet 12   Calcium Carb-Cholecalciferol (CALTRATE 600+D3) 600-20 MG-MCG TABS Yake one tablet by mouth twice daily for bone and muscle health 180 tablet 11   fluticasone (FLONASE SENSIMIST) 27.5 MCG/SPRAY nasal spray Place 2 sprays into the nose daily. 10 g 12   loratadine (EQ ALLERGY RELIEF) 10 MG tablet TAKE 1 TABLET BY MOUTH ONCE DAILY 30 tablet 5   triamterene-hydrochlorothiazide (MAXZIDE-25) 37.5-25 MG tablet Take 1 tablet by mouth daily. 90 tablet 3   atorvastatin (LIPITOR) 40 MG tablet Take 1 tablet (40 mg total) by mouth daily. (Patient not taking: Reported on 04/02/2023) 30 tablet 5   clopidogrel (PLAVIX) 75 MG tablet Take 1 tablet (75 mg total) by mouth daily. (Patient not taking: Reported on 04/02/2023) 21 tablet 0   No facility-administered medications prior to visit.    PAST MEDICAL HISTORY: Past Medical History:  Diagnosis Date   Hypertension     PAST SURGICAL HISTORY: Past Surgical History:  Procedure Laterality Date   CESAREAN SECTION  1992   COLONOSCOPY WITH PROPOFOL N/A 11/19/2020   Procedure: COLONOSCOPY WITH PROPOFOL;  Surgeon: Dolores Frame, MD;  Location: [facility];  Service: Gastroenterology;  Laterality: N/A;  AM   POLYPECTOMY  11/19/2020   Procedure: POLYPECTOMY;  Surgeon: Marguerita Merles, Reuel Boom, MD;  Location: [facility];  Service: Gastroenterology;;    FAMILY HISTORY: History reviewed. No pertinent family history.  SOCIAL HISTORY: Social History   Socioeconomic History   Marital status: Legally Separated    Spouse name: Not on file   Number of children: Not on file   Years of education: Not on file   Highest education level: Not on file  Occupational History   Not on file  Tobacco Use   Smoking status: Some Days    Types: Cigarettes, Cigars   Smokeless  tobacco: Never  Vaping Use   Vaping status: Never Used  Substance and Sexual Activity   Alcohol use: Yes    Comment: ocass   Drug use: No   Sexual activity: Yes  Other Topics Concern   Not on file  Social History Narrative   Right handed   Lives alone   Caffeine- 1-2 cups daily   Social Determinants of Health   Financial Resource Strain: Not on file  Food Insecurity: Not on file  Transportation Needs: Not on file  Physical Activity: Not on file  Stress: Not on file  Social Connections: Not on file  Intimate Partner Violence: Not on file    PHYSICAL EXAM  GENERAL EXAM/CONSTITUTIONAL: Vitals:  Vitals:   04/02/23 0854  BP: 128/84  Pulse: 75  SpO2: 99%  Weight: 193 lb (87.5 kg)  Height: 5\' 5"  (1.651 m)   Body mass index is 32.12 kg/m. Wt Readings from Last 3 Encounters:  04/02/23 193 lb (87.5 kg)  01/26/23 196 lb 0.6 oz (88.9 kg)  01/21/23 195 lb (88.5 kg)   Patient is in no distress; well developed, nourished and groomed; neck is supple  MUSCULOSKELETAL: Gait, strength, tone, movements noted in Neurologic exam below  NEUROLOGIC: MENTAL STATUS:      No data to display         awake, alert, oriented to person, place and time recent and remote memory intact normal attention and concentration language fluent, comprehension intact, naming intact fund of knowledge appropriate  CRANIAL NERVE:  2nd, 3rd, 4th, 6th - Visual fields full to confrontation, extraocular muscles intact, no nystagmus 5th - facial sensation symmetric 7th - facial strength symmetric 8th - hearing intact 9th - palate elevates symmetrically, uvula midline 11th - shoulder shrug symmetric 12th - tongue protrusion midline  MOTOR:  normal bulk and tone, full strength in the BUE, BLE  SENSORY:  normal and symmetric to light touch  COORDINATION:  finger-nose-finger, fine finger movements normal  REFLEXES:  deep tendon reflexes present and symmetric  GAIT/STATION:   normal    DIAGNOSTIC DATA (LABS, IMAGING, TESTING) - I reviewed patient records, labs, notes, testing and imaging myself where available.  Lab Results  Component Value Date   WBC 7.8 01/22/2023   HGB 12.2 01/22/2023   HCT 36.4 01/22/2023   MCV 92.6 01/22/2023   PLT 337 01/22/2023      Component Value Date/Time   NA 137 01/22/2023 0510   NA 136 09/10/2022 0933   K 3.0 (L) 01/22/2023 0510   CL 104 01/22/2023 0510   CO2 24 01/22/2023 0510   GLUCOSE 117 (H) 01/22/2023 0510   BUN 17 01/22/2023 0510   BUN 20 09/10/2022 0933   CREATININE 1.01 (H) 01/22/2023 0510   CREATININE 0.95 11/06/2019 0933   CALCIUM 9.1 01/22/2023 0510   PROT 7.4 01/22/2023 0510   PROT 7.1 09/10/2022 0933   ALBUMIN 3.9 01/22/2023 0510   ALBUMIN  4.2 09/10/2022 0933   AST 18 01/22/2023 0510   ALT 18 01/22/2023 0510   ALKPHOS 51 01/22/2023 0510   BILITOT 0.9 01/22/2023 0510   BILITOT 0.2 09/10/2022 0933   GFRNONAA >60 01/22/2023 0510   GFRNONAA 67 11/06/2019 0933   GFRAA 67 11/05/2020 1143   GFRAA 78 11/06/2019 0933   Lab Results  Component Value Date   CHOL 221 (H) 01/22/2023   HDL 60 01/22/2023   LDLCALC 148 (H) 01/22/2023   TRIG 66 01/22/2023   CHOLHDL 3.7 01/22/2023   Lab Results  Component Value Date   HGBA1C 6.3 (H) 01/22/2023   No results found for: "VITAMINB12" Lab Results  Component Value Date   TSH 3.650 09/10/2022    MRI Brain 01/22/2023 1. No acute intracranial abnormality. 2. Minimal multifocal hyperintense T2-weight signal within the white matter, nonspecific but may be seen in the setting of migraine headaches or early chronic small vessel ischemia.   CTA Head and Neck 01/21/2023 Normal CTA of the head and neck    Echo 01/22/2023 1. Left ventricular ejection fraction, by estimation, is 55 to 60%. The  left ventricle has normal function. The left ventricle has no regional wall motion abnormalities. Left ventricular diastolic parameters were normal.  2. Right ventricular  systolic function is normal. The right ventricular size is normal. Tricuspid regurgitation signal is inadequate for assessing PA pressure.  3. The mitral valve is grossly normal. Trivial mitral valve regurgitation.  4. The aortic valve is tricuspid. Aortic valve regurgitation is not visualized.  5. The inferior vena cava is normal in size with greater than 50%  respiratory variability, suggesting right atrial pressure of 3 mmHg.    ASSESSMENT AND PLAN  57 y.o. year old female with history of hypertension, hyperlipidemia who is present after an episode of confusion.  Her workup was negative for acute stroke.  Patient likely had a TIA.  She completed DAPT for 21 days and currently on aspirin.  Her LDL was 148, she could not tolerate Lipitor and self discontinued.  Plan for now is to continue aspirin and recheck a lipid profile at her next follow-up with PCP.  If all the LDL still elevated, she can consider Crestor 10 or 5 mg.  Continue to follow with PCP return as needed.   1. TIA (transient ischemic attack)      Patient Instructions  Continue current medications  Repeat lipid panel at your doctors office, if LDL still elevated, consider Crestor 10, or 5 mg. She was unable to tolerate Lipitor  Return as needed   No orders of the defined types were placed in this encounter.   No orders of the defined types were placed in this encounter.   Return if symptoms worsen or fail to improve.    Windell Norfolk, MD 04/02/2023, 2:14 PM  [facility] [address] [place], [place] [address] [phone]

## 2023-04-27 ENCOUNTER — Ambulatory Visit: Payer: 59 | Admitting: Family Medicine

## 2023-04-27 ENCOUNTER — Encounter: Payer: Self-pay | Admitting: Family Medicine

## 2023-04-27 VITALS — BP 127/75 | HR 71 | Ht 65.0 in | Wt 194.0 lb

## 2023-04-27 DIAGNOSIS — E669 Obesity, unspecified: Secondary | ICD-10-CM

## 2023-04-27 DIAGNOSIS — G459 Transient cerebral ischemic attack, unspecified: Secondary | ICD-10-CM

## 2023-04-27 DIAGNOSIS — E785 Hyperlipidemia, unspecified: Secondary | ICD-10-CM

## 2023-04-27 DIAGNOSIS — I10 Essential (primary) hypertension: Secondary | ICD-10-CM | POA: Diagnosis not present

## 2023-04-27 DIAGNOSIS — R7302 Impaired glucose tolerance (oral): Secondary | ICD-10-CM

## 2023-04-27 DIAGNOSIS — E559 Vitamin D deficiency, unspecified: Secondary | ICD-10-CM

## 2023-04-27 MED ORDER — ROSUVASTATIN CALCIUM 5 MG PO TABS
30.0000 mg | ORAL_TABLET | Freq: Every day | ORAL | 5 refills | Status: DC
Start: 2023-04-27 — End: 2023-04-28

## 2023-04-27 NOTE — Patient Instructions (Addendum)
F/U in 4 months, call if you need me sooner  New for cholesterol is rosuvastatin 5 mg 1 daily.  I recommend as we discussed taking it 1 tablet 4 days a week for the first week and once you can tolerate this try and increase to the 7 days/week.  If you have problems please  send a message because you need to be able to function and not have the medication adversely affect you  Continue to work on eating primarily vegetables and fruit fresh or frozen and intentionally reducing fatty foods fried foods.  Blood pressure is excellent.  Fasting lipid CMP and EGFR and HbA1c , TSH and Vit D to be obtained 3 to 5 days before your next visit in the next 4 months.  Thanks for choosing [facility], we consider it a privelige to serve you.

## 2023-04-28 MED ORDER — ROSUVASTATIN CALCIUM 5 MG PO TABS: 5.0000 mg | ORAL_TABLET | Freq: Every day | ORAL | 5 refills | Status: DC

## 2023-05-01 ENCOUNTER — Encounter: Payer: Self-pay | Admitting: Family Medicine

## 2023-05-01 NOTE — Assessment & Plan Note (Signed)
  Patient re-educated about  the importance of commitment to a  minimum of 150 minutes of exercise per week as able.  The importance of healthy food choices with portion control discussed, as well as eating regularly and within a 12 hour window most days. The need to choose "clean , green" food 50 to 75% of the time is discussed, as well as to make water the primary drink and set a goal of 64 ounces water daily.       04/27/2023    3:57 PM 04/02/2023    8:54 AM 01/26/2023    4:06 PM  Weight /BMI  Weight 194 lb 193 lb 196 lb 0.6 oz  Height 5\' 5"  (1.651 m) 5\' 5"  (1.651 m) 5\' 5"  (1.651 m)  BMI 32.28 kg/m2 32.12 kg/m2 32.62 kg/m2    Unchanged

## 2023-05-01 NOTE — Assessment & Plan Note (Signed)
Hyperlipidemia:Low fat diet discussed and encouraged.   Lipid Panel  Lab Results  Component Value Date   CHOL 202 (H) 04/23/2023   HDL 53 04/23/2023   LDLCALC 121 (H) 04/23/2023   TRIG 160 (H) 04/23/2023   CHOLHDL 3.8 04/23/2023     Will start crestor low dose titrating  from 4/week to daily if tolerated Updated lab needed at/ before next visit.

## 2023-05-01 NOTE — Progress Notes (Signed)
CANADA ORCUTT     MRN: [id number]      DOB: [birth year]  Chief Complaint  Patient presents with   Follow-up    Follow up    HPI [name] is here for follow up and re-evaluation of chronic medical conditions, medication management and review of any available recent lab and radiology data.  Preventive health is updated, specifically  Cancer screening and Immunization.   Questions or concerns regarding consultations or procedures which the PT has had in the interim are  addressed.Recent evaluation by Neurology, no changein management plan, optimization of medical management The PT reports intolerance to atorvastatin at prescribed dose , was unable to function and has not been taking the medication, willing to try alternate at lower doseThere are no new concerns.  There are no specific complaints   ROS Denies recent fever or chills. Denies sinus pressure, nasal congestion, ear pain or sore throat. Denies chest congestion, productive cough or wheezing. Denies chest pains, palpitations and leg swelling Denies abdominal pain, nausea, vomiting,diarrhea or constipation.   Denies dysuria, frequency, hesitancy or incontinence. Denies joint pain, swelling and limitation in mobility. Denies headaches, seizures, numbness, or tingling. Denies depression, anxiety or insomnia. Denies skin break down or rash.   PE  BP 127/75 (BP Location: Right Arm, Patient Position: Sitting, Cuff Size: Large)   Pulse 71   Ht 5\' 5"  (1.651 m)   Wt 194 lb (88 kg)   LMP 01/24/2013   SpO2 96%   BMI 32.28 kg/m   Patient alert and oriented and in no cardiopulmonary distress.  HEENT: No facial asymmetry, EOMI,     Neck supple .  Chest: Clear to auscultation bilaterally.  CVS: S1, S2 no murmurs, no S3.Regular rate.  ABD: Soft non tender.   Ext: No edema  MS: Adequate ROM spine, shoulders, hips and knees.  Skin: Intact, no ulcerations or rash noted.  Psych: Good eye contact, normal affect. Memory intact  not anxious or depressed appearing.  CNS: CN 2-12 intact, power,  normal throughout.no focal deficits noted.   Assessment & Plan  Essential hypertension Controlled, no change in medication DASH diet and commitment to daily physical activity for a minimum of 30 minutes discussed and encouraged, as a part of hypertension management. The importance of attaining a healthy weight is also discussed.     04/27/2023    3:57 PM 04/02/2023    8:54 AM 01/26/2023    4:06 PM 01/23/2023    8:26 AM 01/23/2023    4:06 AM 01/22/2023   11:39 PM 01/22/2023    8:23 PM  BP/Weight  Systolic BP 127 128 126 111 109 110 120  Diastolic BP 75 84 83 67 80 62 70  Wt. (Lbs) 194 193 196.04      BMI 32.28 kg/m2 32.12 kg/m2 32.62 kg/m2           Obesity (BMI 30.0-34.9)  Patient re-educated about  the importance of commitment to a  minimum of 150 minutes of exercise per week as able.  The importance of healthy food choices with portion control discussed, as well as eating regularly and within a 12 hour window most days. The need to choose "clean , green" food 50 to 75% of the time is discussed, as well as to make water the primary drink and set a goal of 64 ounces water daily.       04/27/2023    3:57 PM 04/02/2023    8:54 AM 01/26/2023    4:06  PM  Weight /BMI  Weight 194 lb 193 lb 196 lb 0.6 oz  Height 5\' 5"  (1.651 m) 5\' 5"  (1.651 m) 5\' 5"  (1.651 m)  BMI 32.28 kg/m2 32.12 kg/m2 32.62 kg/m2    Unchanged  IGT (impaired glucose tolerance) Patient educated about the importance of limiting  Carbohydrate intake , the need to commit to daily physical activity for a minimum of 30 minutes , and to commit weight loss. The fact that changes in all these areas will reduce or eliminate all together the development of diabetes is stressed.  Unchanged     Latest Ref Rng & Units 04/23/2023    9:34 AM 01/22/2023    5:10 AM 01/21/2023    8:57 PM 10/02/2022   10:58 PM 09/10/2022    9:33 AM  Diabetic Labs  HbA1c 4.8 -  5.6 % 6.3  6.3    6.5   Chol 100 - 199 mg/dL 440  102    725   HDL >36 mg/dL 53  60    47   Calc LDL 0 - 99 mg/dL 644  034    97   Triglycerides 0 - 149 mg/dL 742  66    595   Creatinine 0.57 - 1.00 mg/dL 6.38  7.56  4.33  2.95  1.20       04/27/2023    3:57 PM 04/02/2023    8:54 AM 01/26/2023    4:06 PM 01/23/2023    8:26 AM 01/23/2023    4:06 AM 01/22/2023   11:39 PM 01/22/2023    8:23 PM  BP/Weight  Systolic BP 127 128 126 111 109 110 120  Diastolic BP 75 84 83 67 80 62 70  Wt. (Lbs) 194 193 196.04      BMI 32.28 kg/m2 32.12 kg/m2 32.62 kg/m2           No data to display            Hyperlipidemia LDL goal <70 Hyperlipidemia:Low fat diet discussed and encouraged.   Lipid Panel  Lab Results  Component Value Date   CHOL 202 (H) 04/23/2023   HDL 53 04/23/2023   LDLCALC 121 (H) 04/23/2023   TRIG 160 (H) 04/23/2023   CHOLHDL 3.8 04/23/2023     Will start crestor low dose titrating  from 4/week to daily if tolerated Updated lab needed at/ before next visit.   TIA (transient ischemic attack) No recurrent symptos has had Neurology eval as out pt  Vitamin D deficiency Updated lab needed at/ before next visit.

## 2023-05-01 NOTE — Assessment & Plan Note (Signed)
Patient educated about the importance of limiting  Carbohydrate intake , the need to commit to daily physical activity for a minimum of 30 minutes , and to commit weight loss. The fact that changes in all these areas will reduce or eliminate all together the development of diabetes is stressed.  Unchanged     Latest Ref Rng & Units 04/23/2023    9:34 AM 01/22/2023    5:10 AM 01/21/2023    8:57 PM 10/02/2022   10:58 PM 09/10/2022    9:33 AM  Diabetic Labs  HbA1c 4.8 - 5.6 % 6.3  6.3    6.5   Chol 100 - 199 mg/dL 161  096    045   HDL >40 mg/dL 53  60    47   Calc LDL 0 - 99 mg/dL 981  191    97   Triglycerides 0 - 149 mg/dL 478  66    295   Creatinine 0.57 - 1.00 mg/dL 6.21  3.08  6.57  8.46  1.20       04/27/2023    3:57 PM 04/02/2023    8:54 AM 01/26/2023    4:06 PM 01/23/2023    8:26 AM 01/23/2023    4:06 AM 01/22/2023   11:39 PM 01/22/2023    8:23 PM  BP/Weight  Systolic BP 127 128 126 111 109 110 120  Diastolic BP 75 84 83 67 80 62 70  Wt. (Lbs) 194 193 196.04      BMI 32.28 kg/m2 32.12 kg/m2 32.62 kg/m2           No data to display

## 2023-05-01 NOTE — Assessment & Plan Note (Signed)
No recurrent symptos has had Neurology eval as out pt

## 2023-05-01 NOTE — Assessment & Plan Note (Signed)
Controlled, no change in medication DASH diet and commitment to daily physical activity for a minimum of 30 minutes discussed and encouraged, as a part of hypertension management. The importance of attaining a healthy weight is also discussed.     04/27/2023    3:57 PM 04/02/2023    8:54 AM 01/26/2023    4:06 PM 01/23/2023    8:26 AM 01/23/2023    4:06 AM 01/22/2023   11:39 PM 01/22/2023    8:23 PM  BP/Weight  Systolic BP 127 128 126 111 109 110 120  Diastolic BP 75 84 83 67 80 62 70  Wt. (Lbs) 194 193 196.04      BMI 32.28 kg/m2 32.12 kg/m2 32.62 kg/m2

## 2023-05-01 NOTE — Assessment & Plan Note (Signed)
Updated lab needed at/ before next visit.   

## 2023-05-04 ENCOUNTER — Telehealth: Payer: Self-pay | Admitting: Family Medicine

## 2023-05-04 NOTE — Telephone Encounter (Signed)
CMA Student Zenon Mayo ,  Made outgoing call to [employer], spoke to [facility]. Confirmed for provider that Prescription of Crestor 5 mg has been  received by pharmacy and has been filled for patient at 1- 5mg  tab to be taken once daily by mouth. Pharmacy confirmed order has been filled but not yet received by patient.

## 2023-06-06 ENCOUNTER — Other Ambulatory Visit: Payer: Self-pay | Admitting: Family Medicine

## 2023-07-19 ENCOUNTER — Other Ambulatory Visit: Payer: Self-pay | Admitting: Family Medicine

## 2023-08-14 ENCOUNTER — Other Ambulatory Visit: Payer: Self-pay | Admitting: Family Medicine

## 2023-08-20 LAB — LIPID PANEL
Chol/HDL Ratio: 3.3 {ratio} (ref 0.0–4.4)
Cholesterol, Total: 200 mg/dL — ABNORMAL HIGH (ref 100–199)
HDL: 61 mg/dL (ref >39)
LDL Chol Calc (NIH): 127 mg/dL — ABNORMAL HIGH (ref 0–99)
Triglycerides: 67 mg/dL (ref 0–149)
VLDL Cholesterol Cal: 12 mg/dL (ref 5–40)

## 2023-08-20 LAB — VITAMIN D 25 HYDROXY (VIT D DEFICIENCY, FRACTURES): Vit D, 25-Hydroxy: 41.6 ng/mL (ref 30.0–100.0)

## 2023-08-20 LAB — CMP14+EGFR
ALT: 12 [IU]/L (ref 0–32)
AST: 18 [IU]/L (ref 0–40)
Albumin: 4.1 g/dL (ref 3.8–4.9)
Alkaline Phosphatase: 65 [IU]/L (ref 44–121)
BUN/Creatinine Ratio: 26 — ABNORMAL HIGH (ref 9–23)
BUN: 24 mg/dL (ref 6–24)
Bilirubin Total: 0.4 mg/dL (ref 0.0–1.2)
CO2: 21 mmol/L (ref 20–29)
Calcium: 9.6 mg/dL (ref 8.7–10.2)
Chloride: 105 mmol/L (ref 96–106)
Creatinine, Ser: 0.92 mg/dL (ref 0.57–1.00)
Globulin, Total: 2.6 g/dL (ref 1.5–4.5)
Glucose: 92 mg/dL (ref 70–99)
Potassium: 4.4 mmol/L (ref 3.5–5.2)
Sodium: 142 mmol/L (ref 134–144)
Total Protein: 6.7 g/dL (ref 6.0–8.5)
eGFR: 72 mL/min/{1.73_m2} (ref >59)

## 2023-08-20 LAB — HEMOGLOBIN A1C
Est. average glucose Bld gHb Est-mCnc: 128 mg/dL
Hgb A1c MFr Bld: 6.1 % — ABNORMAL HIGH (ref 4.8–5.6)

## 2023-08-20 LAB — TSH: TSH: 2.24 u[IU]/mL (ref 0.450–4.500)

## 2023-08-24 ENCOUNTER — Encounter: Payer: Self-pay | Admitting: Family Medicine

## 2023-08-24 ENCOUNTER — Ambulatory Visit (INDEPENDENT_AMBULATORY_CARE_PROVIDER_SITE_OTHER): Payer: 59 | Admitting: Family Medicine

## 2023-08-24 VITALS — BP 138/82 | HR 55 | Ht 65.0 in | Wt 187.0 lb

## 2023-08-24 DIAGNOSIS — I10 Essential (primary) hypertension: Secondary | ICD-10-CM | POA: Diagnosis not present

## 2023-08-24 DIAGNOSIS — R7302 Impaired glucose tolerance (oral): Secondary | ICD-10-CM

## 2023-08-24 DIAGNOSIS — E66811 Obesity, class 1: Secondary | ICD-10-CM

## 2023-08-24 DIAGNOSIS — M65311 Trigger thumb, right thumb: Secondary | ICD-10-CM | POA: Diagnosis not present

## 2023-08-24 DIAGNOSIS — E785 Hyperlipidemia, unspecified: Secondary | ICD-10-CM | POA: Diagnosis not present

## 2023-08-24 DIAGNOSIS — M5442 Lumbago with sciatica, left side: Secondary | ICD-10-CM

## 2023-08-24 DIAGNOSIS — G459 Transient cerebral ischemic attack, unspecified: Secondary | ICD-10-CM

## 2023-08-24 DIAGNOSIS — G8929 Other chronic pain: Secondary | ICD-10-CM

## 2023-08-24 MED ORDER — PROMETHAZINE-DM 6.25-15 MG/5ML PO SYRP: ORAL_SOLUTION | ORAL | 0 refills | Status: DC

## 2023-08-24 MED ORDER — MELOXICAM 7.5 MG PO TABS: ORAL_TABLET | ORAL | 0 refills | Status: DC

## 2023-08-24 NOTE — Patient Instructions (Signed)
Fd/u in 5 monhts, call if you need me sooner  Commit to twice weekly crestor  CONGRATS on improved health  New for back pain is limited meloxoicam  You are referred to Dr Amanda Pea re right trigger thumb  Fasting lipid, cmp and eGGFR, hBA1c 3 to 5 days before next appt  It is important that you exercise regularly at least 30 minutes 5 times a week. If you develop chest pain, have severe difficulty breathing, or feel very tired, stop exercising immediately and seek medical attention   Thanks for choosing [facility], we consider it a privelige to serve you.

## 2023-09-13 DIAGNOSIS — M549 Dorsalgia, unspecified: Secondary | ICD-10-CM | POA: Insufficient documentation

## 2023-09-13 DIAGNOSIS — M65311 Trigger thumb, right thumb: Secondary | ICD-10-CM | POA: Insufficient documentation

## 2023-09-13 NOTE — Assessment & Plan Note (Signed)
Patient educated about the importance of limiting  Carbohydrate intake , the need to commit to daily physical activity for a minimum of 30 minutes , and to commit weight loss. The fact that changes in all these areas will reduce or eliminate all together the development of diabetes is stressed.      Latest Ref Rng & Units 08/19/2023   11:14 AM 04/23/2023    9:34 AM 01/22/2023    5:10 AM 01/21/2023    8:57 PM 10/02/2022   10:58 PM  Diabetic Labs  HbA1c 4.8 - 5.6 % 6.1  6.3  6.3     Chol 100 - 199 mg/dL 161  096  045     HDL >40 mg/dL 61  53  60     Calc LDL 0 - 99 mg/dL 981  191  478     Triglycerides 0 - 149 mg/dL 67  295  66     Creatinine 0.57 - 1.00 mg/dL 6.21  3.08  6.57  8.46  1.07       08/24/2023    4:22 PM 04/27/2023    3:57 PM 04/02/2023    8:54 AM 01/26/2023    4:06 PM 01/23/2023    8:26 AM 01/23/2023    4:06 AM 01/22/2023   11:39 PM  BP/Weight  Systolic BP 138 127 128 126 111 109 110  Diastolic BP 82 75 84 83 67 80 62  Wt. (Lbs) 187 194 193 196.04     BMI 31.12 kg/m2 32.28 kg/m2 32.12 kg/m2 32.62 kg/m2          No data to display          Improved , she is applauded on this Updated lab needed at/ before next visit.

## 2023-09-13 NOTE — Progress Notes (Signed)
Grace Valdez     MRN: [id number]      DOB: 1965/10/26  Chief Complaint  Patient presents with   Follow-up    Follow up discuss labs    HPI Ms. Cardiff is here for follow up and re-evaluation of chronic medical conditions, medication management and review of any available recent lab and radiology data.  Preventive health is updated, specifically  Cancer screening and Immunization.   Questions or concerns regarding consultations or procedures which the PT has had in the interim are  addressed. The PT denies any adverse reactions to current medications since the last visit.  There are no new concerns.  There are no specific complaints   ROS Denies recent fever or chills. Denies sinus pressure, nasal congestion, ear pain or sore throat. Denies chest congestion, productive cough or wheezing. Denies chest pains, palpitations and leg swelling Denies abdominal pain, nausea, vomiting,diarrhea or constipation.   Denies dysuria, frequency, hesitancy or incontinence. Denies joint pain, swelling and limitation in mobility. Denies headaches, seizures, numbness, or tingling. Denies depression, anxiety or insomnia. Denies skin break down or rash.   PE  BP 138/82 (BP Location: Right Arm, Patient Position: Sitting, Cuff Size: Large)   Pulse (!) 55   Ht 5\' 5"  (1.651 m)   Wt 187 lb (84.8 kg)   LMP 01/24/2013   SpO2 94%   BMI 31.12 kg/m   Patient alert and oriented and in no cardiopulmonary distress.  HEENT: No facial asymmetry, EOMI,     Neck supple .  Chest: Clear to auscultation bilaterally.  CVS: S1, S2 no murmurs, no S3.Regular rate.  ABD: Soft non tender.   Ext: No edema  MS: Adequate ROM spine, shoulders, hips and knees.  Skin: Intact, no ulcerations or rash noted.  Psych: Good eye contact, normal affect. Memory intact not anxious or depressed appearing.  CNS: CN 2-12 intact, power,  normal throughout.no focal deficits noted.   Assessment & Plan  Trigger thumb, right  thumb Refer ortho for management  Back pain Intermittent low back pain radiating to left hip, will rx meloxicam limiting to twice weekly use   Essential hypertension Elevated , not at goal, pt resisting dose increase states she gets better values when checked outside of the officew DASH diet and commitment to daily physical activity for a minimum of 30 minutes discussed and encouraged, as a part of hypertension management. The importance of attaining a healthy weight is also discussed.     08/24/2023    4:22 PM 04/27/2023    3:57 PM 04/02/2023    8:54 AM 01/26/2023    4:06 PM 01/23/2023    8:26 AM 01/23/2023    4:06 AM 01/22/2023   11:39 PM  BP/Weight  Systolic BP 138 127 128 126 111 109 110  Diastolic BP 82 75 84 83 67 80 62  Wt. (Lbs) 187 194 193 196.04     BMI 31.12 kg/m2 32.28 kg/m2 32.12 kg/m2 32.62 kg/m2          Hyperlipidemia LDL goal <70 Hyperlipidemia:Low fat diet discussed and encouraged.   Lipid Panel  Lab Results  Component Value Date   CHOL 200 (H) 08/19/2023   HDL 61 08/19/2023   LDLCALC 127 (H) 08/19/2023   TRIG 67 08/19/2023   CHOLHDL 3.3 08/19/2023     Deteriorated, non compliant wit statin, will commit to taking regualrly at least 2 times per week, denies muscle pain with statin Updated lab needed at/ before next visit.  IGT (impaired glucose tolerance) Patient educated about the importance of limiting  Carbohydrate intake , the need to commit to daily physical activity for a minimum of 30 minutes , and to commit weight loss. The fact that changes in all these areas will reduce or eliminate all together the development of diabetes is stressed.      Latest Ref Rng & Units 08/19/2023   11:14 AM 04/23/2023    9:34 AM 01/22/2023    5:10 AM 01/21/2023    8:57 PM 10/02/2022   10:58 PM  Diabetic Labs  HbA1c 4.8 - 5.6 % 6.1  6.3  6.3     Chol 100 - 199 mg/dL 130  865  784     HDL >69 mg/dL 61  53  60     Calc LDL 0 - 99 mg/dL 629  528  413      Triglycerides 0 - 149 mg/dL 67  244  66     Creatinine 0.57 - 1.00 mg/dL 0.10  2.72  5.36  6.44  1.07       08/24/2023    4:22 PM 04/27/2023    3:57 PM 04/02/2023    8:54 AM 01/26/2023    4:06 PM 01/23/2023    8:26 AM 01/23/2023    4:06 AM 01/22/2023   11:39 PM  BP/Weight  Systolic BP 138 127 128 126 111 109 110  Diastolic BP 82 75 84 83 67 80 62  Wt. (Lbs) 187 194 193 196.04     BMI 31.12 kg/m2 32.28 kg/m2 32.12 kg/m2 32.62 kg/m2          No data to display          Improved , she is applauded on this Updated lab needed at/ before next visit.   Obesity (BMI 30.0-34.9)  Patient re-educated about  the importance of commitment to a  minimum of 150 minutes of exercise per week as able.  The importance of healthy food choices with portion control discussed, as well as eating regularly and within a 12 hour window most days. The need to choose "clean , green" food 50 to 75% of the time is discussed, as well as to make water the primary drink and set a goal of 64 ounces water daily.       08/24/2023    4:22 PM 04/27/2023    3:57 PM 04/02/2023    8:54 AM  Weight /BMI  Weight 187 lb 194 lb 193 lb  Height 5\' 5"  (1.651 m) 5\' 5"  (1.651 m) 5\' 5"  (1.651 m)  BMI 31.12 kg/m2 32.28 kg/m2 32.12 kg/m2    Improved which is great, encouraged  keep going in this direction  TIA (transient ischemic attack) Denies any recurrent neurologic symptoms

## 2023-09-13 NOTE — Assessment & Plan Note (Signed)
  Patient re-educated about  the importance of commitment to a  minimum of 150 minutes of exercise per week as able.  The importance of healthy food choices with portion control discussed, as well as eating regularly and within a 12 hour window most days. The need to choose "clean , green" food 50 to 75% of the time is discussed, as well as to make water the primary drink and set a goal of 64 ounces water daily.       08/24/2023    4:22 PM 04/27/2023    3:57 PM 04/02/2023    8:54 AM  Weight /BMI  Weight 187 lb 194 lb 193 lb  Height 5\' 5"  (1.651 m) 5\' 5"  (1.651 m) 5\' 5"  (1.651 m)  BMI 31.12 kg/m2 32.28 kg/m2 32.12 kg/m2    Improved which is great, encouraged  keep going in this direction

## 2023-09-13 NOTE — Assessment & Plan Note (Signed)
Hyperlipidemia:Low fat diet discussed and encouraged.   Lipid Panel  Lab Results  Component Value Date   CHOL 200 (H) 08/19/2023   HDL 61 08/19/2023   LDLCALC 127 (H) 08/19/2023   TRIG 67 08/19/2023   CHOLHDL 3.3 08/19/2023     Deteriorated, non compliant wit statin, will commit to taking regualrly at least 2 times per week, denies muscle pain with statin Updated lab needed at/ before next visit.

## 2023-09-13 NOTE — Assessment & Plan Note (Signed)
Intermittent low back pain radiating to left hip, will rx meloxicam limiting to twice weekly use

## 2023-09-13 NOTE — Assessment & Plan Note (Signed)
Denies any recurrent neurologic symptoms

## 2023-09-13 NOTE — Assessment & Plan Note (Signed)
Elevated , not at goal, pt resisting dose increase states she gets better values when checked outside of the officew DASH diet and commitment to daily physical activity for a minimum of 30 minutes discussed and encouraged, as a part of hypertension management. The importance of attaining a healthy weight is also discussed.     08/24/2023    4:22 PM 04/27/2023    3:57 PM 04/02/2023    8:54 AM 01/26/2023    4:06 PM 01/23/2023    8:26 AM 01/23/2023    4:06 AM 01/22/2023   11:39 PM  BP/Weight  Systolic BP 138 127 128 126 111 109 110  Diastolic BP 82 75 84 83 67 80 62  Wt. (Lbs) 187 194 193 196.04     BMI 31.12 kg/m2 32.28 kg/m2 32.12 kg/m2 32.62 kg/m2

## 2023-09-13 NOTE — Assessment & Plan Note (Signed)
Refer ortho for management

## 2023-09-19 ENCOUNTER — Other Ambulatory Visit: Payer: Self-pay | Admitting: Family Medicine

## 2023-10-17 ENCOUNTER — Other Ambulatory Visit: Payer: Self-pay | Admitting: Family Medicine

## 2023-11-16 ENCOUNTER — Other Ambulatory Visit: Payer: Self-pay | Admitting: Family Medicine

## 2023-12-07 ENCOUNTER — Other Ambulatory Visit (HOSPITAL_COMMUNITY): Payer: Self-pay | Admitting: Family Medicine

## 2023-12-07 DIAGNOSIS — Z1231 Encounter for screening mammogram for malignant neoplasm of breast: Secondary | ICD-10-CM

## 2023-12-14 ENCOUNTER — Other Ambulatory Visit: Payer: Self-pay | Admitting: Family Medicine

## 2023-12-15 ENCOUNTER — Encounter: Payer: Self-pay | Admitting: Family Medicine

## 2023-12-15 ENCOUNTER — Ambulatory Visit: Payer: Self-pay

## 2023-12-15 ENCOUNTER — Ambulatory Visit (INDEPENDENT_AMBULATORY_CARE_PROVIDER_SITE_OTHER): Admitting: Family Medicine

## 2023-12-15 VITALS — BP 138/86 | Resp 12 | Ht 65.0 in | Wt 183.0 lb

## 2023-12-15 DIAGNOSIS — I10 Essential (primary) hypertension: Secondary | ICD-10-CM | POA: Diagnosis not present

## 2023-12-15 DIAGNOSIS — E66811 Obesity, class 1: Secondary | ICD-10-CM

## 2023-12-15 DIAGNOSIS — N3090 Cystitis, unspecified without hematuria: Secondary | ICD-10-CM | POA: Insufficient documentation

## 2023-12-15 MED ORDER — SULFAMETHOXAZOLE-TRIMETHOPRIM 800-160 MG PO TABS: 1.0000 | ORAL_TABLET | Freq: Two times a day (BID) | ORAL | 0 refills | Status: DC

## 2023-12-15 MED ORDER — FLUCONAZOLE 150 MG PO TABS: 150.0000 mg | ORAL_TABLET | Freq: Once | ORAL | 0 refills | Status: AC

## 2023-12-15 NOTE — Patient Instructions (Signed)
 F/U as before , call if you need me sooner  Urine to be sent for culture today  3 day antibiotic course of antibiotic, septra is prescribed, push water and empty often  It is important that you exercise regularly at least 30 minutes 5 times a week. If you develop chest pain, have severe difficulty breathing, or feel very tired, stop exercising immediately and seek medical attention   No cigar cerebrations! Thanks for choosing [facility], we consider it a privelige to serve you.

## 2023-12-15 NOTE — Assessment & Plan Note (Signed)
 DASH diet and commitment to daily physical activity for a minimum of 30 minutes discussed and encouraged, as a part of hypertension management. The importance of attaining a healthy weight is also discussed.     12/15/2023    2:52 PM 08/24/2023    4:22 PM 04/27/2023    3:57 PM 04/02/2023    8:54 AM 01/26/2023    4:06 PM 01/23/2023    8:26 AM 01/23/2023    4:06 AM  BP/Weight  Systolic BP 138 138 127 128 126 111 109  Diastolic BP 86 82 75 84 83 67 80  Wt. (Lbs) 183.04 187 194 193 196.04    BMI 30.46 kg/m2 31.12 kg/m2 32.28 kg/m2 32.12 kg/m2 32.62 kg/m2       Sub optimal control No med change , will re address in 5 weeks

## 2023-12-15 NOTE — Telephone Encounter (Signed)
 Chief Complaint: Urine Frequency  Symptoms: increased urinary frequency, pressure with urination, foul urine odor  Frequency: Constant since Sunday Pertinent Negatives: Patient denies fever, blood in urine, flank pain, back pain, nausea, vomiting Disposition: [] ED /[x] Urgent Care (no appt availability in office) / [] Appointment(In office/virtual)/ []  [facility]/ [] Home Care/ [] Refused Recommended Disposition /[]  Mobile Bus/ []  Follow-up with PCP Additional Notes: Patient states she has had pressure with urination since Sunday. Patient also reports increased frequency, foul urine odor and dark colored urine. Care advice was given and  urine care recommended due to no appointment availability in patient's area within recommended disposition. Patient states she only wants to see her PCP but if she is not available she will go to Urgent care if symptoms get worse. Patient states she is not going right now.  Copied from CRM [id number]. Topic: Clinical - Red Word Triage >> Dec 15, 2023 10:04 AM Nyra Capes wrote: Red Word that prompted transfer to Nurse Triage: patient calling in, possible UTI, has pressure and frequent urination, more pressure than pain. >> Dec 15, 2023 10:14 AM Nyra Capes wrote: Nurse Triage, Kia stated to inform patient to go to an urgent care. Patient stated she will only see her provider.  Reason for Disposition  Urinating more frequently than usual (i.e., frequency)  Answer Assessment - Initial Assessment Questions 1. SYMPTOM: "What's the main symptom you're concerned about?" (e.g., frequency, incontinence)     FREQUENCY 2. ONSET: "When did the  frequency  start?"     Sunday  3. PAIN: "Is there any pain?" If Yes, ask: "How bad is it?" (Scale: 1-10; mild, moderate, severe)     Moderate 4. CAUSE: "What do you think is causing the symptoms?"     UTI 5. OTHER SYMPTOMS: "Do you have any other symptoms?" (e.g., blood in urine, fever, flank pain, pain with urination)      Pressure, increased frequency, foul odor  6. PREGNANCY: "Is there any chance you are pregnant?" "When was your last menstrual period?"     N/A  Protocols used: Urinary Symptoms-A-AH

## 2023-12-15 NOTE — Assessment & Plan Note (Addendum)
 3 day history, urine c/s and septra x 3 days, call back if symptoms persist or worsen

## 2023-12-15 NOTE — Progress Notes (Signed)
   Grace Valdez     MRN: [id number]      DOB: [birth year]  No chief complaint on file.   HPI Grace Valdez is here with 3 day h/o frequency, pain , dark urine  no fever , cjills , flank pain , nausea or emesis  ROS . See HPI No other concerns  PE BP 138/86   Resp 12   Ht 5\' 5"  (1.651 m)   Wt 183 lb 0.6 oz (83 kg)   LMP 01/24/2013   BMI 30.46 kg/m    BP 138/86   Resp 12   Ht 5\' 5"  (1.651 m)   Wt 183 lb 0.6 oz (83 kg)   LMP 01/24/2013   BMI 30.46 kg/m   Patient alert and oriented and in no cardiopulmonary distress.  HEENT: No facial asymmetry, EOMI,     Neck supple .  Chest: Clear  CVS: S1, S2  no S3.Regular rate.  ABD: Soft non tender.   Ext: No edema  MS: Adequate ROM spine, shoulders, hips and knees.  Skin: Intact, no ulcerations or rash noted.  CNS: CN 2-12 intact, power,  normal throughout.no focal deficits noted.   Assessment & Plan  Cystitis 3 day history, urine c/s and septra x 3 days, call back if symptoms persist or worsen  Essential hypertension DASH diet and commitment to daily physical activity for a minimum of 30 minutes discussed and encouraged, as a part of hypertension management. The importance of attaining a healthy weight is also discussed.     12/15/2023    2:52 PM 08/24/2023    4:22 PM 04/27/2023    3:57 PM 04/02/2023    8:54 AM 01/26/2023    4:06 PM 01/23/2023    8:26 AM 01/23/2023    4:06 AM  BP/Weight  Systolic BP 138 138 127 128 126 111 109  Diastolic BP 86 82 75 84 83 67 80  Wt. (Lbs) 183.04 187 194 193 196.04    BMI 30.46 kg/m2 31.12 kg/m2 32.28 kg/m2 32.12 kg/m2 32.62 kg/m2       Sub optimal control No med change , will re address in 5 weeks  Obesity (BMI 30.0-34.9)  Patient re-educated about  the importance of commitment to a  minimum of 150 minutes of exercise per week as able.  The importance of healthy food choices with portion control discussed, as well as eating regularly and within a 12 hour window most days. The  need to choose "clean , green" food 50 to 75% of the time is discussed, as well as to make water the primary drink and set a goal of 64 ounces water daily.       12/15/2023    2:52 PM 08/24/2023    4:22 PM 04/27/2023    3:57 PM  Weight /BMI  Weight 183 lb 0.6 oz 187 lb 194 lb  Height 5\' 5"  (1.651 m) 5\' 5"  (1.651 m) 5\' 5"  (1.651 m)  BMI 30.46 kg/m2 31.12 kg/m2 32.28 kg/m2    Improving graduually with lifestyle change , encouraged to continue this

## 2023-12-15 NOTE — Assessment & Plan Note (Signed)
  Patient re-educated about  the importance of commitment to a  minimum of 150 minutes of exercise per week as able.  The importance of healthy food choices with portion control discussed, as well as eating regularly and within a 12 hour window most days. The need to choose "clean , green" food 50 to 75% of the time is discussed, as well as to make water the primary drink and set a goal of 64 ounces water daily.       12/15/2023    2:52 PM 08/24/2023    4:22 PM 04/27/2023    3:57 PM  Weight /BMI  Weight 183 lb 0.6 oz 187 lb 194 lb  Height 5\' 5"  (1.651 m) 5\' 5"  (1.651 m) 5\' 5"  (1.651 m)  BMI 30.46 kg/m2 31.12 kg/m2 32.28 kg/m2    Improving graduually with lifestyle change , encouraged to continue this

## 2023-12-21 ENCOUNTER — Encounter: Payer: Self-pay | Admitting: Family Medicine

## 2023-12-21 LAB — URINE CULTURE

## 2024-01-09 ENCOUNTER — Other Ambulatory Visit: Payer: Self-pay | Admitting: Family Medicine

## 2024-01-10 ENCOUNTER — Ambulatory Visit (HOSPITAL_COMMUNITY)
Admission: RE | Admit: 2024-01-10 | Discharge: 2024-01-10 | Disposition: A | Discharge status: Home / Self Care | Source: Ambulatory Visit | Attending: Family Medicine | Admitting: Family Medicine

## 2024-01-10 DIAGNOSIS — Z1231 Encounter for screening mammogram for malignant neoplasm of breast: Secondary | ICD-10-CM | POA: Insufficient documentation

## 2024-01-12 ENCOUNTER — Other Ambulatory Visit (HOSPITAL_COMMUNITY): Payer: Self-pay | Admitting: Family Medicine

## 2024-01-12 DIAGNOSIS — R928 Other abnormal and inconclusive findings on diagnostic imaging of breast: Secondary | ICD-10-CM

## 2024-01-18 ENCOUNTER — Ambulatory Visit (HOSPITAL_COMMUNITY)
Admission: RE | Admit: 2024-01-18 | Discharge: 2024-01-18 | Disposition: A | Discharge status: Home / Self Care | Source: Ambulatory Visit | Attending: Family Medicine | Admitting: Family Medicine

## 2024-01-18 ENCOUNTER — Encounter (HOSPITAL_COMMUNITY): Payer: Self-pay

## 2024-01-18 ENCOUNTER — Other Ambulatory Visit: Payer: Self-pay

## 2024-01-18 DIAGNOSIS — R928 Other abnormal and inconclusive findings on diagnostic imaging of breast: Secondary | ICD-10-CM

## 2024-01-21 LAB — CMP14+EGFR
ALT: 13 IU/L (ref 0–32)
AST: 20 IU/L (ref 0–40)
Albumin: 4.4 g/dL (ref 3.8–4.9)
Alkaline Phosphatase: 72 IU/L (ref 44–121)
BUN/Creatinine Ratio: 18 (ref 12–28)
BUN: 20 mg/dL (ref 8–27)
Bilirubin Total: 0.3 mg/dL (ref 0.0–1.2)
CO2: 23 mmol/L (ref 20–29)
Calcium: 9.5 mg/dL (ref 8.7–10.3)
Chloride: 103 mmol/L (ref 96–106)
Creatinine, Ser: 1.12 mg/dL — ABNORMAL HIGH (ref 0.57–1.00)
Globulin, Total: 2.4 g/dL (ref 1.5–4.5)
Glucose: 103 mg/dL — ABNORMAL HIGH (ref 70–99)
Potassium: 4.2 mmol/L (ref 3.5–5.2)
Sodium: 140 mmol/L (ref 134–144)
Total Protein: 6.8 g/dL (ref 6.0–8.5)
eGFR: 56 mL/min/{1.73_m2} — ABNORMAL LOW (ref >59)

## 2024-01-21 LAB — LIPID PANEL
Chol/HDL Ratio: 2.6 ratio (ref 0.0–4.4)
Cholesterol, Total: 169 mg/dL (ref 100–199)
HDL: 65 mg/dL (ref >39)
LDL Chol Calc (NIH): 91 mg/dL (ref 0–99)
Triglycerides: 66 mg/dL (ref 0–149)
VLDL Cholesterol Cal: 13 mg/dL (ref 5–40)

## 2024-01-21 LAB — HEMOGLOBIN A1C
Est. average glucose Bld gHb Est-mCnc: 128 mg/dL
Hgb A1c MFr Bld: 6.1 % — ABNORMAL HIGH (ref 4.8–5.6)

## 2024-01-25 ENCOUNTER — Ambulatory Visit: Payer: 59 | Admitting: Family Medicine

## 2024-02-07 ENCOUNTER — Other Ambulatory Visit: Payer: Self-pay | Admitting: Family Medicine

## 2024-02-24 ENCOUNTER — Ambulatory Visit (INDEPENDENT_AMBULATORY_CARE_PROVIDER_SITE_OTHER): Admitting: Family Medicine

## 2024-02-24 ENCOUNTER — Encounter: Payer: Self-pay | Admitting: Family Medicine

## 2024-02-24 VITALS — BP 139/84 | HR 67 | Resp 16 | Ht 65.0 in | Wt 183.0 lb

## 2024-02-24 DIAGNOSIS — M25552 Pain in left hip: Secondary | ICD-10-CM | POA: Diagnosis not present

## 2024-02-24 DIAGNOSIS — E559 Vitamin D deficiency, unspecified: Secondary | ICD-10-CM

## 2024-02-24 DIAGNOSIS — R7302 Impaired glucose tolerance (oral): Secondary | ICD-10-CM

## 2024-02-24 DIAGNOSIS — E66811 Obesity, class 1: Secondary | ICD-10-CM

## 2024-02-24 DIAGNOSIS — M5440 Lumbago with sciatica, unspecified side: Secondary | ICD-10-CM | POA: Diagnosis not present

## 2024-02-24 DIAGNOSIS — M25559 Pain in unspecified hip: Secondary | ICD-10-CM | POA: Insufficient documentation

## 2024-02-24 DIAGNOSIS — G8929 Other chronic pain: Secondary | ICD-10-CM

## 2024-02-24 DIAGNOSIS — E785 Hyperlipidemia, unspecified: Secondary | ICD-10-CM

## 2024-02-24 DIAGNOSIS — I10 Essential (primary) hypertension: Secondary | ICD-10-CM

## 2024-02-24 MED ORDER — ROSUVASTATIN CALCIUM 5 MG PO TABS: 5.0000 mg | ORAL_TABLET | Freq: Every day | ORAL | 1 refills | Status: DC

## 2024-02-24 NOTE — Assessment & Plan Note (Addendum)
 Radiaites to right leg, tingling worsening over time, refer Ortho

## 2024-02-24 NOTE — Patient Instructions (Addendum)
 Annual exam in 4 months, call if you need me sooner  Fasting lipid, cmp and eGFr, HBA1C, CBC, TSH and vit D 1 week before next appointment  (Nurse please order)  Please commit totaking cholesterol med every day  Please change food choice to mainly vegetable and fruit , beans and white non fatty meats like chicken and fish  You are referred to Orthopedics re back and hip pain, their office will contact you with the appointment  Thanks for choosing [facility], we consider it a privelige to serve you.

## 2024-02-24 NOTE — Assessment & Plan Note (Signed)
 Disabling limits walking

## 2024-02-24 NOTE — Assessment & Plan Note (Signed)
 Updated lab needed at/ before next visit. Skight improveemnt inast 5 months Patient educated about the importance of limiting  Carbohydrate intake , the need to commit to daily physical activity for a minimum of 30 minutes , and to commit weight loss. The fact that changes in all these areas will reduce or eliminate all together the development of diabetes is stressed.      Latest Ref Rng & Units 01/20/2024    8:19 AM 08/19/2023   11:14 AM 04/23/2023    9:34 AM 01/22/2023    5:10 AM 01/21/2023    8:57 PM  Diabetic Labs  HbA1c 4.8 - 5.6 % 6.1  6.1  6.3  6.3    Chol 100 - 199 mg/dL 865  784  696  295    HDL >39 mg/dL 65  61  53  60    Calc LDL 0 - 99 mg/dL 91  284  132  440    Triglycerides 0 - 149 mg/dL 66  67  102  66    Creatinine 0.57 - 1.00 mg/dL 7.25  3.66  4.40  3.47  1.02       02/24/2024   11:17 AM 12/15/2023    2:52 PM 08/24/2023    4:22 PM 04/27/2023    3:57 PM 04/02/2023    8:54 AM 01/26/2023    4:06 PM 01/23/2023    8:26 AM  BP/Weight  Systolic BP 139 138 138 127 128 126 111  Diastolic BP 84 86 82 75 84 83 67  Wt. (Lbs) 183.04 183.04 187 194 193 196.04   BMI 30.46 kg/m2 30.46 kg/m2 31.12 kg/m2 32.28 kg/m2 32.12 kg/m2 32.62 kg/m2        No data to display

## 2024-02-24 NOTE — Assessment & Plan Note (Signed)
 Updated lab needed at/ before next visit.

## 2024-02-24 NOTE — Progress Notes (Signed)
 Grace Valdez     MRN: [id number]      DOB: 07-Jun-1966  Chief Complaint  Patient presents with   Medical Management of Chronic Issues    5 month follow up     HPI Grace Valdez is here for follow up and re-evaluation of chronic medical conditions, medication management and review of any available recent lab and radiology data.  Preventive health is updated, specifically  Cancer screening and Immunization.   C/o chronic and progressively worsening low back pain radiating down both legs as well as left hip pain which limits her her mobility and exercise routine and at times interferes with her sleep. Denies numbness, weaknes in extremities, denies inconinence ROS Denies recent fever or chills. Denies sinus pressure, nasal congestion, ear pain or sore throat. Denies chest congestion, productive cough or wheezing. Denies chest pains, palpitations and leg swelling Denies abdominal pain, nausea, vomiting,diarrhea or constipation.   Denies dysuria, frequency, hesitancy or incontinence.  Denies headaches, seizures, numbness, or tingling. Denies depression, anxiety Denies skin break down or rash.   PE  BP 139/84   Pulse 67   Resp 16   Ht 5' 5 (1.651 m)   Wt 183 lb 0.6 oz (83 kg)   LMP 01/24/2013   SpO2 98%   BMI 30.46 kg/m   Patient alert and oriented and in no cardiopulmonary distress.  HEENT: No facial asymmetry, EOMI,     Neck supple .  Chest: Clear to auscultation bilaterally.  CVS: S1, S2 no murmurs, no S3.Regular rate.  ABD: Soft non tender.   Ext: No edema  MS: decreased ROM spine, and left , hip and normal in shoulders and knees.  Skin: Intact, no ulcerations or rash noted.  Psych: Good eye contact, normal affect. Memory intact not anxious or depressed appearing.  CNS: CN 2-12 intact, power,  normal throughout.no focal deficits noted.   Assessment & Plan Hip pain Disabling limits walking  Back pain Radiaites to right leg, tingling worsening over time, refer  Ortho  Essential hypertension Not at goal, pt resistent to additional ,med , will work on lifestyle and weight loss DASH diet and commitment to daily physical activity for a minimum of 30 minutes discussed and encouraged, as a part of hypertension management. The importance of attaining a healthy weight is also discussed.     02/24/2024   11:17 AM 12/15/2023    2:52 PM 08/24/2023    4:22 PM 04/27/2023    3:57 PM 04/02/2023    8:54 AM 01/26/2023    4:06 PM 01/23/2023    8:26 AM  BP/Weight  Systolic BP 139 138 138 127 128 126 111  Diastolic BP 84 86 82 75 84 83 67  Wt. (Lbs) 183.04 183.04 187 194 193 196.04   BMI 30.46 kg/m2 30.46 kg/m2 31.12 kg/m2 32.28 kg/m2 32.12 kg/m2 32.62 kg/m2     '  Hyperlipidemia LDL goal <70 Hyperlipidemia:Low fat diet discussed and encouraged.   Lipid Panel  Lab Results  Component Value Date   CHOL 169 01/20/2024   HDL 65 01/20/2024   LDLCALC 91 01/20/2024   TRIG 66 01/20/2024   CHOLHDL 2.6 01/20/2024     Not taking med consisently will do so as not at goal  Obesity (BMI 30.0-34.9)  Patient re-educated about  the importance of commitment to a  minimum of 150 minutes of exercise per week as able.  The importance of healthy food choices with portion control discussed, as well as eating regularly and  within a 12 hour window most days. The need to choose clean , green food 50 to 75% of the time is discussed, as well as to make water the primary drink and set a goal of 64 ounces water daily.       02/24/2024   11:17 AM 12/15/2023    2:52 PM 08/24/2023    4:22 PM  Weight /BMI  Weight 183 lb 0.6 oz 183 lb 0.6 oz 187 lb  Height 5' 5 (1.651 m) 5' 5 (1.651 m) 5' 5 (1.651 m)  BMI 30.46 kg/m2 30.46 kg/m2 31.12 kg/m2    unchanged  Vitamin D  deficiency Updated lab needed at/ before next visit.   IGT (impaired glucose tolerance) Updated lab needed at/ before next visit. Skight improveemnt inast 5 months Patient educated about the importance  of limiting  Carbohydrate intake , the need to commit to daily physical activity for a minimum of 30 minutes , and to commit weight loss. The fact that changes in all these areas will reduce or eliminate all together the development of diabetes is stressed.      Latest Ref Rng & Units 01/20/2024    8:19 AM 08/19/2023   11:14 AM 04/23/2023    9:34 AM 01/22/2023    5:10 AM 01/21/2023    8:57 PM  Diabetic Labs  HbA1c 4.8 - 5.6 % 6.1  6.1  6.3  6.3    Chol 100 - 199 mg/dL 454  098  119  147    HDL >39 mg/dL 65  61  53  60    Calc LDL 0 - 99 mg/dL 91  829  562  130    Triglycerides 0 - 149 mg/dL 66  67  865  66    Creatinine 0.57 - 1.00 mg/dL 7.84  6.96  2.95  2.84  1.02       02/24/2024   11:17 AM 12/15/2023    2:52 PM 08/24/2023    4:22 PM 04/27/2023    3:57 PM 04/02/2023    8:54 AM 01/26/2023    4:06 PM 01/23/2023    8:26 AM  BP/Weight  Systolic BP 139 138 138 127 128 126 111  Diastolic BP 84 86 82 75 84 83 67  Wt. (Lbs) 183.04 183.04 187 194 193 196.04   BMI 30.46 kg/m2 30.46 kg/m2 31.12 kg/m2 32.28 kg/m2 32.12 kg/m2 32.62 kg/m2        No data to display           '

## 2024-02-24 NOTE — Assessment & Plan Note (Signed)
  Patient re-educated about  the importance of commitment to a  minimum of 150 minutes of exercise per week as able.  The importance of healthy food choices with portion control discussed, as well as eating regularly and within a 12 hour window most days. The need to choose clean , green food 50 to 75% of the time is discussed, as well as to make water the primary drink and set a goal of 64 ounces water daily.       02/24/2024   11:17 AM 12/15/2023    2:52 PM 08/24/2023    4:22 PM  Weight /BMI  Weight 183 lb 0.6 oz 183 lb 0.6 oz 187 lb  Height 5' 5 (1.651 m) 5' 5 (1.651 m) 5' 5 (1.651 m)  BMI 30.46 kg/m2 30.46 kg/m2 31.12 kg/m2    unchanged

## 2024-02-24 NOTE — Assessment & Plan Note (Signed)
 Hyperlipidemia:Low fat diet discussed and encouraged.   Lipid Panel  Lab Results  Component Value Date   CHOL 169 01/20/2024   HDL 65 01/20/2024   LDLCALC 91 01/20/2024   TRIG 66 01/20/2024   CHOLHDL 2.6 01/20/2024     Not taking med consisently will do so as not at goal

## 2024-02-24 NOTE — Assessment & Plan Note (Signed)
 Not at goal, pt resistent to additional ,med , will work on lifestyle and weight loss DASH diet and commitment to daily physical activity for a minimum of 30 minutes discussed and encouraged, as a part of hypertension management. The importance of attaining a healthy weight is also discussed.     02/24/2024   11:17 AM 12/15/2023    2:52 PM 08/24/2023    4:22 PM 04/27/2023    3:57 PM 04/02/2023    8:54 AM 01/26/2023    4:06 PM 01/23/2023    8:26 AM  BP/Weight  Systolic BP 139 138 138 127 128 126 111  Diastolic BP 84 86 82 75 84 83 67  Wt. (Lbs) 183.04 183.04 187 194 193 196.04   BMI 30.46 kg/m2 30.46 kg/m2 31.12 kg/m2 32.28 kg/m2 32.12 kg/m2 32.62 kg/m2     '

## 2024-03-04 ENCOUNTER — Other Ambulatory Visit: Payer: Self-pay | Admitting: Family Medicine

## 2024-03-14 ENCOUNTER — Encounter: Payer: Self-pay | Admitting: Orthopedic Surgery

## 2024-03-14 ENCOUNTER — Other Ambulatory Visit (INDEPENDENT_AMBULATORY_CARE_PROVIDER_SITE_OTHER)

## 2024-03-14 ENCOUNTER — Ambulatory Visit: Admitting: Orthopedic Surgery

## 2024-03-14 VITALS — BP 120/72 | HR 65 | Ht 65.0 in | Wt 186.0 lb

## 2024-03-14 DIAGNOSIS — M545 Low back pain, unspecified: Secondary | ICD-10-CM

## 2024-03-14 NOTE — Patient Instructions (Signed)

## 2024-03-14 NOTE — Progress Notes (Signed)
 New Patient Visit  Assessment: Grace Valdez is a 58 y.o. female with the following: 1. Lumbar pain  Plan: Gelisa Tieken Askari has primarily axial lower back pain.  Occasional radiating symptoms.  Pain is intermittent.  Radiographs demonstrates mild anterolisthesis at L3-4, with advanced degenerative changes at L4-5.  This was reviewed with the patient.  She will continue with medications as needed.  She we will continue to use topical treatments.  She can use a knee pad.  I provided her with back exercises that she can initiate.  If she continues to have issues, we can also consider a formal referral to spine specialist.  She states understanding.  She will follow-up as needed.  Follow-up: Return if symptoms worsen or fail to improve.  Subjective:  Chief Complaint  Patient presents with   Back Pain    LBP radiating down the L leg     History of Present Illness: Grace Valdez is a 58 y.o. female who has been referred by Rollene Pesa, MD for evaluation of low back pain.  She states she has had intermittent low back pain for couple years.  No specific injury.  She occasionally takes Tylenol .  She will use patches in her lower back.  She has not worked with therapy.  No prior injections.  No issues with bowel or bladder function.  Occasional radiating pains into both legs, distal to the knee.  This is intermittent.   Review of Systems: No fevers or chills No numbness or tingling No chest pain No shortness of breath No bowel or bladder dysfunction No GI distress No headaches   Medical History:  Past Medical History:  Diagnosis Date   Hypertension     Past Surgical History:  Procedure Laterality Date   CESAREAN SECTION  1992   COLONOSCOPY WITH PROPOFOL  N/A 11/19/2020   Procedure: COLONOSCOPY WITH PROPOFOL ;  Surgeon: Eartha Angelia Sieving, MD;  Location: [facility];  Service: Gastroenterology;  Laterality: N/A;  AM   POLYPECTOMY  11/19/2020   Procedure: POLYPECTOMY;   Surgeon: Eartha Angelia, Sieving, MD;  Location: [facility];  Service: Gastroenterology;;    Family History  Problem Relation Age of Onset   Breast cancer Maternal Aunt    Social History   Tobacco Use   Smoking status: Some Days    Types: Cigarettes, Cigars   Smokeless tobacco: Never  Vaping Use   Vaping status: Never Used  Substance Use Topics   Alcohol use: Yes    Comment: ocass   Drug use: No    Allergies  Allergen Reactions   Phentermine  Nausea And Vomiting    Current Meds  Medication Sig   aspirin  EC 81 MG tablet Take 1 tablet (81 mg total) by mouth daily. Swallow whole.   Calcium  Carb-Cholecalciferol (CALTRATE 600+D3) 600-20 MG-MCG TABS Yake one tablet by mouth twice daily for bone and muscle health   EQ ALL DAY ALLERGY RELIEF 10 MG tablet Take 1 tablet by mouth once daily   fluticasone  (FLONASE  SENSIMIST) 27.5 MCG/SPRAY nasal spray Place 2 sprays into the nose daily.   rosuvastatin  (CRESTOR ) 5 MG tablet Take 1 tablet (5 mg total) by mouth daily.   triamterene -hydrochlorothiazide (MAXZIDE-25) 37.5-25 MG tablet Take 1 tablet by mouth once daily    Objective: BP 120/72   Pulse 65   Ht 5' 5 (1.651 m)   Wt 186 lb (84.4 kg)   LMP 01/24/2013   BMI 30.95 kg/m   Physical Exam:  General: Alert and oriented.  and No acute distress. Gait: Normal gait.  Tenderness palpation along the lower back.  Negative straight leg raise bilaterally.  Excellent lower body strength, 5.  Sensation intact throughout bilateral lower extremities.  2+ patellar tendon reflexes.  IMAGING: I personally ordered and reviewed the following images  Standing lumbar spine x-rays were obtained in clinic today.  No acute injuries are noted.  Mild anterolisthesis at L3-4, less than 25%.  Well-maintained disc height, with the exception of L4-5, where there is complete loss of disc height, and associated osteophytes.  No bony lesions.   Impression: Lumbar spine x-rays with mild anterolisthesis  at L3-4, and advanced degenerative changes at L4-5   New Medications:  No orders of the defined types were placed in this encounter.     Oneil DELENA Horde, MD  03/14/2024 11:22 AM

## 2024-04-05 ENCOUNTER — Other Ambulatory Visit: Payer: Self-pay | Admitting: Family Medicine

## 2024-05-29 ENCOUNTER — Other Ambulatory Visit: Payer: Self-pay | Admitting: Family Medicine

## 2024-06-15 LAB — CMP14+EGFR
ALT: 12 IU/L (ref 0–32)
AST: 19 IU/L (ref 0–40)
Albumin: 4.4 g/dL (ref 3.8–4.9)
Alkaline Phosphatase: 61 IU/L (ref 49–135)
BUN/Creatinine Ratio: 16 (ref 12–28)
BUN: 19 mg/dL (ref 8–27)
Bilirubin Total: 0.5 mg/dL (ref 0.0–1.2)
CO2: 22 mmol/L (ref 20–29)
Calcium: 9.7 mg/dL (ref 8.7–10.3)
Chloride: 102 mmol/L (ref 96–106)
Creatinine, Ser: 1.21 mg/dL — ABNORMAL HIGH (ref 0.57–1.00)
Globulin, Total: 2.7 g/dL (ref 1.5–4.5)
Glucose: 104 mg/dL — ABNORMAL HIGH (ref 70–99)
Potassium: 4.1 mmol/L (ref 3.5–5.2)
Sodium: 139 mmol/L (ref 134–144)
Total Protein: 7.1 g/dL (ref 6.0–8.5)
eGFR: 51 mL/min/1.73 — ABNORMAL LOW (ref >59)

## 2024-06-15 LAB — LIPID PANEL
Chol/HDL Ratio: 3.1 ratio (ref 0.0–4.4)
Cholesterol, Total: 182 mg/dL (ref 100–199)
HDL: 58 mg/dL (ref >39)
LDL Chol Calc (NIH): 107 mg/dL — ABNORMAL HIGH (ref 0–99)
Triglycerides: 96 mg/dL (ref 0–149)
VLDL Cholesterol Cal: 17 mg/dL (ref 5–40)

## 2024-06-15 LAB — CBC WITH DIFFERENTIAL/PLATELET
Basophils Absolute: 0.1 x10E3/uL (ref 0.0–0.2)
Basos: 1 %
EOS (ABSOLUTE): 0.3 x10E3/uL (ref 0.0–0.4)
Eos: 4 %
Hematocrit: 36.9 % (ref 34.0–46.6)
Hemoglobin: 12.1 g/dL (ref 11.1–15.9)
Immature Grans (Abs): 0 x10E3/uL (ref 0.0–0.1)
Immature Granulocytes: 0 %
Lymphocytes Absolute: 2.6 x10E3/uL (ref 0.7–3.1)
Lymphs: 41 %
MCH: 30.9 pg (ref 26.6–33.0)
MCHC: 32.8 g/dL (ref 31.5–35.7)
MCV: 94 fL (ref 79–97)
Monocytes Absolute: 0.4 x10E3/uL (ref 0.1–0.9)
Monocytes: 6 %
Neutrophils Absolute: 3 x10E3/uL (ref 1.4–7.0)
Neutrophils: 48 %
Platelets: 364 x10E3/uL (ref 150–450)
RBC: 3.92 x10E6/uL (ref 3.77–5.28)
RDW: 13.1 % (ref 11.7–15.4)
WBC: 6.3 x10E3/uL (ref 3.4–10.8)

## 2024-06-15 LAB — TSH: TSH: 2.82 u[IU]/mL (ref 0.450–4.500)

## 2024-06-15 LAB — HEMOGLOBIN A1C
Est. average glucose Bld gHb Est-mCnc: 120 mg/dL
Hgb A1c MFr Bld: 5.8 % — ABNORMAL HIGH (ref 4.8–5.6)

## 2024-06-15 LAB — VITAMIN D 25 HYDROXY (VIT D DEFICIENCY, FRACTURES): Vit D, 25-Hydroxy: 41.4 ng/mL (ref 30.0–100.0)

## 2024-06-22 ENCOUNTER — Ambulatory Visit: Admitting: Family Medicine

## 2024-06-22 ENCOUNTER — Encounter: Payer: Self-pay | Admitting: Family Medicine

## 2024-06-22 VITALS — BP 142/78 | HR 66 | Resp 18 | Ht 65.0 in | Wt 188.0 lb

## 2024-06-22 DIAGNOSIS — Z0001 Encounter for general adult medical examination with abnormal findings: Secondary | ICD-10-CM

## 2024-06-22 NOTE — Progress Notes (Signed)
 Complete physical exam  Patient: Grace Valdez   DOB: Sep 26, 1965   58 y.o. Female  MRN: [id number]  Subjective:    Chief Complaint  Patient presents with   Annual Exam    Cpe     Grace Valdez is a 58 y.o. female who presents today for a complete physical exam. She reports consuming a general diet. Walk daily She generally feels well. She reports sleeping well. She does not have additional problems to discuss today.    Most recent fall risk assessment:    06/22/2024   11:26 AM  Fall Risk   Falls in the past year? 0  Number falls in past yr: 0  Injury with Fall? 0  Follow up Falls evaluation completed     Most recent depression screenings:    06/22/2024   11:26 AM 02/24/2024   11:18 AM  PHQ 2/9 Scores  PHQ - 2 Score 0 0    Dental: No current dental problems and Last dental visit: 01/2024  Patient Active Problem List   Diagnosis Date Noted   Encounter for annual general medical examination with abnormal findings in adult 06/22/2024   Hip pain 02/24/2024   Trigger thumb, right thumb 09/13/2023   Back pain 09/13/2023   TIA (transient ischemic attack) 01/22/2023   Need for Tdap vaccination 07/07/2022   Habitual snoring 07/03/2021   Hyperlipidemia LDL goal <70 12/27/2014   Annual physical exam 03/10/2012   Vitamin D  deficiency 03/10/2012   Seasonal allergies 10/08/2011   IGT (impaired glucose tolerance) 10/08/2011   Obesity (BMI 30.0-34.9) 02/06/2010   Essential hypertension 12/16/2007   Past Medical History:  Diagnosis Date   Hypertension    Past Surgical History:  Procedure Laterality Date   CESAREAN SECTION  1992   COLONOSCOPY WITH PROPOFOL  N/A 11/19/2020   Procedure: COLONOSCOPY WITH PROPOFOL ;  Surgeon: Eartha Angelia Sieving, MD;  Location: [facility];  Service: Gastroenterology;  Laterality: N/A;  AM   POLYPECTOMY  11/19/2020   Procedure: POLYPECTOMY;  Surgeon: Eartha Angelia, Sieving, MD;  Location: [facility];  Service: Gastroenterology;;    Social History   Tobacco Use   Smoking status: Some Days    Types: Cigarettes, Cigars   Smokeless tobacco: Never  Vaping Use   Vaping status: Never Used  Substance Use Topics   Alcohol use: Yes    Comment: ocass   Drug use: No   Social History   Socioeconomic History   Marital status: Legally Separated    Spouse name: Not on file   Number of children: Not on file   Years of education: Not on file   Highest education level: Not on file  Occupational History   Not on file  Tobacco Use   Smoking status: Some Days    Types: Cigarettes, Cigars   Smokeless tobacco: Never  Vaping Use   Vaping status: Never Used  Substance and Sexual Activity   Alcohol use: Yes    Comment: ocass   Drug use: No   Sexual activity: Yes  Other Topics Concern   Not on file  Social History Narrative   Right handed   Lives alone   Caffeine- 1-2 cups daily   Social Drivers of Health   Financial Resource Strain: Not on file  Food Insecurity: Not on file  Transportation Needs: Not on file  Physical Activity: Not on file  Stress: Not on file  Social Connections: Not on file  Intimate Partner Violence: Not on file  Family Status  Relation Name Status   Mat Aunt  (Not Specified)  No partnership data on file   Family History  Problem Relation Age of Onset   Breast cancer Maternal Aunt    Allergies  Allergen Reactions   Phentermine  Nausea And Vomiting      Patient Care Team: Antonetta Rollene BRAVO, MD as PCP - General   Outpatient Medications Prior to Visit  Medication Sig   aspirin  EC 81 MG tablet Take 1 tablet (81 mg total) by mouth daily. Swallow whole.   Calcium  Carb-Cholecalciferol (CALTRATE 600+D3) 600-20 MG-MCG TABS Yake one tablet by mouth twice daily for bone and muscle health   EQ ALL DAY ALLERGY RELIEF 10 MG tablet Take 1 tablet by mouth once daily   fluticasone  (FLONASE  SENSIMIST) 27.5 MCG/SPRAY nasal spray Place 2 sprays into the nose daily.   rosuvastatin  (CRESTOR )  5 MG tablet Take 1 tablet (5 mg total) by mouth daily.   triamterene -hydrochlorothiazide (MAXZIDE-25) 37.5-25 MG tablet Take 1 tablet by mouth once daily   No facility-administered medications prior to visit.    Review of Systems  Constitutional:  Negative for chills, fever and malaise/fatigue.  HENT:  Negative for congestion and sinus pain.   Eyes:  Negative for pain, discharge and redness.  Respiratory:  Negative for cough, sputum production and shortness of breath.   Cardiovascular:  Negative for chest pain, palpitations, claudication and leg swelling.  Gastrointestinal:  Negative for diarrhea, heartburn, nausea and vomiting.  Genitourinary:  Negative for flank pain and frequency.  Musculoskeletal:  Negative for back pain and joint pain.  Skin:  Negative for itching.  Neurological:  Negative for dizziness, seizures and headaches.  Endo/Heme/Allergies:  Negative for environmental allergies.  Psychiatric/Behavioral:  Negative for memory loss. The patient does not have insomnia.        Objective:    BP (!) 142/78   Pulse 66   Resp 18   Ht 5' 5 (1.651 m)   Wt 188 lb (85.3 kg)   LMP 01/24/2013   SpO2 96%   BMI 31.28 kg/m  BP Readings from Last 3 Encounters:  06/22/24 (!) 142/78  03/14/24 120/72  02/24/24 139/84   Wt Readings from Last 3 Encounters:  06/22/24 188 lb (85.3 kg)  03/14/24 186 lb (84.4 kg)  02/24/24 183 lb 0.6 oz (83 kg)      Physical Exam HENT:     Head: Normocephalic.     Left Ear: External ear normal.     Mouth/Throat:     Mouth: Mucous membranes are moist.  Eyes:     Extraocular Movements: Extraocular movements intact.     Pupils: Pupils are equal, round, and reactive to light.  Cardiovascular:     Rate and Rhythm: Normal rate and regular rhythm.     Heart sounds: No murmur heard. Pulmonary:     Effort: Pulmonary effort is normal.     Breath sounds: Normal breath sounds.  Abdominal:     Palpations: Abdomen is soft.     Tenderness: There is  no right CVA tenderness or left CVA tenderness.  Musculoskeletal:     Right lower leg: No edema.     Left lower leg: No edema.  Skin:    Findings: No lesion or rash.  Neurological:     Mental Status: She is alert and oriented to person, place, and time.     GCS: GCS eye subscore is 4. GCS verbal subscore is 5. GCS motor subscore is 6.  Cranial Nerves: No facial asymmetry.     Sensory: No sensory deficit.     Motor: No weakness.     Coordination: Coordination normal. Finger-Nose-Finger Test normal.     Gait: Gait normal.  Psychiatric:        Judgment: Judgment normal.     No results found for any visits on 06/22/24. Last CBC Lab Results  Component Value Date   WBC 6.3 06/14/2024   HGB 12.1 06/14/2024   HCT 36.9 06/14/2024   MCV 94 06/14/2024   MCH 30.9 06/14/2024   RDW 13.1 06/14/2024   PLT 364 06/14/2024   Last metabolic panel Lab Results  Component Value Date   GLUCOSE 104 (H) 06/14/2024   NA 139 06/14/2024   K 4.1 06/14/2024   CL 102 06/14/2024   CO2 22 06/14/2024   BUN 19 06/14/2024   CREATININE 1.21 (H) 06/14/2024   EGFR 51 (L) 06/14/2024   CALCIUM  9.7 06/14/2024   PROT 7.1 06/14/2024   ALBUMIN 4.4 06/14/2024   LABGLOB 2.7 06/14/2024   AGRATIO 1.4 09/10/2022   BILITOT 0.5 06/14/2024   ALKPHOS 61 06/14/2024   AST 19 06/14/2024   ALT 12 06/14/2024   ANIONGAP 9 01/22/2023   Last lipids Lab Results  Component Value Date   CHOL 182 06/14/2024   HDL 58 06/14/2024   LDLCALC 107 (H) 06/14/2024   TRIG 96 06/14/2024   CHOLHDL 3.1 06/14/2024   Last hemoglobin A1c Lab Results  Component Value Date   HGBA1C 5.8 (H) 06/14/2024   Last thyroid  functions Lab Results  Component Value Date   TSH 2.820 06/14/2024   Last vitamin D  Lab Results  Component Value Date   VD25OH 41.4 06/14/2024   Last vitamin B12 and Folate No results found for: VITAMINB12, FOLATE      Assessment & Plan:    Routine Health Maintenance and Physical Exam  Immunization  History  Administered Date(s) Administered   Influenza,inj,Quad PF,6+ Mos 07/21/2018   Moderna Sars-Covid-2 Vaccination 01/17/2020, 02/15/2020   Td 05/06/2001   Tdap 03/10/2012, 07/07/2022    Health Maintenance  Topic Date Due   Pneumococcal Vaccine: 50+ Years (1 of 2 - PCV) Never done   Zoster Vaccines- Shingrix (1 of 2) Never done   COVID-19 Vaccine (3 - 2025-26 season) 05/15/2024   Influenza Vaccine  12/12/2024 (Originally 04/14/2024)   Colonoscopy  11/19/2025   Mammogram  01/09/2026   Cervical Cancer Screening (HPV/Pap Cotest)  07/08/2027   DTaP/Tdap/Td (4 - Td or Tdap) 07/07/2032   Hepatitis C Screening  Completed   HIV Screening  Completed   Hepatitis B Vaccines 19-59 Average Risk  Aged Out   HPV VACCINES  Aged Out   Meningococcal B Vaccine  Aged Out    Discussed health benefits of physical activity, and encouraged her to engage in regular exercise appropriate for her age and condition.  Encounter for annual general medical examination with abnormal findings in adult Assessment & Plan: Physical exam as documented Discussed heart-healthy diet  Encouraged to Exercise: If you are able: 30 -60 minutes a day ,4 days a week, or 150 minutes a week. The longer the better. Combine stretch, strength, and aerobic activities Encourage to eat whole Food, Plant Predominant Nutrition is highly recommended: Eat Plenty of vegetables, Mushrooms, fruits, Legumes, Whole Grains, Nuts, seeds in lieu of processed meats, processed snacks/pastries red meat, poultry, eggs.  Will f/u in 1 year for CPE      Note: This chart has been completed using Dragon  Medical Dictation software, and while attempts have been made to ensure accuracy, certain words and phrases may not be transcribed as intended.    No follow-ups on file.     Nahlia Hellmann  Z Bacchus, FNP

## 2024-06-22 NOTE — Patient Instructions (Addendum)
 I appreciate the opportunity to provide care to you today!    Follow up:  1 year cpe  Labs: next visit  Nonpharmacological Interventions for Sciatica: -Apply heat or cold therapy for pain relief. -Engage in gentle stretching and low-impact exercise (e.g., walking, yoga). -Maintain good posture and use proper body mechanics when lifting. -Use lumbar support and avoid prolonged sitting. -Maintain a healthy weight to reduce strain on the spine. -Consider physical therapy for strengthening and flexibility. -Get adequate rest and use supportive footwear.   For a Healthier YOU, I Recommend: Reducing your intake of sugar, sodium, carbohydrates, and saturated fats. Increasing your fiber intake by incorporating more whole grains, fruits, and vegetables into your meals. Setting healthy goals with a focus on lowering your consumption of carbs, sugar, and unhealthy fats. Adding variety to your diet by including a wide range of fruits and vegetables. Cutting back on soda and limiting processed foods as much as possible. Staying active: In addition to taking your weight loss medication, aim for at least 150 minutes of moderate-intensity physical activity each week for optimal results.   Please follow up if your symptoms worsen or fail to improve.   Please continue to a heart-healthy diet and increase your physical activities. Try to exercise for at least five days a week.    It was a pleasure to see you and I look forward to continuing to work together on your health and well-being. Please do not hesitate to call the office if you need care or have questions about your care.  In case of emergency, please visit the Emergency Department for urgent care, or contact our clinic at [phone] to schedule an appointment. We're here to help you!   Have a wonderful day and week. With Gratitude, Meade JENEANE Gerlach MSN, FNP-BC, PMHNP-BC

## 2024-06-22 NOTE — Assessment & Plan Note (Signed)

## 2024-06-24 ENCOUNTER — Other Ambulatory Visit: Payer: Self-pay | Admitting: Family Medicine

## 2024-08-05 ENCOUNTER — Other Ambulatory Visit: Payer: Self-pay | Admitting: Family Medicine

## 2024-08-23 ENCOUNTER — Other Ambulatory Visit (HOSPITAL_COMMUNITY): Payer: Self-pay | Admitting: Family Medicine

## 2024-08-23 DIAGNOSIS — R928 Other abnormal and inconclusive findings on diagnostic imaging of breast: Secondary | ICD-10-CM

## 2024-09-26 ENCOUNTER — Other Ambulatory Visit (HOSPITAL_COMMUNITY): Payer: Self-pay | Admitting: Family Medicine

## 2024-09-26 ENCOUNTER — Encounter (HOSPITAL_COMMUNITY): Payer: Self-pay

## 2024-09-26 ENCOUNTER — Ambulatory Visit (HOSPITAL_COMMUNITY)
Admission: RE | Admit: 2024-09-26 | Discharge: 2024-09-26 | Disposition: A | Discharge status: Home / Self Care | Source: Ambulatory Visit | Attending: Family Medicine | Admitting: Family Medicine

## 2024-09-26 ENCOUNTER — Ambulatory Visit (HOSPITAL_COMMUNITY): Admission: RE | Admit: 2024-09-26 | Discharge: 2024-09-26 | Attending: Family Medicine | Admitting: Family Medicine

## 2024-09-26 DIAGNOSIS — R928 Other abnormal and inconclusive findings on diagnostic imaging of breast: Secondary | ICD-10-CM | POA: Diagnosis present

## 2024-10-03 ENCOUNTER — Other Ambulatory Visit (HOSPITAL_COMMUNITY): Payer: Self-pay | Admitting: Family Medicine

## 2024-10-03 ENCOUNTER — Ambulatory Visit (HOSPITAL_COMMUNITY)
Admission: RE | Admit: 2024-10-03 | Discharge: 2024-10-03 | Disposition: A | Source: Ambulatory Visit | Attending: Family Medicine | Admitting: Family Medicine

## 2024-10-03 ENCOUNTER — Encounter (HOSPITAL_COMMUNITY): Payer: Self-pay

## 2024-10-03 DIAGNOSIS — R928 Other abnormal and inconclusive findings on diagnostic imaging of breast: Secondary | ICD-10-CM

## 2024-10-03 DIAGNOSIS — N6001 Solitary cyst of right breast: Secondary | ICD-10-CM | POA: Diagnosis not present

## 2024-10-03 MED ORDER — LIDOCAINE-EPINEPHRINE (PF) 1 %-1:200000 IJ SOLN
30.0000 mL | Freq: Once | INTRAMUSCULAR | Status: AC
  Administered 2024-10-03: 0.5 mL via INTRADERMAL

## 2024-10-03 MED ORDER — LIDOCAINE HCL (PF) 2 % IJ SOLN
10.0000 mL | Freq: Once | INTRAMUSCULAR | Status: AC
  Administered 2024-10-03: 10 mL via INTRADERMAL

## 2024-10-03 MED ORDER — LIDOCAINE HCL (PF) 2 % IJ SOLN
INTRAMUSCULAR | Status: AC
  Filled 2024-10-03: qty 10

## 2024-10-03 NOTE — Progress Notes (Signed)
 Patient brought to US  Rm3 in no obvious distress. Right breast aspiration explained. Consent obtained. Prepped and draped with sterile techniques. US  imaging obtained. Local anesthetic admin without complication. Access to to fluid cyst obtained and aspirated. Tolerated well. Access withdrawn, site bandaged. Discussed DC issuesrelatedto limiting lifting to objects under 5lbs for 24h, use of ice packs to control bruising/swelling, and tylenol  for any occurrence of discomfort. Escorted to mammogram dept for further imaging.

## 2024-10-10 ENCOUNTER — Other Ambulatory Visit: Payer: Self-pay | Admitting: Family Medicine

## 2024-12-22 ENCOUNTER — Ambulatory Visit: Payer: Self-pay | Admitting: Family Medicine

## 2025-06-19 ENCOUNTER — Ambulatory Visit: Admitting: Family Medicine
